# Patient Record
Sex: Male | Born: 1980
Health system: Southern US, Community
[De-identification: ages and names within clinical notes are randomized; demographics above are authoritative.]

## PROBLEM LIST (undated history)

## (undated) DIAGNOSIS — G4733 Obstructive sleep apnea (adult) (pediatric): Secondary | ICD-10-CM

## (undated) DIAGNOSIS — I10 Essential (primary) hypertension: Secondary | ICD-10-CM

## (undated) HISTORY — DX: Essential (primary) hypertension: I10

## (undated) HISTORY — PX: HIP SURGERY: SHX245

## (undated) HISTORY — DX: Obstructive sleep apnea (adult) (pediatric): G47.33

---

## 2000-11-27 ENCOUNTER — Emergency Department (HOSPITAL_COMMUNITY): Admission: EM | Admit: 2000-11-27 | Discharge: 2000-11-27 | Payer: Self-pay | Admitting: Emergency Medicine

## 2000-11-27 ENCOUNTER — Encounter: Payer: Self-pay | Admitting: Emergency Medicine

## 2004-05-18 ENCOUNTER — Emergency Department (HOSPITAL_COMMUNITY): Admission: EM | Admit: 2004-05-18 | Discharge: 2004-05-18 | Payer: Self-pay | Admitting: Emergency Medicine

## 2005-07-08 ENCOUNTER — Emergency Department (HOSPITAL_COMMUNITY): Admission: EM | Admit: 2005-07-08 | Discharge: 2005-07-08 | Payer: Self-pay | Admitting: Family Medicine

## 2006-01-09 ENCOUNTER — Ambulatory Visit: Payer: Self-pay | Admitting: Family Medicine

## 2007-11-21 ENCOUNTER — Emergency Department (HOSPITAL_COMMUNITY): Admission: EM | Admit: 2007-11-21 | Discharge: 2007-11-21 | Payer: Self-pay | Admitting: Emergency Medicine

## 2008-02-01 ENCOUNTER — Emergency Department (HOSPITAL_COMMUNITY): Admission: EM | Admit: 2008-02-01 | Discharge: 2008-02-01 | Payer: Self-pay | Admitting: Emergency Medicine

## 2008-02-03 ENCOUNTER — Emergency Department (HOSPITAL_COMMUNITY): Admission: EM | Admit: 2008-02-03 | Discharge: 2008-02-03 | Payer: Self-pay | Admitting: Family Medicine

## 2008-02-07 ENCOUNTER — Observation Stay (HOSPITAL_COMMUNITY): Admission: EM | Admit: 2008-02-07 | Discharge: 2008-02-09 | Payer: Self-pay | Admitting: Emergency Medicine

## 2008-10-26 ENCOUNTER — Emergency Department (HOSPITAL_COMMUNITY): Admission: EM | Admit: 2008-10-26 | Discharge: 2008-10-26 | Payer: Self-pay | Admitting: Emergency Medicine

## 2010-11-09 NOTE — H&P (Signed)
NAMEDEFORREST, BOGLE                 ACCOUNT NO.:  [id number]   MEDICAL RECORD NO.:  [id number]          PATIENT TYPE:  EMS   LOCATION:  ED                           FACILITY:  [facility]   PHYSICIAN:  Michelene Gardener, MD    DATE OF BIRTH:  May 19, 1981   DATE OF ADMISSION:  02/07/2008  DATE OF DISCHARGE:                              HISTORY & PHYSICAL   PRIMARY PHYSICIAN:  The patient is unassigned.   CHIEF COMPLAINT:  Increasing nausea, vomiting, sore throat and fever.   HISTORY OF PRESENT ILLNESS:  This is a 30 year old Caucasian male with  past medical history of recurrent strep pharyngitis who presented with  the above-mentioned complaint.  This patient has had recurrent strep  pharyngitis, and he gets strep almost every year.  The last time he was  diagnosed with strep was in May, and at that time he was treated with  penicillin and Phenergan, and he improved.  He started to have a sore  throat and fever and he went to urgent care on August 7.  At that time,  a strep test was done and it came to be negative.  He was given  penicillin, but he did not improve very well.  He came back on August 9,  where he had another strep test done, and that came to be negative.  Flagyl was added to his medications.  He did not tolerate Flagyl very  well, and he had nausea and vomiting with it.  For the last 2 days, he  has felt more febrile although he did not check his temperature.  He got  a lot of nausea and vomiting.  In the ER, his temperature was 100.5.  His white count was elevated at 21.3, and his potassium was low at 2.8.  A CT scan of his neck was done and it showed bilateral tonsillar  inflammation with low-density area compatible with phlegmon but no  definite abscess.  It also showed reactive adenopathy.   PAST MEDICAL HISTORY:  Significant for:  1. Recurrent pharyngitis and tonsillitis.  2. Obesity.  3. Tobacco abuse.   PAST SURGICAL HISTORY:  He had history of hip pinning at age  51.   ALLERGIES:  No known drug allergies.   CURRENT MEDICATIONS:  The patient does not take regular medications.  He  is only on Flagyl,  Phenergan and penicillin.  Those were started  recently for his sore throat.   SOCIAL HISTORY:  He smokes 1/2 pack a day and he has been smoking since  30 years old.  He denied alcohol drinking, and he denied recreational  drugs.   FAMILY HISTORY:  Noncontributory.   REVIEW OF SYSTEMS:  CONSTITUTIONAL:  Positive for fever on and off  associated with  fatigability.  EYES:  No blurred vision.  ENT:  No  tinnitus. RESPIRATORY:  Positive for occasional cough.  There are no  wheezes.  CARDIOVASCULAR:  No chest pain, no shortness of breath.  GASTROINTESTINAL:  Positive for nausea and vomiting.  There is no  abdominal pain.  There is no diarrhea.  GENITOURINARY:  No dysuria, no  hematuria.  ENDOCRINE:  No  polyuria, no nocturia.  HEMATOLOGIC:  No  bruises or bleeding.  ID:  No rash, no lesions.  NEURO:  No numbness, no  tingling.  The rest of the systems were reviewed and they were negative.   PHYSICAL EXAMINATION:  VITAL SIGNS:  Temperature is 100.5, blood  pressure 132/60, pulse 100, respiratory rate 20.  GENERAL APPEARANCE:  This is a young Caucasian male, not in acute  distress.  HEENT:  His conjunctivae are pink.  His pupils are equal and reactive to  light.  There is no ptosis.  Hearing is intact.  There is no ear  discharge or infection.  There is no nose infection or bleeding.  Oral  mucosa is dry.  There is mild pharyngeal erythema.  NECK:  Supple.  There is no JVD.  There is no carotid bruit.  There is  mild tenderness in his neck area.  CARDIOVASCULAR:  S1 and S2 are regular.  There are no murmurs.  RESPIRATORY:  He is breathing at 16 to 18.  There are no rales, no  rhonchi, and no wheezes.  ABDOMEN:  The abdomen is soft, nondistended.  No tenderness.  No  hepatosplenomegaly.  Bowel sounds are present.  EXTREMITIES:  Lower extremities  with no edema, no rash and no varicose  veins.  SKIN:  No rash and no erythema.  NEUROLOGIC:  Cranial nerves are intact from II-XII.  There are no motor  or sensory deficits.   LABORATORY RESULTS:  Sodium 135, potassium 2.8, chloride 99, bicarb 27,  glucose 140, BUN 7, creatinine 0.5, calcium 8.9, AST 23, ALT 27, WBC  21.3, hemoglobin 14.9, hematocrit 43.7, MCV 84, platelet count is 288.  A CT scan of the neck showed bilateral tonsil inflammation with small  low-density areas compatible with phlegmon, and there was no drainable  abscess.  There was bilateral reactive cervical adenopathy.   IMPRESSION:  1. Recurrent tonsillar infection with reactive adenopathy.  2. Leukocytosis secondary to number one in addition to dehydration.  3. Nausea and vomiting.  4. Hypokalemia secondary to vomiting.  5. Obesity.  6. Tobacco abuse.   PLAN:  1. Recurrent tonsillar infection.  This patient has recurrent strep      pharyngitis and tonsillitis, and he says he gets it almost once or      twice every year.  He had 3 strep tests done on May 27, August 7      and August 9 of this year, and all of them were negative.  Monospot      test was also negative.  His CT scan showed tonsillar infection      with phlegmon and no drainable abscess.  It also showed reactive      cervical adenopathy.  The patient not able to keep antibiotics down      at this time because of persistent vomiting.  He was treated      recently with penicillin and Flagyl, and I am not quite sure why he      was given Flagyl for his sore throat.  While in the hospital, I      will keep him on Unasyn IV.  I will send blood cultures x2.  He      stated that his urine color has been different, so I will get a      urinalysis and urine culture.  I will also send for H1N1 swab, and  meanwhile I will keep him in droplet isolation.  2. Leukocytosis.  This is most likely a combination of his current      infection in addition to  dehydration, and that will be treated with      both antibiotics and IV fluids, and we will repeat his white count      tomorrow.  3. Nausea and vomiting.  He is improving, so I will start him on a      liquid diet and then I will advance as needed.  I will also      continue symptomatic treatment with IV fluids and antiemetics.  4. Hypokalemia.  This is secondary to his vomiting.  I will give him      potassium and we will check magnesium and potassium level.  5. Tobacco abuse.  The patient was advised to quit.  I will put him on      a nicotine patch while in the hospital.   DISPOSITION:  This patient has recurrent tonsillitis, and he might  benefit from an ENT evaluation, and that can be done as an outpatient.   TOTAL ASSESSMENT TIME:  Fifty minutes.      Michelene Gardener, MD  Electronically Signed     NAE/MEDQ  D:  02/07/2008  T:  02/07/2008  Job:  [id number]

## 2010-11-12 NOTE — Assessment & Plan Note (Signed)
[facility]                             [facility] OFFICE NOTE   NAME:Troy Peterson, Troy Peterson                        MRN:          [id number]  DATE:01/09/2006                            DOB:          12-25-1980    A 30 year old white male new to the practice, here to establish.  Has  history of bad headaches which come on later in the day usually,  occasionally wakes up with them.  Sometimes they are in the back of the  head, sometimes in the front.  Can tell sometimes when they are coming on  but usually has no symptoms except for the headache itself.  Denies  photophobia.  Occasionally has nausea.  No odor intensity is noted.  Typically uses two Excedrin which allows them to go away.  Sometimes he goes  home and goes to bed.  He gets them about two or three times a week.   A second problem is bad indigestion all the time.  A third problem is he may  have had a tick bite on the back.  He was a patient of Dr. Charmian Muff in the  past, has not been seen in awhile.  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   PAST MEDICAL HISTORY:  Was admitted to the hospital for a week in the  seventh or eighth grade for having a hip problem with a pin placed for  frequent dislocations.  He denies rheumatic fever or scarlet fever.  No  hypertension, heart disease, asthma, pneumonia, diabetes, liver disease or  thyroid disease.  He has had strep fairly frequently.   ALLERGIES:  HE HAS NO KNOWN DRUG ALLERGIES.   Smokes a pack a day and has done so since 30 years of age.  He does not  drink or use drugs.  He is on no medicines regularly.   REVIEW OF SYSTEMS:  Significant for headaches two or three times a week,  occasional ringing of the ears.  His last abdominal exam was last month.  May have had an ulcer in the past, although Dr. Scotty Court did not think so.  He was having severe symptoms for awhile.  He has reflux disease and we  discussed prophylaxis significantly.  He has  three tattoos on his right  biceps that were done professionally.  Has rare nocturia.  Otherwise, head,  eyes, ears, nose and throat, teeth and gums, cardiorespiratory,  gastrointestinal and genitourinary systems are noncontributory.   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  Married since September 2001 with two children and one on  the way.   FAMILY HISTORY:  Father died at the age of 52 from a motor vehicle accident.  Mother is still alive at the age of 60.  One sister alive at 48.  Maternal  grandfather had prostate cancer and alcohol abuse.  There is no coronary  artery disease, high blood pressure, diabetes, cancer otherwise, depression  or stroke in the family.   His last tetanus shot is unknown.   PHYSICAL EXAMINATION:  He is a well-developed, well-nourished, 30 year old  white male in no acute distress.  Temperature of 97.9, pulse of 76, blood  pressure of 120/70, weight of 328, 74-1/2 inches, with no known drug  allergies.  Mildly obese.  HEENT:  Within normal limits.  NECK:  Without adenopathy.  THYROID:  Without nodularity.  LUNGS:  Clear to auscultation.  BACK:  Straight and nontender with no CVA tenderness.  HEART:  Regular rate and rhythm without murmur.  Pulses are 2+ throughout.  Carotids are without bruits.  CHEST:  Symmetrical with good excursion.  ABDOMEN:  Soft, nontender, normoactive bowel sounds, no masses.  Muscle strength is 5/5.  Range of motion, gait and mobility are normal.  EXTREMITIES:  Without clubbing, cyanosis or edema.  SKIN:  Significant for a papule with a foreign body in the middle of it  which I probed with a sharp instrument but could not get the foreign body  out.  I left it open to heal by secondary intention.   ASSESSMENT:  Health maintenance physical exam with obesity, tick bite at  site on the back with possible foreign body retained, headaches, possibly  stress versus migraine,  reflux disease, and nicotine abuse.   PLAN:  The bite site was probed.  I was unsuccessful in removing the foreign  body.  I put antibiotic cream and a sterile dressing over it.  Allowed him  to leave.  Told him if it swells or gets inflamed to come back.  Would like  to see him for next headache.  We discussed prophylaxis of his reflux  disease to include diet and weight loss.  Encouraged him at great length to  stop smoking.  Will give him a tetanus shot next time.  Return as needed.                                   Arta Silence, MD   RNS/MedQ  DD:  01/10/2006  DT:  01/10/2006  Job #:  [id number]

## 2011-03-23 LAB — POCT RAPID STREP A: Streptococcus, Group A Screen (Direct): NEGATIVE

## 2011-03-25 LAB — COMPREHENSIVE METABOLIC PANEL
ALT: 27
AST: 23
Albumin: 3.4 — ABNORMAL LOW
Alkaline Phosphatase: 66
BUN: 7
CO2: 27
Calcium: 8.9
Chloride: 99
Creatinine, Ser: 0.9
GFR calc Af Amer: >60
GFR calc non Af Amer: >60
Glucose, Bld: 140 — ABNORMAL HIGH
Potassium: 2.8 — ABNORMAL LOW
Sodium: 135
Total Bilirubin: 0.9
Total Protein: 7.1

## 2011-03-25 LAB — MAGNESIUM: Magnesium: 2.1

## 2011-03-25 LAB — DIFFERENTIAL
Basophils Absolute: 0.2 — ABNORMAL HIGH
Basophils Relative: 1
Eosinophils Absolute: 0.1
Eosinophils Relative: 0
Lymphocytes Relative: 14

## 2011-03-25 LAB — CBC
HCT: 43.7
Hemoglobin: 12.5 — ABNORMAL LOW
Hemoglobin: 14.9
MCHC: 34.1
MCHC: 34.1
MCV: 84
Platelets: 288
RBC: 4.36
RBC: 5.2
RDW: 13
WBC: 19.6 — ABNORMAL HIGH
WBC: 21.3 — ABNORMAL HIGH

## 2011-03-25 LAB — URINALYSIS, ROUTINE W REFLEX MICROSCOPIC
Bilirubin Urine: NEGATIVE
Glucose, UA: NEGATIVE
Hgb urine dipstick: NEGATIVE
Specific Gravity, Urine: >1.046 — ABNORMAL HIGH
pH: 6

## 2011-03-25 LAB — URINE CULTURE: Special Requests: NEGATIVE

## 2011-03-25 LAB — MONONUCLEOSIS SCREEN: Mono Screen: NEGATIVE

## 2011-03-25 LAB — CULTURE, BLOOD (ROUTINE X 2)

## 2011-03-25 LAB — BASIC METABOLIC PANEL
CO2: 31
Calcium: 8.6
Chloride: 101
GFR calc Af Amer: >60
Sodium: 137

## 2011-03-25 LAB — PHOSPHORUS: Phosphorus: 3.6

## 2011-03-25 LAB — POCT RAPID STREP A: Streptococcus, Group A Screen (Direct): NEGATIVE

## 2011-03-25 LAB — URINE MICROSCOPIC-ADD ON

## 2012-09-29 ENCOUNTER — Ambulatory Visit: Payer: Self-pay | Admitting: Physician Assistant

## 2013-01-23 ENCOUNTER — Ambulatory Visit (INDEPENDENT_AMBULATORY_CARE_PROVIDER_SITE_OTHER): Payer: 59 | Admitting: Family Medicine

## 2013-01-23 ENCOUNTER — Encounter: Payer: Self-pay | Admitting: Family Medicine

## 2013-01-23 VITALS — BP 138/80 | HR 76 | Temp 98.4°F | Ht 73.75 in | Wt 347.0 lb

## 2013-01-23 DIAGNOSIS — L739 Follicular disorder, unspecified: Secondary | ICD-10-CM

## 2013-01-23 DIAGNOSIS — L738 Other specified follicular disorders: Secondary | ICD-10-CM

## 2013-01-23 MED ORDER — DOXYCYCLINE HYCLATE 100 MG PO TABS: 100.0000 mg | ORAL_TABLET | Freq: Two times a day (BID) | ORAL | Status: DC

## 2013-01-23 NOTE — Progress Notes (Signed)
Lump on back of neck for about 2 days. Tender today if he presses on it.  No FCNAVD.  Feels well o/w.   Meds, vitals, and allergies reviewed.   ROS: See HPI.  Otherwise, noncontributory.  nad ncat Mmm rrr ctab Small area on back of neck with tenderness but not red.  Peripherally with folliculitis lesions noted.

## 2013-01-23 NOTE — Assessment & Plan Note (Signed)
With possible LA vs seb cyst vs very early abscess.  Doesn't need I&D now.  Start doxy, use warm compresses, call back if enlarging and needs reeval.  He agrees.

## 2013-01-23 NOTE — Patient Instructions (Addendum)
Start the antibiotics today and be careful about sun exposures.  Use a warm compress. If this isn't improving then let me know.  Take care.

## 2013-02-06 ENCOUNTER — Encounter: Payer: Self-pay | Admitting: Family Medicine

## 2013-02-06 ENCOUNTER — Ambulatory Visit (INDEPENDENT_AMBULATORY_CARE_PROVIDER_SITE_OTHER): Payer: 59 | Admitting: Family Medicine

## 2013-02-06 VITALS — BP 124/78 | HR 68 | Temp 98.1°F | Wt 350.5 lb

## 2013-02-06 DIAGNOSIS — Z13220 Encounter for screening for lipoid disorders: Secondary | ICD-10-CM

## 2013-02-06 DIAGNOSIS — L738 Other specified follicular disorders: Secondary | ICD-10-CM

## 2013-02-06 DIAGNOSIS — L739 Follicular disorder, unspecified: Secondary | ICD-10-CM

## 2013-02-06 DIAGNOSIS — Z131 Encounter for screening for diabetes mellitus: Secondary | ICD-10-CM

## 2013-02-06 DIAGNOSIS — Z23 Encounter for immunization: Secondary | ICD-10-CM

## 2013-02-06 NOTE — Progress Notes (Signed)
Was ~280 in HS, weight increased after stopping smoking.  Trucker.  Some meals on the road, "can go all day w/o eating."  Schedule if variable.  Disorder meal times.    Neck improved on doxy.  Area didn't drain.  Not ttp.  No fevers.    Due for tdap. Discussed.  Encouraged flu shot.   PMH and SH reviewed  ROS: See HPI, otherwise noncontributory.  Meds, vitals, and allergies reviewed.   GEN: nad, alert and oriented, overweight HEENT: mucous membranes moist NECK: supple w/o LA, normal inspection and palpation at prev infection site except for one area of nontender skin thickening that doesn't fell like a fluctuant mass CV: rrr.  PULM: ctab, no inc wob ABD: soft, +bs EXT: no edema SKIN: no acute rash

## 2013-02-06 NOTE — Patient Instructions (Addendum)
Come back for fasting labs.  I would get a flu shot each fall.   Try to get started on a regular meal plan.   Take care.  Call with questions.  Glad to see you.

## 2013-02-07 DIAGNOSIS — Z23 Encounter for immunization: Secondary | ICD-10-CM | POA: Insufficient documentation

## 2013-02-07 NOTE — Assessment & Plan Note (Signed)
D/w pt about weight loss via diet and exercise.  Needs to work on type and timing of food consumption.  Current with disorganized meal schedule.  He agrees.  Declined nutrition eval.  Call back as needed.  BP okay today.

## 2013-02-07 NOTE — Assessment & Plan Note (Signed)
Done today.

## 2013-02-07 NOTE — Assessment & Plan Note (Signed)
Resolved

## 2013-02-12 ENCOUNTER — Other Ambulatory Visit (INDEPENDENT_AMBULATORY_CARE_PROVIDER_SITE_OTHER): Payer: 59

## 2013-02-12 DIAGNOSIS — Z13220 Encounter for screening for lipoid disorders: Secondary | ICD-10-CM

## 2013-02-12 DIAGNOSIS — Z131 Encounter for screening for diabetes mellitus: Secondary | ICD-10-CM

## 2013-02-12 LAB — LIPID PANEL
Cholesterol: 152 mg/dL (ref 0–200)
VLDL: 21.8 mg/dL (ref 0.0–40.0)

## 2013-02-12 LAB — GLUCOSE, RANDOM: Glucose, Bld: 92 mg/dL (ref 70–99)

## 2013-02-13 ENCOUNTER — Ambulatory Visit: Payer: Self-pay | Admitting: Family Medicine

## 2013-02-13 ENCOUNTER — Encounter: Payer: Self-pay | Admitting: Family Medicine

## 2013-02-20 ENCOUNTER — Other Ambulatory Visit: Payer: 59

## 2014-01-22 ENCOUNTER — Ambulatory Visit: Payer: 59 | Admitting: Family Medicine

## 2014-01-22 ENCOUNTER — Ambulatory Visit (INDEPENDENT_AMBULATORY_CARE_PROVIDER_SITE_OTHER): Payer: 59 | Admitting: Family Medicine

## 2014-01-22 ENCOUNTER — Encounter: Payer: Self-pay | Admitting: Family Medicine

## 2014-01-22 VITALS — BP 158/84 | HR 76 | Temp 97.9°F | Wt 332.5 lb

## 2014-01-22 DIAGNOSIS — I10 Essential (primary) hypertension: Secondary | ICD-10-CM | POA: Insufficient documentation

## 2014-01-22 MED ORDER — LISINOPRIL 10 MG PO TABS
10.0000 mg | ORAL_TABLET | Freq: Every day | ORAL | Status: DC
Start: 2014-01-22 — End: 2015-05-25

## 2014-01-22 NOTE — Patient Instructions (Addendum)
[facility] at the [facility] diet and [email or web address].   Go to the lab on the way out.  We'll contact you with your lab report.  Start 10mg  of lisinopril a day and update me next week about your pressure.   Take care.

## 2014-01-22 NOTE — Progress Notes (Signed)
Pre visit review using our clinic review tool, if applicable. No additional management support is needed unless otherwise documented below in the visit note.  He has a new job pending.  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  He admits he was worried about this.  He tried cutting out salt recently.  He had lost some weight, some dietary changes.    Hypertension:    Using medication without problems or lightheadedness: yes Chest pain with exertion:no Edema:no Short of breath: with high exertion Average home BPs: elevated on outside checks, see above.    Meds, vitals, and allergies reviewed.   PMH and SH reviewed  ROS: See HPI.  Otherwise negative.    GEN: nad, alert and oriented HEENT: mucous membranes moist NECK: supple w/o LA CV: rrr. PULM: ctab, no inc wob EXT: no edema SKIN: no acute rash

## 2014-01-22 NOTE — Assessment & Plan Note (Signed)
He'll work on weight.  Add on lisinopril today, report back with BP in a few days.  Check BMET today.  He agrees. D/w pt about diet and exercise.

## 2014-01-23 ENCOUNTER — Telehealth: Payer: Self-pay | Admitting: Family Medicine

## 2014-01-23 LAB — BASIC METABOLIC PANEL
BUN: 20 mg/dL (ref 6–23)
CALCIUM: 9.7 mg/dL (ref 8.4–10.5)
CO2: 28 mEq/L (ref 19–32)
CREATININE: 0.9 mg/dL (ref 0.4–1.5)
Chloride: 99 mEq/L (ref 96–112)
GFR: 100.82 mL/min (ref >60.00)
Glucose, Bld: 108 mg/dL — ABNORMAL HIGH (ref 70–99)
Potassium: 4.4 mEq/L (ref 3.5–5.1)
Sodium: 134 mEq/L — ABNORMAL LOW (ref 135–145)

## 2014-01-23 NOTE — Telephone Encounter (Signed)
Left detailed message on voicemail.  

## 2014-01-23 NOTE — Telephone Encounter (Signed)
Patient says his BP is 118/64.  Patient took 1 tablet yesterday and 1 tablet this morning.  Dr. Para Marchuncan advised to cut Lisinopril in half daily and if symptoms are not improved, call for an appt.

## 2014-01-23 NOTE — Telephone Encounter (Signed)
Have him check his BP and notify us with the numbers.  Get on the schedule tomorrow if sx continue.  Thanks.

## 2014-01-23 NOTE — Telephone Encounter (Signed)
Caller: Oda/Patient; Phone: [phone]; Reason for Call: Recently started Lisinopril for HTN.  Patient reports feeling dizziness at times as well as chest pain at times.  Reports dizziness resolves itself within a few minutes.  Has also had a decreased appetite throughout the day due to nausea.  Please contact him with further instructions.

## 2014-12-16 IMAGING — CR DG ANKLE COMPLETE 3+V*L*
1 series · 5 of 5 positions shown · non-contrast
Comparison: none

REASON FOR EXAM: injury yesterday pain lt  foot and ankle
COMMENTS:

PROCEDURE:     DXR - DXR ANKLE LEFT COMPLETE  - September 29, 2012 [DATE]
RESULT:     Five views of the left ankle reveal the joint mortise to be
preserved. The talar dome is intact. There is no evidence of an acute
malleolar fracture. The overlying soft tissues are normal in appearance.

[Series 1: x ankle ap left · 0.14mm/px · 5 of 5 slices shown]
[im 1/5]
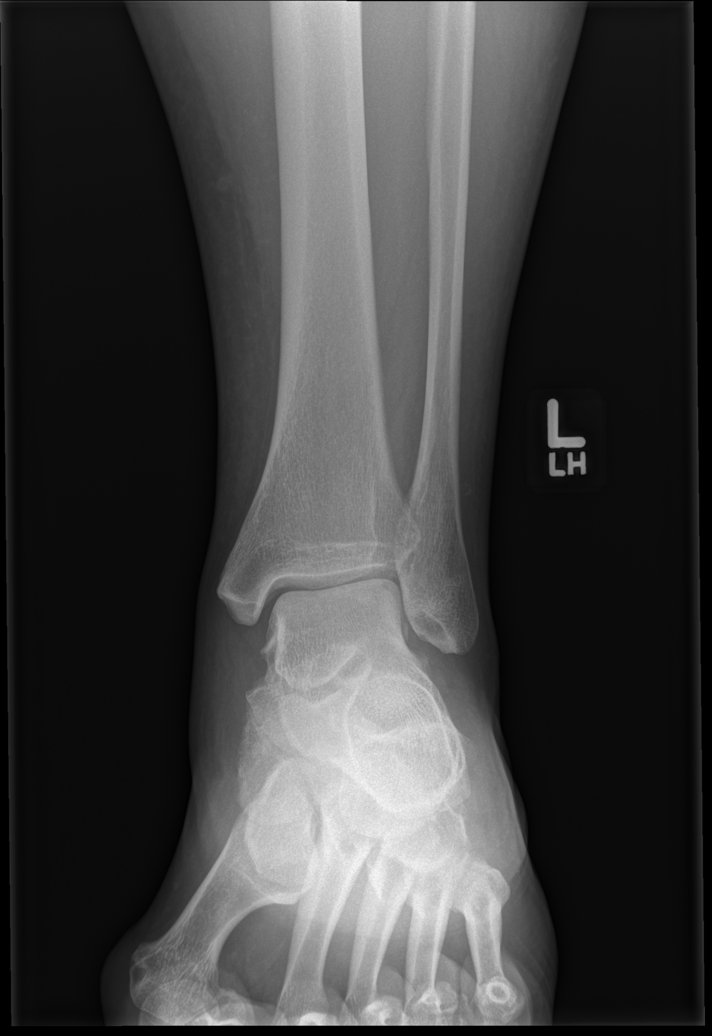
[im 2/5]
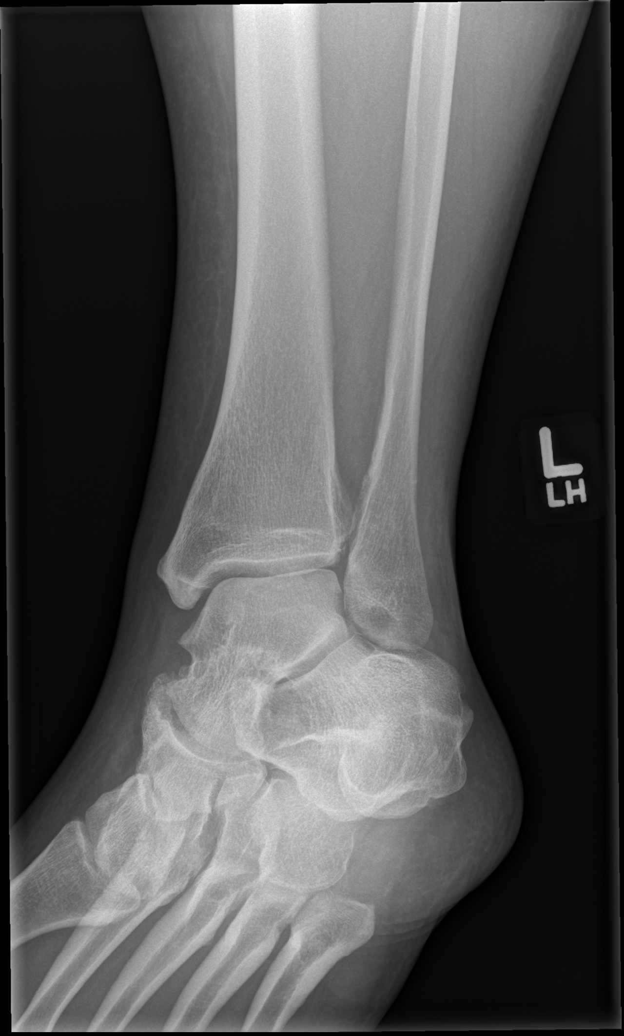
[im 3/5]
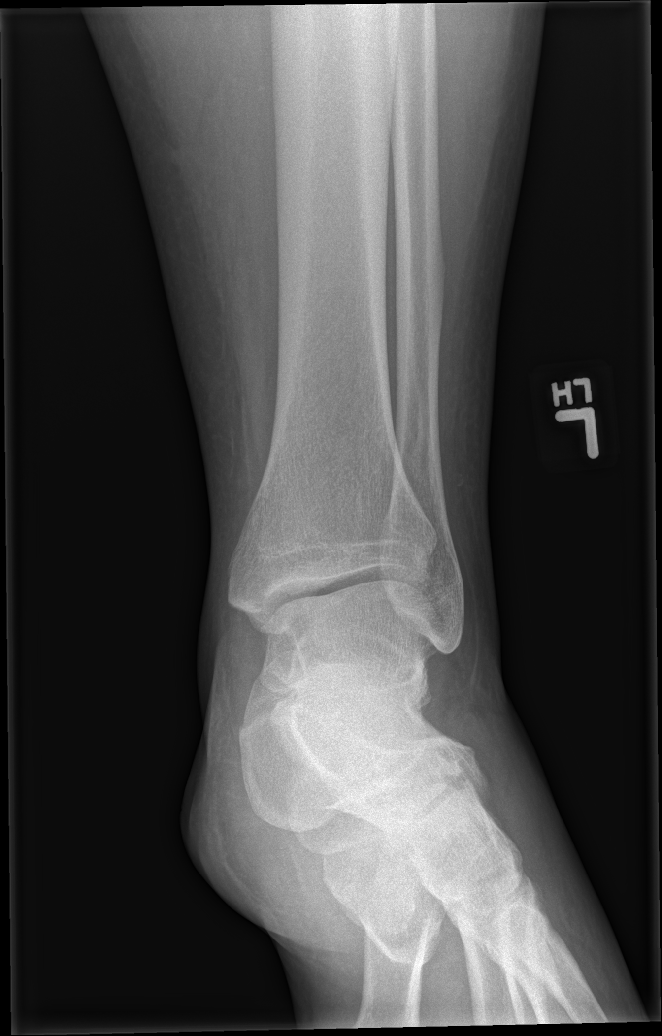
[im 4/5]
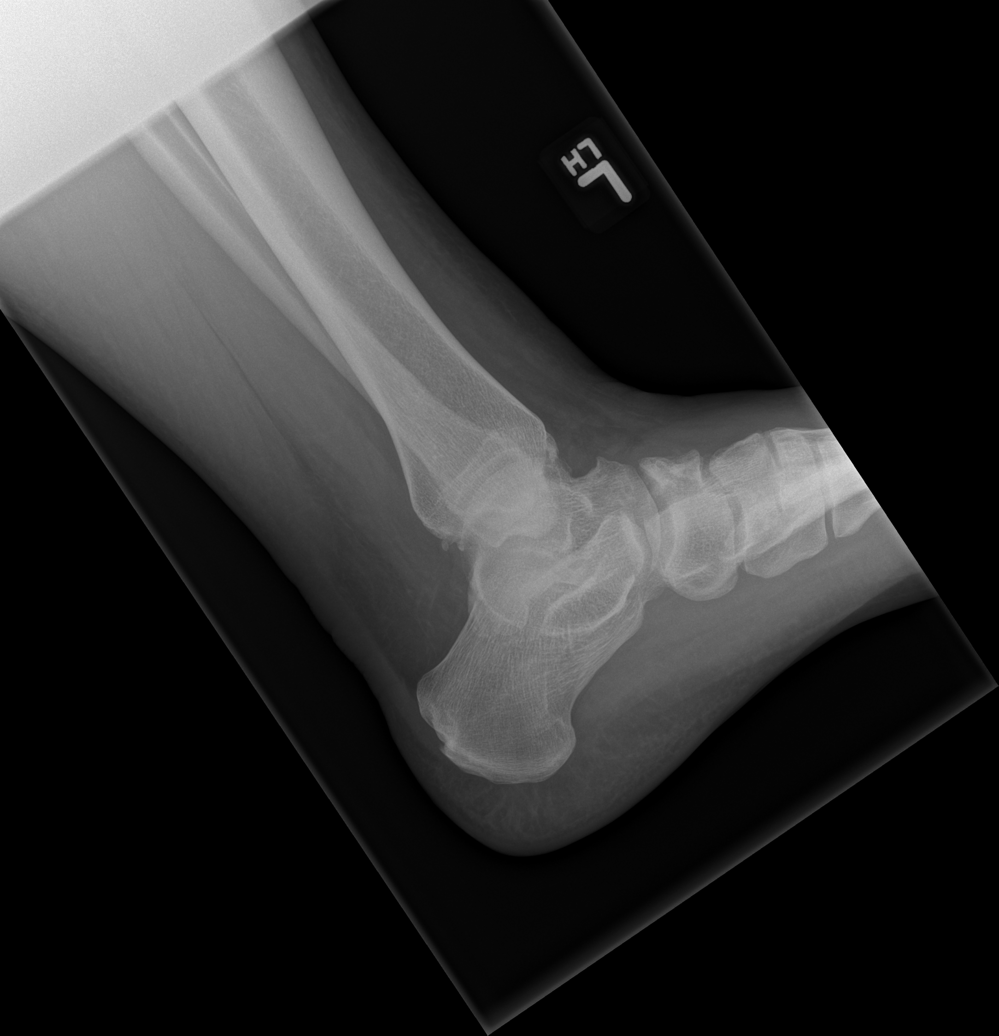
[im 5/5]
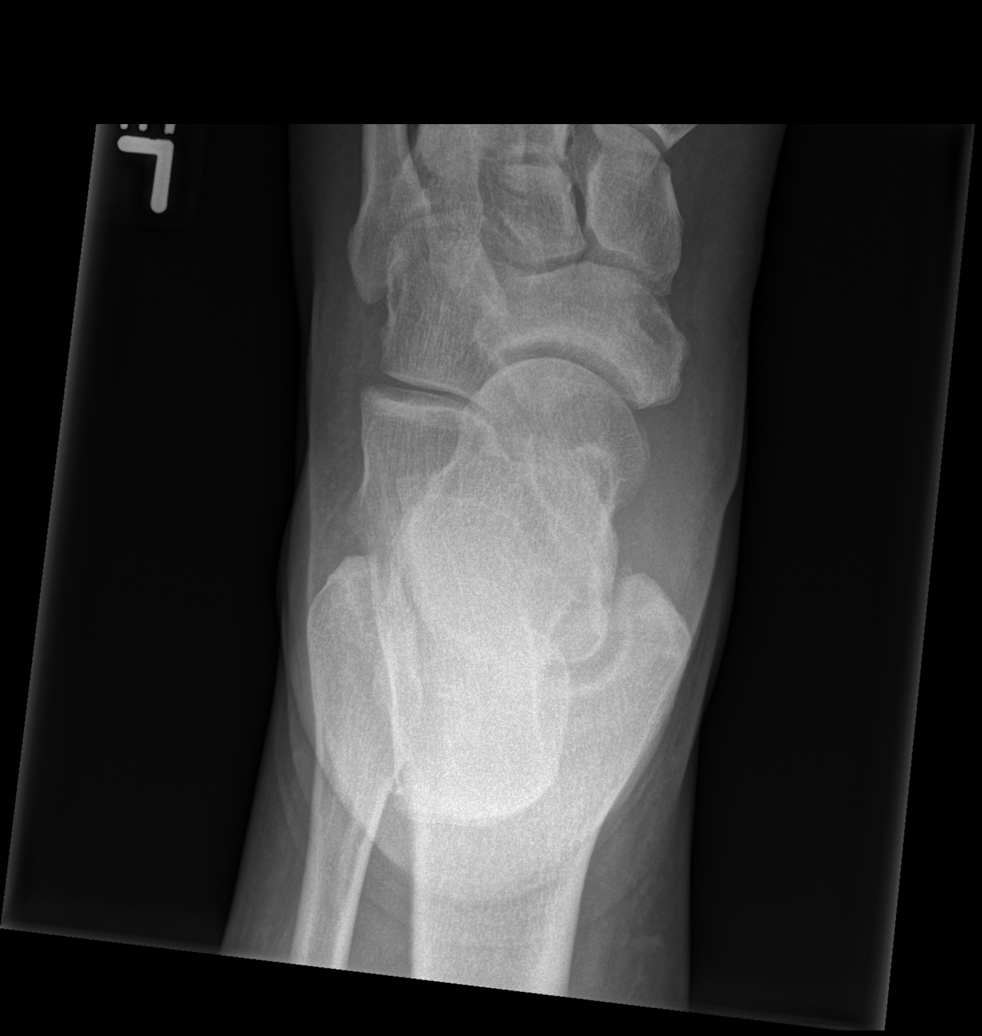

[5 of 5 positions shown; findings below may reference images not displayed]

IMPRESSION: There is no acute bony abnormality of the left ankle.

[REDACTED]

## 2015-05-25 ENCOUNTER — Other Ambulatory Visit: Payer: Self-pay

## 2015-05-25 MED ORDER — LISINOPRIL 5 MG PO TABS: 5.0000 mg | ORAL_TABLET | Freq: Every day | ORAL | Status: DC

## 2015-05-25 NOTE — Telephone Encounter (Signed)
rx sent.  Needs to schedule when possible.

## 2015-05-25 NOTE — Telephone Encounter (Signed)
Pt came to office but could not stay due to picking up child at bus stop. 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 Pt not sure when can schedule appt. Pt request lisinopril 5 mg to [facility]. Pt said he could not take the 10 mg dose and has been taking 1/2 pill. Pt request cb. No h/a, dizziness, CP or SOB.

## 2015-05-26 NOTE — Telephone Encounter (Signed)
Please schedule CPE for patient. Thank you!

## 2015-05-26 NOTE — Telephone Encounter (Signed)
Left message asking pt to call office  °

## 2015-06-02 NOTE — Telephone Encounter (Signed)
appointmetn 12/12 °

## 2015-06-08 ENCOUNTER — Ambulatory Visit (INDEPENDENT_AMBULATORY_CARE_PROVIDER_SITE_OTHER): Payer: 59 | Admitting: Family Medicine

## 2015-06-08 ENCOUNTER — Encounter: Payer: Self-pay | Admitting: Family Medicine

## 2015-06-08 VITALS — BP 130/88 | HR 81 | Temp 98.7°F | Wt 353.5 lb

## 2015-06-08 DIAGNOSIS — Z23 Encounter for immunization: Secondary | ICD-10-CM

## 2015-06-08 DIAGNOSIS — Z0000 Encounter for general adult medical examination without abnormal findings: Secondary | ICD-10-CM | POA: Diagnosis not present

## 2015-06-08 DIAGNOSIS — I10 Essential (primary) hypertension: Secondary | ICD-10-CM | POA: Diagnosis not present

## 2015-06-08 DIAGNOSIS — Z7189 Other specified counseling: Secondary | ICD-10-CM

## 2015-06-08 MED ORDER — LISINOPRIL 10 MG PO TABS: 10.0000 mg | ORAL_TABLET | Freq: Every day | ORAL | Status: DC

## 2015-06-08 NOTE — Patient Instructions (Addendum)
Total your calories for a typical day and then cut out 5%.  Ex: 2000 calories in a day- cut down to 1900.   Look at the [facility] diet (dietary approach to stopping hypertension). Go to the lab on the way out.  We'll contact you with your lab report. Take care.  Glad to see you.   I want to hear about your weight in about 1-2 months.  Please update me.

## 2015-06-08 NOTE — Progress Notes (Signed)
Pre visit review using our clinic review tool, if applicable. No additional management support is needed unless otherwise documented below in the visit note.  CPE- See plan.  Routine anticipatory guidance given to patient.  See health maintenance. Tetanus 2014 Flu today.  PNA and shingles not due Colon and prostate cancer screening not due Living will d/w pt.  Wife designated if patient were incapacitated.   Diet and exercise d/w pt.  He started back on his diet, cutting carbs and fast food.  Exercise is mainly at work.   Not snoring, no waking gasping for air.  No h/o OSA.  Not fatigued in the AM.   HIV prev neg per patient report.    Hypertension:    Using medication without problems or lightheadedness: yes, recently increased to 10mg  lisinopril per day, about 6 days ago.   Chest pain with exertion:yes Edema:yes Short of breath:yes  PMH and SH reviewed  Meds, vitals, and allergies reviewed.   ROS: See HPI.  Otherwise negative.    GEN: nad, alert and oriented HEENT: mucous membranes moist NECK: supple w/o LA CV: rrr. PULM: ctab, no inc wob ABD: soft, +bs EXT: no edema SKIN: no acute rash

## 2015-06-09 LAB — BASIC METABOLIC PANEL
BUN: 16 mg/dL (ref 6–23)
CHLORIDE: 103 meq/L (ref 96–112)
CO2: 30 meq/L (ref 19–32)
CREATININE: 0.89 mg/dL (ref 0.40–1.50)
Calcium: 9.5 mg/dL (ref 8.4–10.5)
GFR: 102.63 mL/min (ref >60.00)
Glucose, Bld: 88 mg/dL (ref 70–99)
Potassium: 4.3 mEq/L (ref 3.5–5.1)
Sodium: 139 mEq/L (ref 135–145)

## 2015-06-09 LAB — LIPID PANEL
CHOL/HDL RATIO: 4
CHOLESTEROL: 140 mg/dL (ref 0–200)
HDL: 33.8 mg/dL — ABNORMAL LOW (ref >39.00)
NONHDL: 106.58
Triglycerides: 213 mg/dL — ABNORMAL HIGH (ref 0.0–149.0)
VLDL: 42.6 mg/dL — ABNORMAL HIGH (ref 0.0–40.0)

## 2015-06-09 LAB — LDL CHOLESTEROL, DIRECT: Direct LDL: 78 mg/dL

## 2015-06-10 DIAGNOSIS — Z0000 Encounter for general adult medical examination without abnormal findings: Secondary | ICD-10-CM | POA: Insufficient documentation

## 2015-06-10 DIAGNOSIS — Z7189 Other specified counseling: Secondary | ICD-10-CM | POA: Insufficient documentation

## 2015-06-10 NOTE — Assessment & Plan Note (Signed)
Advised to total his calories for a typical day and then cut out 5%. Ex: 2000 calories in a day- cut down to 1900.  Look at the DASH diet.  I want update from patient re: his weight in about 1-2 months.

## 2015-06-10 NOTE — Assessment & Plan Note (Signed)
Tetanus 2014 Flu today.  PNA and shingles not due Colon and prostate cancer screening not due Living will d/w pt.  Wife designated if patient were incapacitated.   Diet and exercise d/w pt.  He started back on his diet, cutting carbs and fast food.  Exercise is mainly at work.   Not snoring, no waking gasping for air.  No h/o OSA.  Not fatigued in the AM.

## 2015-06-10 NOTE — Assessment & Plan Note (Signed)
Reasonable control with current dose, continue as is.  D/w pt about weight loss with diet and exercise.

## 2015-06-11 NOTE — Addendum Note (Signed)
Addended by: Annamarie MajorFUQUAY, Mitzie Marlar S on: 06/11/2015 08:17 AM   Modules accepted: Orders

## 2016-07-04 ENCOUNTER — Other Ambulatory Visit: Payer: Self-pay | Admitting: Family Medicine

## 2016-07-04 NOTE — Telephone Encounter (Signed)
Electronic refill request. Last Filled:    90 tablet 3 06/08/2015  Last office visit:   06/08/15  No visits in 2017, none scheduled upcoming.  Please advise.

## 2016-07-05 NOTE — Telephone Encounter (Signed)
Patient advised.

## 2016-07-05 NOTE — Telephone Encounter (Signed)
Due for CPE.  Please schedule.  Sent.  Thanks.

## 2016-07-25 ENCOUNTER — Other Ambulatory Visit (INDEPENDENT_AMBULATORY_CARE_PROVIDER_SITE_OTHER): Payer: 59

## 2016-07-25 ENCOUNTER — Other Ambulatory Visit: Payer: Self-pay | Admitting: Family Medicine

## 2016-07-25 DIAGNOSIS — I10 Essential (primary) hypertension: Secondary | ICD-10-CM | POA: Diagnosis not present

## 2016-07-25 LAB — COMPREHENSIVE METABOLIC PANEL
ALBUMIN: 4.2 g/dL (ref 3.5–5.2)
ALK PHOS: 70 U/L (ref 39–117)
ALT: 43 U/L (ref 0–53)
AST: 22 U/L (ref 0–37)
BILIRUBIN TOTAL: 0.5 mg/dL (ref 0.2–1.2)
BUN: 17 mg/dL (ref 6–23)
CO2: 31 mEq/L (ref 19–32)
Calcium: 9.9 mg/dL (ref 8.4–10.5)
Chloride: 102 mEq/L (ref 96–112)
Creatinine, Ser: 0.82 mg/dL (ref 0.40–1.50)
GFR: 112.1 mL/min (ref >60.00)
GLUCOSE: 91 mg/dL (ref 70–99)
POTASSIUM: 4.6 meq/L (ref 3.5–5.1)
Sodium: 137 mEq/L (ref 135–145)
TOTAL PROTEIN: 7.3 g/dL (ref 6.0–8.3)

## 2016-07-25 LAB — LIPID PANEL
CHOLESTEROL: 168 mg/dL (ref 0–200)
HDL: 35.5 mg/dL — AB (ref >39.00)
NonHDL: 132.68
Total CHOL/HDL Ratio: 5
Triglycerides: 219 mg/dL — ABNORMAL HIGH (ref 0.0–149.0)
VLDL: 43.8 mg/dL — AB (ref 0.0–40.0)

## 2016-07-25 LAB — LDL CHOLESTEROL, DIRECT: LDL DIRECT: 102 mg/dL

## 2016-07-28 ENCOUNTER — Encounter: Payer: Self-pay | Admitting: Family Medicine

## 2016-08-01 ENCOUNTER — Ambulatory Visit (INDEPENDENT_AMBULATORY_CARE_PROVIDER_SITE_OTHER): Payer: 59 | Admitting: Family Medicine

## 2016-08-01 ENCOUNTER — Encounter: Payer: Self-pay | Admitting: Family Medicine

## 2016-08-01 VITALS — BP 122/90 | HR 67 | Temp 98.4°F | Ht 73.5 in | Wt 326.0 lb

## 2016-08-01 DIAGNOSIS — Z23 Encounter for immunization: Secondary | ICD-10-CM | POA: Diagnosis not present

## 2016-08-01 DIAGNOSIS — Z0000 Encounter for general adult medical examination without abnormal findings: Secondary | ICD-10-CM | POA: Diagnosis not present

## 2016-08-01 DIAGNOSIS — I10 Essential (primary) hypertension: Secondary | ICD-10-CM

## 2016-08-01 DIAGNOSIS — R454 Irritability and anger: Secondary | ICD-10-CM

## 2016-08-01 MED ORDER — LISINOPRIL 10 MG PO TABS: 10.0000 mg | ORAL_TABLET | Freq: Every day | ORAL | 3 refills | Status: DC

## 2016-08-01 NOTE — Progress Notes (Signed)
Pre visit review using our clinic review tool, if applicable. No additional management support is needed unless otherwise documented below in the visit note. 

## 2016-08-01 NOTE — Patient Instructions (Signed)
Keep working on your weight.  Keep exercising.  Ask your wife if you have any abnormal breathing sounds at night.  If so, then we need to get you tested for sleep apnea.  We'll go from there.  Take care.  Glad to see you.

## 2016-08-01 NOTE — Progress Notes (Signed)
CPE- See plan.  Routine anticipatory guidance given to patient.  See health maintenance. Tetanus 2014 Flu today.  PNA and shingles not due Colon and prostate cancer screening not due Living will d/w pt.  Wife designated if patient were incapacitated.   Diet and exercise d/w pt.  He started back on his diet, cutting carbs and fast food.  Exercise is at the gym.  Weight is down with diet.  "I'm still batting with the water vs. soft drink thing."   HIV prev neg per patient report.    Hypertension:    Using medication without problems or lightheadedness: yes Chest pain with exertion:no Edema:no Short of breath:no  Irritable.  Easily irritated.  "I'm real moody."  Sx started to get worse "for a while."  Fatigue noted.  He feels weak in general, not focally.   No known snoring.  Not waking gasping for air.  ED noted.  No SI/HI.   His daughter had surgery today to have hardware removed from prev fixation.  I wished him and her the best.    PMH and SH reviewed  Meds, vitals, and allergies reviewed.   ROS: Per HPI.  Unless specifically indicated otherwise in HPI, the patient denies:  General: fever. Eyes: acute vision changes ENT: sore throat Cardiovascular: chest pain Respiratory: SOB GI: vomiting GU: dysuria Musculoskeletal: acute back pain Derm: acute rash Neuro: acute motor dysfunction Psych: worsening mood Endocrine: polydipsia Heme: bleeding Allergy: hayfever  GEN: nad, alert and oriented HEENT: mucous membranes moist NECK: supple w/o LA CV: rrr. PULM: ctab, no inc wob ABD: soft, +bs EXT: no edema SKIN: no acute rash

## 2016-08-02 DIAGNOSIS — R454 Irritability and anger: Secondary | ICD-10-CM | POA: Insufficient documentation

## 2016-08-02 NOTE — Assessment & Plan Note (Signed)
Needs weight loss with diet and exercise.

## 2016-08-02 NOTE — Assessment & Plan Note (Signed)
Tetanus 2014 Flu today.  PNA and shingles not due Colon and prostate cancer screening not due Living will d/w pt.  Wife designated if patient were incapacitated.   Diet and exercise d/w pt.  He started back on his diet, cutting carbs and fast food.  Exercise is at the gym.  Weight is down with diet.  "I'm still batting with the water vs. soft drink thing."   HIV prev neg per patient report.

## 2016-08-02 NOTE — Assessment & Plan Note (Signed)
No suicidal or homicidal intent. Okay for outpatient follow-up. He was asking about getting testosterone checked and possibly replaced. There was no point in checking testosterone previously, as it was not an early morning lab draw. Discussed with patient. He needs weight loss more than testosterone replacement, even if his testosterone level is low. Rationale discussed with patient. I did not check his level. I did not offer replacement. He does have some erectile dysfunction noted. Also likely exacerbated by weight. Unclear if he has sleep apnea. That could cause fatigue, exacerbating his blood pressure, lead to irritability. He will check on this and update me as needed. See after visit summary.

## 2016-08-02 NOTE — Assessment & Plan Note (Signed)
Continue work on diet and exercise. Continue ACE inhibitor for now. If lightheaded, cut dose in half and update me. He agrees. Labs discussed with patient.

## 2017-08-07 ENCOUNTER — Other Ambulatory Visit: Payer: Self-pay | Admitting: Family Medicine

## 2017-09-11 ENCOUNTER — Other Ambulatory Visit: Payer: Self-pay | Admitting: Family Medicine

## 2017-09-11 NOTE — Telephone Encounter (Signed)
Patient came by the office to check on refill for Lisinopril. Patient advised he is due for Physical and he made his appt for 09/18/17-he can only do Mondays. Please review refill. Patient has a few tablets left. thank Neta Mendsyou-Anastasiya V Hopkins, RMA CB [phone]

## 2017-09-11 NOTE — Telephone Encounter (Signed)
Refill sent.

## 2017-09-18 ENCOUNTER — Encounter: Payer: Self-pay | Admitting: Family Medicine

## 2017-09-18 ENCOUNTER — Ambulatory Visit (INDEPENDENT_AMBULATORY_CARE_PROVIDER_SITE_OTHER): Payer: 59 | Admitting: Family Medicine

## 2017-09-18 VITALS — BP 132/82 | HR 73 | Temp 98.3°F | Ht 74.0 in | Wt 334.5 lb

## 2017-09-18 DIAGNOSIS — I10 Essential (primary) hypertension: Secondary | ICD-10-CM

## 2017-09-18 DIAGNOSIS — Z7189 Other specified counseling: Secondary | ICD-10-CM

## 2017-09-18 DIAGNOSIS — Z0000 Encounter for general adult medical examination without abnormal findings: Secondary | ICD-10-CM

## 2017-09-18 DIAGNOSIS — R454 Irritability and anger: Secondary | ICD-10-CM

## 2017-09-18 DIAGNOSIS — L723 Sebaceous cyst: Secondary | ICD-10-CM

## 2017-09-18 LAB — COMPREHENSIVE METABOLIC PANEL
ALBUMIN: 4 g/dL (ref 3.5–5.2)
ALT: 24 U/L (ref 0–53)
AST: 21 U/L (ref 0–37)
Alkaline Phosphatase: 70 U/L (ref 39–117)
BILIRUBIN TOTAL: 0.7 mg/dL (ref 0.2–1.2)
BUN: 15 mg/dL (ref 6–23)
CALCIUM: 9 mg/dL (ref 8.4–10.5)
CO2: 29 mEq/L (ref 19–32)
CREATININE: 0.75 mg/dL (ref 0.40–1.50)
Chloride: 104 mEq/L (ref 96–112)
GFR: 123.5 mL/min (ref >60.00)
Glucose, Bld: 96 mg/dL (ref 70–99)
Potassium: 4 mEq/L (ref 3.5–5.1)
Sodium: 140 mEq/L (ref 135–145)
TOTAL PROTEIN: 7.6 g/dL (ref 6.0–8.3)

## 2017-09-18 LAB — LIPID PANEL
CHOL/HDL RATIO: 3
CHOLESTEROL: 160 mg/dL (ref 0–200)
HDL: 46.5 mg/dL (ref >39.00)
LDL Cholesterol: 94 mg/dL (ref 0–99)
NonHDL: 113.29
TRIGLYCERIDES: 96 mg/dL (ref 0.0–149.0)
VLDL: 19.2 mg/dL (ref 0.0–40.0)

## 2017-09-18 LAB — TSH: TSH: 1.75 u[IU]/mL (ref 0.35–4.50)

## 2017-09-18 MED ORDER — LISINOPRIL 10 MG PO TABS: 10.0000 mg | ORAL_TABLET | Freq: Every day | ORAL | 3 refills | Status: DC

## 2017-09-18 NOTE — Patient Instructions (Addendum)
Go to the lab on the way out.  We'll contact you with your lab report. If the spot gets bigger or red or painful, then ask for an appointment for me to work on it.  I can drain it.  If your labs are fine, then venlafaxine may be a good option for your mood.  Take care.  Glad to see you.

## 2017-09-18 NOTE — Progress Notes (Signed)
CPE- See plan.  Routine anticipatory guidance given to patient.  See health maintenance.  The possibility exists that previously documented standard health maintenance information may have been brought forward from a previous encounter into this note.  If needed, that same information has been updated to reflect the current situation based on today's encounter.   Tetanus 2014 Flu encouraged.   PNA and shingles not due  Colon and prostate cancer screening not due. Living will d/w pt. Wife designated if patient were incapacitated.  Diet and exercise d/w pt. both tend to go together, ie will adhere to both and then gradually get out of the habit.   D/w pt about options for diet/exercise.  He is going to try to cut back on sodas.   HIV prev neg per patient report.   Hypertension:    Using medication without problems or lightheadedness: yes Chest pain with exertion:no Edema:no Short of breath:no Labs pending.    Mood d/w pt.  "I have mood swings."  Anxious, to the point of nausea, episodically.  Can be more easy to get irritated.  Variable level of motivation.  No SI/HI.  He can get angry w/o a clear trigger "but then 20 minutes later I'm back to normal."  He had to stop watching the news, since it was irritating him.  Sx got worse in the last year or so.  He is still getting along well with his family.  He is discussed it with his wife.  Sleep d/w pt, wife hasn't apnea or snoring.  He doesn't have h/o mania.    H/o lesion on the back of the neck.  Prev resolved with doxy about 5 years ago.  Gradually bigger in the last few months.  Never drained.  Not tender.  Notes it only if he touches it.  No trauma.    PMH and SH reviewed  Meds, vitals, and allergies reviewed.   ROS: Per HPI.  Unless specifically indicated otherwise in HPI, the patient denies:  General: fever. Eyes: acute vision changes ENT: sore throat Cardiovascular: chest pain Respiratory: SOB GI: vomiting GU:  dysuria Musculoskeletal: acute back pain Derm: acute rash Neuro: acute motor dysfunction Psych: worsening mood Endocrine: polydipsia Heme: bleeding Allergy: hayfever  GEN: nad, alert and oriented HEENT: mucous membranes moist NECK: supple w/o LA, likely non-irritated nontender sebaceous cyst noted on the posterior neck. CV: rrr. PULM: ctab, no inc wob ABD: soft, +bs EXT: no edema SKIN: no acute rash

## 2017-09-19 DIAGNOSIS — L723 Sebaceous cyst: Secondary | ICD-10-CM | POA: Insufficient documentation

## 2017-09-19 MED ORDER — VENLAFAXINE HCL 37.5 MG PO TABS: ORAL_TABLET | ORAL | 3 refills | Status: DC

## 2017-09-19 NOTE — Assessment & Plan Note (Signed)
Discussed with patient about diet and exercise. Blood pressure controlled.  No change in med.  See notes on labs.

## 2017-09-19 NOTE — Assessment & Plan Note (Signed)
On the posterior neck.  Nontender.  If significantly enlarging or if painful I can drain if needed.  Discussed with patient.  He will update me as needed.

## 2017-09-19 NOTE — Assessment & Plan Note (Signed)
With anxiety but not suicidal or homicidal.  Still okay for outpatient follow-up.  We talked about trigger avoidance, he is stopped watching the news since that was aggravating him.  Still safe at home.  He does not have known sleep apnea.  He has no history of mania.  Assuming his labs are normal he would likely be reasonable to start low-dose of Effexor and then update me as needed.

## 2017-09-19 NOTE — Assessment & Plan Note (Signed)
Tetanus 2014 Flu encouraged.   PNA and shingles not due  Colon and prostate cancer screening not due. Living will d/w pt. Wife designated if patient were incapacitated.  Diet and exercise d/w pt. both tend to go together, ie will adhere to both and then gradually get out of the habit.   D/w pt about options for diet/exercise.  He is going to try to cut back on sodas.  HIV prev neg per patient report.

## 2017-09-19 NOTE — Assessment & Plan Note (Signed)
Living will d/w pt.  Wife designated if patient were incapacitated.   ?

## 2017-09-19 NOTE — Assessment & Plan Note (Signed)
Discussed with patient about diet and exercise. 

## 2017-10-20 DIAGNOSIS — L03818 Cellulitis of other sites: Secondary | ICD-10-CM | POA: Diagnosis not present

## 2018-01-08 ENCOUNTER — Ambulatory Visit: Payer: 59 | Admitting: Family Medicine

## 2018-01-08 ENCOUNTER — Encounter: Payer: Self-pay | Admitting: Family Medicine

## 2018-01-08 VITALS — BP 136/84 | HR 54 | Temp 97.7°F | Ht 74.0 in | Wt 331.8 lb

## 2018-01-08 DIAGNOSIS — R454 Irritability and anger: Secondary | ICD-10-CM

## 2018-01-08 DIAGNOSIS — Z9189 Other specified personal risk factors, not elsewhere classified: Secondary | ICD-10-CM | POA: Diagnosis not present

## 2018-01-08 DIAGNOSIS — L723 Sebaceous cyst: Secondary | ICD-10-CM

## 2018-01-08 MED ORDER — VENLAFAXINE HCL 37.5 MG PO TABS: 37.5000 mg | ORAL_TABLET | Freq: Every day | ORAL | Status: DC

## 2018-01-08 NOTE — Patient Instructions (Addendum)
Ask about a procedure visit on a Monday for me to work on the spot on your neck.   We will call about your referral.  Shirlee LimerickMarion or Alvina Chounastasiya will call you if you don't see one of them on the way out.  Take care.  Glad to see you.

## 2018-01-08 NOTE — Progress Notes (Signed)
He is taking venlafaxine at night, 37.5mg  at night, no ADE on med.  Sig help with med.  Mood better.    Lesion on the back, had been inflamed prev.  It prev got better with abx, got smaller, then has gradually gotten bigger.    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  He was told to come in re: OSA testing.  He doesn't have known apnea.  He isn't waking tired.  He is likely going to need eval to get his DOT approved.  He isn't tired in the day.  H/o HTN noted.   OSA path/phys d/w pt.   Meds, vitals, and allergies reviewed.   ROS: Per HPI unless specifically indicated in ROS section   GEN: nad, alert and oriented HEENT: mucous membranes moist NECK: supple w/o LA, likely sebaceous cyst on the back of the neck.  Not inflamed.  Not red. 17" neck CV: rrr PULM: ctab, no inc wob EXT: no edema SKIN: no acute rash

## 2018-01-09 DIAGNOSIS — Z9189 Other specified personal risk factors, not elsewhere classified: Secondary | ICD-10-CM | POA: Insufficient documentation

## 2018-01-09 DIAGNOSIS — G4733 Obstructive sleep apnea (adult) (pediatric): Secondary | ICD-10-CM

## 2018-01-09 HISTORY — DX: Obstructive sleep apnea (adult) (pediatric): G47.33

## 2018-01-09 NOTE — Assessment & Plan Note (Signed)
Refer for testing.  At risk due to obesity.  Discussed with patient about weight loss and obstructive sleep apnea pathophysiology and testing.

## 2018-01-09 NOTE — Assessment & Plan Note (Signed)
Improved on venlafaxine.  Continue as is.  No adverse effect on medication.  Mood is better.

## 2018-01-09 NOTE — Assessment & Plan Note (Signed)
Return for I&D.  He agrees.  He did not have time to do it at the office visit today.  Not emergent. D/w pt.  He agrees.

## 2018-01-18 ENCOUNTER — Encounter: Payer: Self-pay | Admitting: Internal Medicine

## 2018-01-18 ENCOUNTER — Ambulatory Visit (INDEPENDENT_AMBULATORY_CARE_PROVIDER_SITE_OTHER): Payer: 59 | Admitting: Internal Medicine

## 2018-01-18 VITALS — BP 132/86 | HR 80 | Ht 74.0 in | Wt 337.0 lb

## 2018-01-18 DIAGNOSIS — G4719 Other hypersomnia: Secondary | ICD-10-CM

## 2018-01-18 NOTE — Patient Instructions (Signed)

## 2018-01-18 NOTE — Progress Notes (Signed)
[facility] Consultation      Assessment and Plan:  Excessive Daytime sleepiness, obesity..  - We will send for sleep study.  Orders Placed This Encounter  Procedures  . Home sleep test   Return in about 2 months (around 03/21/2018).   Date: 01/18/2018  MRN# [id number] Troy Peterson Aug 01, 1980  Referring Physician: DOT doctor, Dr. Para Marchuncan.   Troy Peterson is a 37 y.o. old male seen in consultation for chief complaint of:    Chief Complaint  Patient presents with  . Consult    HPI:   He went for his DOT physical and was found to have a big neck and obesity, ESS is 5 today.  He drives a gas truck short distances.  Patient does not think that he snores at night, he feels that if he get a good night sleep he does okay during the day, however if he does not sleep adequately he can be sleepy during the day.  Denies trouble concentrating.  He tells me his wife is on CPAP, and their insurance does not cover dental device.  PMHX:   Past Medical History:  Diagnosis Date  . Hypertension    Surgical Hx:  Past Surgical History:  Procedure Laterality Date  . HIP SURGERY  6th grade   pins in right hip   Family Hx:  Family History  Problem Relation Age of Onset  . Stroke Mother   . Colon cancer Neg Hx   . Prostate cancer Neg Hx    Social Hx:   Social History   Tobacco Use  . Smoking status: Former Games developermoker  . Smokeless tobacco: Never Used  . Tobacco comment: quit 2011  Substance Use Topics  . Alcohol use: No  . Drug use: No   Medication:    Current Outpatient Medications:  .  ibuprofen (ADVIL,MOTRIN) 200 MG tablet, Take 200 mg by mouth as needed for pain., Disp: , Rfl:  .  lisinopril (PRINIVIL,ZESTRIL) 10 MG tablet, Take 1 tablet (10 mg total) by mouth daily., Disp: 90 tablet, Rfl: 3 .  venlafaxine (EFFEXOR) 37.5 MG tablet, Take 1 tablet (37.5 mg total) by mouth at bedtime., Disp: , Rfl:    Allergies:  Patient has no known allergies.  Review  of Systems: Gen:  Denies  fever, sweats, chills HEENT: Denies blurred vision, double vision. bleeds, sore throat Cvc:  No dizziness, chest pain. Resp:   Denies cough or sputum production, shortness of breath Gi: Denies swallowing difficulty, stomach pain. Gu:  Denies bladder incontinence, burning urine Ext:   No Joint pain, stiffness. Skin: No skin rash,  hives  Endoc:  No polyuria, polydipsia. Psych: No depression, insomnia. Other:  All other systems were reviewed with the patient and were negative other that what is mentioned in the HPI.   Physical Examination:   VS: BP 132/86 (BP Location: Left Arm, Cuff Size: Large)   Pulse 80   Ht 6\' 2"  (1.88 m)   Wt (!) 337 lb (152.9 kg)   SpO2 97%   BMI 43.27 kg/m   General Appearance: No distress  Neuro:without focal findings,  speech normal,  HEENT: PERRLA, EOM intact.  [place] 2. Pulmonary: normal breath sounds, No wheezing.  CardiovascularNormal S1,S2.  No m/r/g.   Abdomen: Benign, Soft, non-tender. Renal:  No costovertebral tenderness  GU:  No performed at this time. Endoc: No evident thyromegaly, no signs of acromegaly. Skin:   warm, no rashes, no ecchymosis  Extremities: normal, no cyanosis, clubbing.  Other findings:    LABORATORY PANEL:   CBC No results for input(s): WBC, HGB, HCT, PLT in the last 168 hours. ------------------------------------------------------------------------------------------------------------------  Chemistries  No results for input(s): NA, K, CL, CO2, GLUCOSE, BUN, CREATININE, CALCIUM, MG, AST, ALT, ALKPHOS, BILITOT in the last 168 hours.  Invalid input(s): GFRCGP ------------------------------------------------------------------------------------------------------------------  Cardiac Enzymes No results for input(s): TROPONINI in the last 168 hours. ------------------------------------------------------------  RADIOLOGY:  No results found.     Thank  you for the consultation and  for allowing [facility] [place] [facility], Critical Care to assist in the care of your patient. Our recommendations are noted above.  Please contact us if we can be of further service.   Wells Guiles, M.D., F.C.C.P.  Board Certified in Internal Medicine, Pulmonary Medicine, Critical Care Medicine, and Sleep Medicine.   Pulmonary and Critical Care Office Number: [phone]   01/18/2018

## 2018-01-30 ENCOUNTER — Other Ambulatory Visit: Payer: Self-pay | Admitting: Family Medicine

## 2018-02-05 ENCOUNTER — Institutional Professional Consult (permissible substitution): Payer: 59 | Admitting: Internal Medicine

## 2018-02-12 ENCOUNTER — Ambulatory Visit: Payer: 59 | Admitting: Family Medicine

## 2018-02-13 ENCOUNTER — Encounter: Payer: Self-pay | Admitting: Internal Medicine

## 2018-02-13 DIAGNOSIS — G4719 Other hypersomnia: Secondary | ICD-10-CM

## 2018-02-13 DIAGNOSIS — G4733 Obstructive sleep apnea (adult) (pediatric): Secondary | ICD-10-CM | POA: Diagnosis not present

## 2018-02-19 ENCOUNTER — Ambulatory Visit: Payer: 59 | Admitting: Family Medicine

## 2018-02-19 ENCOUNTER — Encounter: Payer: Self-pay | Admitting: Family Medicine

## 2018-02-19 ENCOUNTER — Telehealth: Payer: Self-pay | Admitting: *Deleted

## 2018-02-19 VITALS — BP 130/90 | HR 61 | Temp 98.4°F | Ht 74.0 in | Wt 339.0 lb

## 2018-02-19 DIAGNOSIS — G4733 Obstructive sleep apnea (adult) (pediatric): Secondary | ICD-10-CM | POA: Insufficient documentation

## 2018-02-19 DIAGNOSIS — L723 Sebaceous cyst: Secondary | ICD-10-CM

## 2018-02-19 MED ORDER — VENLAFAXINE HCL 37.5 MG PO TABS: 37.5000 mg | ORAL_TABLET | Freq: Every day | ORAL | Status: DC

## 2018-02-19 NOTE — Assessment & Plan Note (Signed)
I and D done.  Routine postprocedure instructions d/w pt- remove packing tomorrow night. keep area clean and bandaged, follow up if concerns/spreading erythema/pain. He agrees.   See avs.

## 2018-02-19 NOTE — Patient Instructions (Signed)
Likely a residual thick wall from prior sebaceous cyst.   Pull the packing tomorrow night.  Keep it clean and covered in the meantime and has needed.  It may continue to drain a little after the packing is out.  Wash with soapy water after removing packing.  Take care.  Glad to see you.

## 2018-02-19 NOTE — Assessment & Plan Note (Signed)
Per pulmonary.  I'll defer.  He agrees.

## 2018-02-19 NOTE — Progress Notes (Signed)
He had f/u with pulmonary and I'll defer.  He agrees.     I&D  Meds, vitals, and allergies reviewed.   Indication: sebaceous chest.    Pt complaints of: local swelling  Location: L posterior neck   Size:2cm  Informed consent obtained.  Pt aware of risks not limited to but including infection, bleeding, damage to near by organs.  Prep: etoh/betadine  Anesthesia: 1%lidocaine with epi, good effect  Incision made with #11 blade, minimal drainage.    Wound explored and loculations removed  Wound packed with iodoform gauze  Tolerated well, no complications.

## 2018-02-19 NOTE — Telephone Encounter (Signed)
Pt aware of results of hst. Orders placed Nothing further needed.

## 2018-02-20 ENCOUNTER — Other Ambulatory Visit: Payer: Self-pay | Admitting: *Deleted

## 2018-02-20 MED ORDER — VENLAFAXINE HCL 37.5 MG PO TABS: 37.5000 mg | ORAL_TABLET | Freq: Every day | ORAL | 2 refills | Status: DC

## 2018-03-05 ENCOUNTER — Telehealth: Payer: Self-pay | Admitting: Internal Medicine

## 2018-03-05 NOTE — Telephone Encounter (Signed)
Called and spoke with Carpenter at [facility]. Per Barbara Cower they have attempted to contact patient but was advised that they had the incorrect number. Letter was mailed from [facility].  Gave [facility] pt's new number and patient was advised to contact [facility] at [phone] for CPAP set up.  Pt wrote phone number down and was planning on contacting [facility]. Nothing else needed at this point. Rhonda J Cobb

## 2018-03-05 NOTE — Telephone Encounter (Signed)
Pt states he has not heard anything from [facility] regarding his sleep machine.

## 2018-03-12 DIAGNOSIS — G4733 Obstructive sleep apnea (adult) (pediatric): Secondary | ICD-10-CM | POA: Diagnosis not present

## 2018-03-13 ENCOUNTER — Other Ambulatory Visit: Payer: Self-pay | Admitting: Family Medicine

## 2018-03-14 NOTE — Telephone Encounter (Signed)
Please verify with patient about dose.  I thought he was only taking it one tab a day.  Thanks.

## 2018-03-14 NOTE — Telephone Encounter (Signed)
Electronic refill request Last office visit 02/19/18 Last refill 02/20/18 #90/2 which shows once a day The refill request shows two times a day Please confirm directions

## 2018-03-15 NOTE — Telephone Encounter (Signed)
Lm on pts vm requesting a call back to confirm dosing

## 2018-03-16 NOTE — Telephone Encounter (Signed)
Patient is taking Venlafaxine 37.5 mg once a day.  Patient does not need refill at this time.  RF denied per patient request.

## 2018-03-23 ENCOUNTER — Telehealth: Payer: Self-pay | Admitting: *Deleted

## 2018-03-23 NOTE — Telephone Encounter (Signed)
Pt is not compliant on cpap therapy. Started 02/21/18 and has only worn 11/30 days. ? 9/30 f/u appt.

## 2018-03-23 NOTE — Telephone Encounter (Signed)
Pt is returning your call

## 2018-03-23 NOTE — Telephone Encounter (Signed)
Spoke with patient to reschedule appt from 9/30. Pt says he started cpap therapy on 9/16. Called and left message for Sh[facility] Nimrod [facility]) to find out when therapy was suppose to have started. Pt rescheduled f/u 10/28.

## 2018-03-26 ENCOUNTER — Ambulatory Visit: Payer: 59 | Admitting: Internal Medicine

## 2018-04-11 DIAGNOSIS — G4733 Obstructive sleep apnea (adult) (pediatric): Secondary | ICD-10-CM | POA: Diagnosis not present

## 2018-04-12 ENCOUNTER — Encounter: Payer: Self-pay | Admitting: Internal Medicine

## 2018-04-22 NOTE — Progress Notes (Signed)
[facility] Pulmonary Medicine     Assessment and Plan:  Obstructive sleep apnea. - Appears to be doing well with CPAP, he is more awake during the day, wearing CPAP every night for about 7 hours per night. - Encouraged to continue to use CPAP every night.  Essential hypertension, depression, morbid obesity. - Sleep apnea can contribute to above issues, therefore treatment with CPAP is important part of their management.    Date: 04/22/2018  MRN# [id number] Troy Peterson 09/09/1980   Troy Peterson is a 37 y.o. old male seen in follow up for chief complaint of  Chief Complaint  Patient presents with  . Follow-up    going well  . Sleep Apnea    CPAP compliance     HPI:  The patient went for his DOT physical and was found to have a big neck. He has been doing well with his CPAP, he is wearing it every night and is feeling more awake during the day.  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   He went for his DOT physical and was found to have a big neck and obesity, ESS is 5 today.  He drives a gas truck short distances.  Patient does not think that he snores at night, he feels that if he get a good night sleep he does okay during the day, however if he does not sleep adequately he can be sleepy during the day.  Denies trouble concentrating.  He tells me his wife is on CPAP, and their insurance does not cover dental device.  **Download data 03/16/2018-04/22/2018>> usage greater than 4 hours is 30/30 days.  Average usage on days used 6 hours 52 minutes.  Pressure range 5-15.  Median pressure 6, 95th percentile pressure 10, maximum pressure 11.  Residual AHI 0.6.  Overall this shows excellent compliance with CPAP with excellent control of obstructive sleep apnea. **02/13/18 HST >> Mild OSA with AHI of 7  Medication:    Current Outpatient Medications:  .  ibuprofen (ADVIL,MOTRIN) 200 MG tablet, Take 200 mg by mouth as needed for pain., Disp: , Rfl:  .  lisinopril  (PRINIVIL,ZESTRIL) 10 MG tablet, Take 1 tablet (10 mg total) by mouth daily., Disp: 90 tablet, Rfl: 3 .  venlafaxine (EFFEXOR) 37.5 MG tablet, Take 1 tablet (37.5 mg total) by mouth at bedtime., Disp: 90 tablet, Rfl: 2   Allergies:  Patient has no known allergies.   Review of Systems:  Constitutional: Feels well. Cardiovascular: Denies chest pain, exertional chest pain.  Pulmonary: Denies hemoptysis, pleuritic chest pain.   The remainder of systems were reviewed and were found to be negative other than what is documented in the HPI.   Physical Examination:   VS: BP (!) 160/90 (BP Location: Right Arm, Cuff Size: Normal)   Pulse 62   Ht 6\' 2"  (1.88 m)   Wt (!) 332 lb (150.6 kg)   SpO2 98%   BMI 42.63 kg/m   General Appearance: No distress  Neuro:without focal findings, mental status, speech normal, alert and oriented HEENT: PERRLA, EOM intact Pulmonary: No wheezing, No rales  CardiovascularNormal S1,S2.  No m/r/g.  Abdomen: Benign, Soft, non-tender, No masses Renal:  No costovertebral tenderness  GU:  No performed at this time. Endoc: No evident thyromegaly, no signs of acromegaly or Cushing features Skin:   warm, no rashes, no ecchymosis  Extremities: normal, no cyanosis, clubbing.     LABORATORY PANEL:   CBC No results for input(s): WBC, HGB, HCT,  PLT in the last 168 hours. ------------------------------------------------------------------------------------------------------------------  Chemistries  No results for input(s): NA, K, CL, CO2, GLUCOSE, BUN, CREATININE, CALCIUM, MG, AST, ALT, ALKPHOS, BILITOT in the last 168 hours.  Invalid input(s): GFRCGP ------------------------------------------------------------------------------------------------------------------  Cardiac Enzymes No results for input(s): TROPONINI in the last 168 hours. ------------------------------------------------------------  RADIOLOGY:   No results found for this or any previous  visit. Results for orders placed in visit on 11/27/00  DG Chest 2 View   Narrative FINDINGS HISTORY:  COUGH, CHEST PAIN. TWO VIEW CHEST: THE HEART SIZE AND MEDIASTINAL CONTOURS ARE NORMAL. THE LUNGS ARE CLEAR. THE VISUALIZED SKELETON IS UNREMARKABLE. IMPRESSION NO ACTIVE DISEASE.   ------------------------------------------------------------------------------------------------------------------  Thank  you for allowing [facility], Critical Care to assist in the care of your patient. Our recommendations are noted above.  Please contact us if we can be of further service.   Wells Guiles, M.D., F.C.C.P.  Board Certified in Internal Medicine, Pulmonary Medicine, Critical Care Medicine, and Sleep Medicine.  [facility] Office Number: [phone]  04/22/2018

## 2018-04-23 ENCOUNTER — Ambulatory Visit: Payer: 59 | Admitting: Internal Medicine

## 2018-04-23 ENCOUNTER — Encounter: Payer: Self-pay | Admitting: Internal Medicine

## 2018-04-23 ENCOUNTER — Telehealth: Payer: Self-pay

## 2018-04-23 VITALS — BP 160/90 | HR 62 | Ht 74.0 in | Wt 332.0 lb

## 2018-04-23 VITALS — BP 148/92 | HR 68 | Temp 98.2°F | Wt 331.0 lb

## 2018-04-23 DIAGNOSIS — I10 Essential (primary) hypertension: Secondary | ICD-10-CM

## 2018-04-23 DIAGNOSIS — G4733 Obstructive sleep apnea (adult) (pediatric): Secondary | ICD-10-CM

## 2018-04-23 MED ORDER — LISINOPRIL 10 MG PO TABS: 20.0000 mg | ORAL_TABLET | Freq: Every day | ORAL | 3 refills | Status: DC

## 2018-04-23 NOTE — Patient Instructions (Signed)
Continue to use cpap every night.  

## 2018-04-23 NOTE — Progress Notes (Signed)
Subjective:    Patient ID: Troy Peterson, male    DOB: [birth year], 37 y.o.   MRN: [id number]  HPI  Pt presents to the clinic today with c/o elevated blood pressure. He reports he went for his DOT physical and his BP was 166/100. Recheck 154/78. His BP this morning was 160/90 at his pulmonologist office. He is taking Lisinopril as prescribed. He denies headaches, visual changes, chest pain or shortness of breath. His BP today is 148/92. There is no ECG on file.    Review of Systems  Past Medical History:  Diagnosis Date  . Hypertension   . OSA (obstructive sleep apnea) 01/09/2018    Current Outpatient Medications  Medication Sig Dispense Refill  . ibuprofen (ADVIL,MOTRIN) 200 MG tablet Take 200 mg by mouth as needed for pain.    Marland Kitchen lisinopril (PRINIVIL,ZESTRIL) 10 MG tablet Take 1 tablet (10 mg total) by mouth daily. 90 tablet 3  . venlafaxine (EFFEXOR) 37.5 MG tablet Take 1 tablet (37.5 mg total) by mouth at bedtime. 90 tablet 2   No current facility-administered medications for this visit.     No Known Allergies  Family History  Problem Relation Age of Onset  . Stroke Mother   . Colon cancer Neg Hx   . Prostate cancer Neg Hx     Social History   Socioeconomic History  . Marital status: Married    Spouse name: Not on file  . Number of children: Not on file  . Years of education: Not on file  . Highest education level: Not on file  Occupational History  . Not on file  Social Needs  . Financial resource strain: Not on file  . Food insecurity:    Worry: Not on file    Inability: Not on file  . Transportation needs:    Medical: Not on file    Non-medical: Not on file  Tobacco Use  . Smoking status: Former Research scientist (life sciences)  . Smokeless tobacco: Never Used  . Tobacco comment: quit 2011  Substance and Sexual Activity  . Alcohol use: No  . Drug use: No  . Sexual activity: Not on file  Lifestyle  . Physical activity:    Days per week: Not on file    Minutes per session:  Not on file  . Stress: Not on file  Relationships  . Social connections:    Talks on phone: Not on file    Gets together: Not on file    Attends religious service: Not on file    Active member of club or organization: Not on file    Attends meetings of clubs or organizations: Not on file    Relationship status: Not on file  . Intimate partner violence:    Fear of current or ex partner: Not on file    Emotionally abused: Not on file    Physically abused: Not on file    Forced sexual activity: Not on file  Other Topics Concern  . Not on file  Social History Narrative   Married 2001   3 kids   Enjoys time with family.     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  Respiratory: Denies difficulty breathing, shortness of breath, cough or sputum production.   Cardiovascular: Denies chest pain, chest tightness, palpitations or swelling in the hands or feet.  Neurological: Denies dizziness, difficulty with memory, difficulty with speech or  problems with balance and coordination.    No other specific complaints in a complete review of systems (except as listed in HPI above).     Objective:   Physical Exam   BP (!) 148/92   Pulse 68   Temp 98.2 F (36.8 C) (Oral)   Wt (!) 331 lb (150.1 kg)   SpO2 98%   BMI 42.50 kg/m  Wt Readings from Last 3 Encounters:  04/23/18 (!) 331 lb (150.1 kg)  04/23/18 (!) 332 lb (150.6 kg)  02/19/18 (!) 339 lb (153.8 kg)    General: Appears his stated age, obese, in NAD. Cardiovascular: Normal rate and rhythm. S1,S2 noted.  No murmur, rubs or gallops noted. No JVD or BLE edema.  Pulmonary/Chest: Normal effort and positive vesicular breath sounds. No respiratory distress. No wheezes, rales or ronchi noted.  Neurological: Alert and oriented.    BMET    Component Value Date/Time   NA 140 09/18/2017 1146   K 4.0 09/18/2017 1146   CL 104  09/18/2017 1146   CO2 29 09/18/2017 1146   GLUCOSE 96 09/18/2017 1146   BUN 15 09/18/2017 1146   CREATININE 0.75 09/18/2017 1146   CALCIUM 9.0 09/18/2017 1146   GFRNONAA >60 02/08/2008 0445   GFRAA  02/08/2008 0445    >60        The eGFR has been calculated using the MDRD equation. This calculation has not been validated in all clinical    Lipid Panel     Component Value Date/Time   CHOL 160 09/18/2017 1146   TRIG 96.0 09/18/2017 1146   HDL 46.50 09/18/2017 1146   CHOLHDL 3 09/18/2017 1146   VLDL 19.2 09/18/2017 1146   LDLCALC 94 09/18/2017 1146    CBC    Component Value Date/Time   WBC 19.6 (H) 02/08/2008 0445   RBC 4.36 02/08/2008 0445   HGB 12.5 (L) 02/08/2008 0445   HCT 36.8 (L) 02/08/2008 0445   PLT 238 02/08/2008 0445   MCV 84.4 02/08/2008 0445   MCHC 34.1 02/08/2008 0445   RDW 12.8 02/08/2008 0445   LYMPHSABS 3.0 02/07/2008 1825   MONOABS 1.8 (H) 02/07/2008 1825   EOSABS 0.1 02/07/2008 1825   BASOSABS 0.2 (H) 02/07/2008 1825    Hgb A1C No results found for: HGBA1C         Assessment & Plan:   HTN:  Increase Lisinopril to 20 mg daily- he will take 2 10 mg tabs at this time Reinforced DASH diet and exercise for weight loss  Follow up with PCP in 10-14 days follow up HTN Webb Silversmith, NP

## 2018-04-23 NOTE — Patient Instructions (Signed)

## 2018-04-23 NOTE — Telephone Encounter (Signed)
Will discuss at upcoming appt.

## 2018-04-23 NOTE — Telephone Encounter (Signed)
Destiny at front desk said pt walked in and BP today is 160/90. No other symptoms or complaints and pt was given appt 04/23/18 at 4pm with Pamala Hurry NP.

## 2018-05-07 ENCOUNTER — Encounter: Payer: Self-pay | Admitting: Family Medicine

## 2018-05-07 ENCOUNTER — Ambulatory Visit: Payer: 59 | Admitting: Family Medicine

## 2018-05-07 VITALS — BP 128/90 | HR 59 | Temp 97.7°F | Ht 74.0 in | Wt 333.8 lb

## 2018-05-07 DIAGNOSIS — I10 Essential (primary) hypertension: Secondary | ICD-10-CM

## 2018-05-07 DIAGNOSIS — Z23 Encounter for immunization: Secondary | ICD-10-CM | POA: Diagnosis not present

## 2018-05-07 LAB — BASIC METABOLIC PANEL
BUN: 15 mg/dL (ref 6–23)
CHLORIDE: 104 meq/L (ref 96–112)
CO2: 31 meq/L (ref 19–32)
Calcium: 9.1 mg/dL (ref 8.4–10.5)
Creatinine, Ser: 0.78 mg/dL (ref 0.40–1.50)
GFR: 117.65 mL/min (ref >60.00)
Glucose, Bld: 110 mg/dL — ABNORMAL HIGH (ref 70–99)
POTASSIUM: 4.5 meq/L (ref 3.5–5.1)
Sodium: 140 mEq/L (ref 135–145)

## 2018-05-07 MED ORDER — LISINOPRIL 20 MG PO TABS: 20.0000 mg | ORAL_TABLET | Freq: Every day | ORAL | 3 refills | Status: DC

## 2018-05-07 NOTE — Patient Instructions (Signed)
Go to the lab on the way out.  We'll contact you with your lab report. Don't change your meds for now.  Keep working on diet and exercise.  Try to limit salt.  Take care.  Glad to see you.

## 2018-05-07 NOTE — Progress Notes (Signed)
He saw pulmonary, on CPAP now.  He feels better using CPAP.  Sleeping better.  Fatigue is better. Compliant. He can tell a difference.    Hypertension:    Using medication without problems or lightheadedness: yes Chest pain with exertion:no Edema:no Short of breath:no He was recently increased to 20mg  lisinopril.   Meds, vitals, and allergies reviewed.   PMH and SH reviewed  ROS: Per HPI unless specifically indicated in ROS section   GEN: nad, alert and oriented HEENT: mucous membranes moist NECK: supple w/o LA CV: rrr. PULM: ctab, no inc wob ABD: soft, +bs EXT: no edema SKIN: no acute rash

## 2018-05-08 NOTE — Assessment & Plan Note (Signed)
Continue CPAP.  Continue work on diet and exercise and weight loss.  Blood pressure is improved now.  Continue as is for now.  Check bmet today given increased dose of lisinopril.  Routine cautions given.  Update me as needed.  He agreed.

## 2018-05-12 DIAGNOSIS — G4733 Obstructive sleep apnea (adult) (pediatric): Secondary | ICD-10-CM | POA: Diagnosis not present

## 2018-06-11 DIAGNOSIS — G4733 Obstructive sleep apnea (adult) (pediatric): Secondary | ICD-10-CM | POA: Diagnosis not present

## 2018-06-18 DIAGNOSIS — G4733 Obstructive sleep apnea (adult) (pediatric): Secondary | ICD-10-CM | POA: Diagnosis not present

## 2018-07-12 DIAGNOSIS — G4733 Obstructive sleep apnea (adult) (pediatric): Secondary | ICD-10-CM | POA: Diagnosis not present

## 2018-08-12 DIAGNOSIS — G4733 Obstructive sleep apnea (adult) (pediatric): Secondary | ICD-10-CM | POA: Diagnosis not present

## 2018-09-10 DIAGNOSIS — G4733 Obstructive sleep apnea (adult) (pediatric): Secondary | ICD-10-CM | POA: Diagnosis not present

## 2018-09-19 DIAGNOSIS — G4733 Obstructive sleep apnea (adult) (pediatric): Secondary | ICD-10-CM | POA: Diagnosis not present

## 2018-10-11 DIAGNOSIS — G4733 Obstructive sleep apnea (adult) (pediatric): Secondary | ICD-10-CM | POA: Diagnosis not present

## 2018-11-10 DIAGNOSIS — G4733 Obstructive sleep apnea (adult) (pediatric): Secondary | ICD-10-CM | POA: Diagnosis not present

## 2018-12-17 ENCOUNTER — Encounter: Payer: Self-pay | Admitting: Family Medicine

## 2018-12-17 ENCOUNTER — Other Ambulatory Visit: Payer: Self-pay

## 2018-12-17 ENCOUNTER — Ambulatory Visit: Payer: 59 | Admitting: Family Medicine

## 2018-12-17 VITALS — BP 158/90 | HR 77 | Temp 98.2°F | Ht 74.0 in | Wt 358.2 lb

## 2018-12-17 DIAGNOSIS — I10 Essential (primary) hypertension: Secondary | ICD-10-CM | POA: Diagnosis not present

## 2018-12-17 MED ORDER — LISINOPRIL 20 MG PO TABS: 40.0000 mg | ORAL_TABLET | Freq: Every day | ORAL | Status: DC

## 2018-12-17 NOTE — Patient Instructions (Addendum)
Increase the lisinopril to 2 tabs a day, recheck labs in about 10 days, schedule a lab visit on the way out.  Update me about your BP at that point.  If you get lightheaded, then cut back to 1.5 tabs a day.  Take care.  Glad to see you.

## 2018-12-17 NOTE — Progress Notes (Signed)
Hypertension:    Using medication without problems or lightheadedness:  yes Chest pain with exertion:no Edema: only once after prolonged driving, in the ankles.   Short of breath:no Average home BPs: BP elevated last night on home check.    Still compliant with CPAP.   FH: Mother with HTN.  Weight is up, d/w pt.  Discussed diet and exercise.  He has been trying to work on diet and exercise, but then got discouraged about gym closure with pandemic.    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    Meds, vitals, and allergies reviewed.   ROS: Per HPI unless specifically indicated in ROS section   GEN: nad, alert and oriented HEENT: ncat NECK: supple w/o LA CV: rrr. PULM: ctab, no inc wob ABD: soft, +bs EXT: no edema SKIN: no acute rash

## 2018-12-19 NOTE — Assessment & Plan Note (Signed)
Discussed options.  Discussed diet and exercise.  Advised - Increase the lisinopril to 2 tabs a day, recheck labs in about 10 days, schedule a lab visit on the way out.  Update me about your BP at that point.  If you get lightheaded, then cut back to 1.5 tabs a day.  He agrees with plan.

## 2018-12-24 ENCOUNTER — Other Ambulatory Visit (INDEPENDENT_AMBULATORY_CARE_PROVIDER_SITE_OTHER): Payer: 59

## 2018-12-24 ENCOUNTER — Other Ambulatory Visit: Payer: Self-pay

## 2018-12-24 DIAGNOSIS — I10 Essential (primary) hypertension: Secondary | ICD-10-CM | POA: Diagnosis not present

## 2018-12-24 LAB — BASIC METABOLIC PANEL
BUN: 22 mg/dL (ref 6–23)
CO2: 29 mEq/L (ref 19–32)
Calcium: 9.1 mg/dL (ref 8.4–10.5)
Chloride: 103 mEq/L (ref 96–112)
Creatinine, Ser: 0.85 mg/dL (ref 0.40–1.50)
GFR: 99.91 mL/min (ref >60.00)
Glucose, Bld: 108 mg/dL — ABNORMAL HIGH (ref 70–99)
Potassium: 4.8 mEq/L (ref 3.5–5.1)
Sodium: 138 mEq/L (ref 135–145)

## 2019-01-28 ENCOUNTER — Telehealth: Payer: Self-pay | Admitting: Family Medicine

## 2019-01-28 MED ORDER — LISINOPRIL 40 MG PO TABS: 40.0000 mg | ORAL_TABLET | Freq: Every day | ORAL | 1 refills | Status: DC

## 2019-01-28 NOTE — Telephone Encounter (Signed)
If doing well o/w, then yearly CPE in the spring of 2021.  Update [place] as needed.  Thanks.

## 2019-01-28 NOTE — Telephone Encounter (Signed)
Spoke with patient. He is requesting refill on Lisinopril 40 mg. Patient states he is doing well on this dose and his b/p readings have been around 133/80. Nothing over 140 or over 90. Refill sent in and sending to PCP as FYI. When should patient come for follow up. LOV 12/17/2018.

## 2019-01-28 NOTE — Telephone Encounter (Signed)
Pt spoke with [place]

## 2019-01-29 NOTE — Telephone Encounter (Signed)
LM for call back

## 2019-01-30 MED ORDER — LISINOPRIL 40 MG PO TABS: 40.0000 mg | ORAL_TABLET | Freq: Every day | ORAL | 1 refills | Status: DC

## 2019-01-30 NOTE — Addendum Note (Signed)
Addended by: Virl Cagey on: 01/30/2019 04:38 PM   Modules accepted: Orders

## 2019-01-30 NOTE — Telephone Encounter (Signed)
Pt aware that Rx sent to pharmacy - [facility] [place] Pt will call if any issues getting Rx   Pt aware of scheduling next appt 2021  Nothing further needed.

## 2019-03-01 ENCOUNTER — Other Ambulatory Visit: Payer: Self-pay | Admitting: Family Medicine

## 2019-07-29 ENCOUNTER — Other Ambulatory Visit: Payer: Self-pay | Admitting: Family Medicine

## 2019-09-09 ENCOUNTER — Encounter: Payer: Self-pay | Admitting: Family Medicine

## 2019-09-09 ENCOUNTER — Other Ambulatory Visit: Payer: Self-pay | Admitting: Family Medicine

## 2020-01-16 ENCOUNTER — Other Ambulatory Visit: Payer: Self-pay | Admitting: Family Medicine

## 2020-01-31 ENCOUNTER — Telehealth: Payer: Self-pay | Admitting: Family Medicine

## 2020-01-31 NOTE — Telephone Encounter (Signed)
Refill sent. Please call patient and schedule annual physical.

## 2020-02-06 NOTE — Telephone Encounter (Signed)
Called patient and left voicemail for patient to call office and get set up for cpe and labs.

## 2020-02-24 ENCOUNTER — Other Ambulatory Visit: Payer: Self-pay | Admitting: Family Medicine

## 2020-02-27 NOTE — Telephone Encounter (Signed)
Called patient and left voicemail. Patient needs to be scheduled for cpe and labs.

## 2020-03-03 ENCOUNTER — Encounter: Payer: Self-pay | Admitting: Family Medicine

## 2020-03-03 NOTE — Telephone Encounter (Signed)
Mailed letter °

## 2020-03-19 ENCOUNTER — Other Ambulatory Visit: Payer: Self-pay | Admitting: Family Medicine

## 2020-03-19 NOTE — Telephone Encounter (Signed)
Electronic refill request. Lisinopril Last office visit:   12/17/2018   Looks like last OV for HM was with [facility] Bruns, [facility] Last Filled:    30 tablet 0 02/24/2020  With notation to make appt for CPE for further refills.

## 2020-03-20 NOTE — Telephone Encounter (Signed)
Sent with notation to schedule follow-up. Thanks.

## 2020-05-27 ENCOUNTER — Telehealth: Payer: Self-pay

## 2020-05-27 NOTE — Telephone Encounter (Signed)
  LAST APPOINTMENT DATE: 12/17/2018   NEXT APPOINTMENT DATE:@12 /30/2021 -PHYSICAL   MEDICATION: lisinopril (ZESTRIL) 40 MG tablet  PHARMACY: [id number] - [place], [place] - [address]  COMMENTS: Patient has about 15 pills left

## 2020-05-28 ENCOUNTER — Other Ambulatory Visit: Payer: Self-pay

## 2020-05-28 MED ORDER — LISINOPRIL 40 MG PO TABS: ORAL_TABLET | ORAL | 0 refills | Status: DC

## 2020-06-17 ENCOUNTER — Other Ambulatory Visit: Payer: Self-pay | Admitting: Family Medicine

## 2020-06-24 ENCOUNTER — Telehealth: Payer: Self-pay

## 2020-06-24 MED ORDER — LISINOPRIL 40 MG PO TABS: 40.0000 mg | ORAL_TABLET | Freq: Every day | ORAL | 0 refills | Status: DC

## 2020-06-24 NOTE — Telephone Encounter (Signed)
If he doesn't have a fever, has a neg covid test, and the cough has been going on for months, then I would keep the appointment in person.  Thanks.

## 2020-06-24 NOTE — Addendum Note (Signed)
Addended by: Trudie Reed A on: 06/24/2020 10:37 AM   Modules accepted: Orders

## 2020-06-24 NOTE — Telephone Encounter (Signed)
I spoke pt; pt got a covid test today due to coughing for 3 months; pt has prod cough with white phlegm, lower back pain started today,no urinary symptoms and no known injury. Pt said he is in his car going home but feels like he is hot so may have slight fever; Pt does have H/A after coughing episode. Pt vomits after coughing episode; pt last coughing episode was 20 mins ago. No SOB,no diarrhea and no loss of taste or smell and no runny nose. pt has drainage at back of throat that causes tickle in throat; no other symptoms. UC and Ed precautions given and pt voiced understanding. Pt request cb in morning if should keep appt tomorrow afternoon or what to do.

## 2020-06-24 NOTE — Telephone Encounter (Signed)
Agreed, please see what details you can get and let me know.  Thanks.

## 2020-06-24 NOTE — Telephone Encounter (Signed)
Pt left v/m that he had a neg covid test today and pt is scheduled 06/25/20 at 3:30 for CPX and pt has slight cough that pt thinks is related to BP med (pt is on lisinopril 40 mg) pt request cb to let him know if he can come for CPX tomorrow. I was unable to reach pt by phone and left v/m for pt to call; I spoke with pt's wife to see why he had covid test; pts wife said she has no idea why he would have covid test and I will wait for cb from pt. Pt s wife said if she does not talk with pt today before 5 pm she will have pt call [facility] on 06/25/20 at 8 AM. FYI to Dr Para March

## 2020-06-25 ENCOUNTER — Encounter: Payer: 59 | Admitting: Family Medicine

## 2020-06-25 NOTE — Telephone Encounter (Signed)
Patient is aware 

## 2020-07-06 ENCOUNTER — Encounter: Payer: 59 | Admitting: Family Medicine

## 2020-07-17 ENCOUNTER — Encounter: Payer: 59 | Admitting: Family Medicine

## 2020-07-29 ENCOUNTER — Other Ambulatory Visit: Payer: Self-pay

## 2020-07-30 ENCOUNTER — Encounter: Payer: Self-pay | Admitting: Family Medicine

## 2020-07-30 ENCOUNTER — Ambulatory Visit (INDEPENDENT_AMBULATORY_CARE_PROVIDER_SITE_OTHER): Payer: 59 | Admitting: Family Medicine

## 2020-07-30 VITALS — BP 140/98 | HR 76 | Temp 97.6°F | Ht 74.0 in | Wt 378.0 lb

## 2020-07-30 DIAGNOSIS — Z6841 Body Mass Index (BMI) 40.0 and over, adult: Secondary | ICD-10-CM

## 2020-07-30 DIAGNOSIS — Z0000 Encounter for general adult medical examination without abnormal findings: Secondary | ICD-10-CM | POA: Diagnosis not present

## 2020-07-30 DIAGNOSIS — Z23 Encounter for immunization: Secondary | ICD-10-CM | POA: Diagnosis not present

## 2020-07-30 DIAGNOSIS — I10 Essential (primary) hypertension: Secondary | ICD-10-CM | POA: Diagnosis not present

## 2020-07-30 DIAGNOSIS — Z7189 Other specified counseling: Secondary | ICD-10-CM

## 2020-07-30 DIAGNOSIS — G4733 Obstructive sleep apnea (adult) (pediatric): Secondary | ICD-10-CM

## 2020-07-30 MED ORDER — LOSARTAN POTASSIUM 100 MG PO TABS: 100.0000 mg | ORAL_TABLET | Freq: Every day | ORAL | 3 refills | Status: DC

## 2020-07-30 NOTE — Patient Instructions (Addendum)
Go to the lab on the way out.   If you have mychart we'll likely use that to update you.    We'll call about the referral.   Change the lisinopril.  Update me if not tolerated or if the cough continues.  Goal BP <140/<90.  Take care.  Glad to see you.

## 2020-07-30 NOTE — Progress Notes (Signed)
This visit occurred during the SARS-CoV-2 public health emergency.  Safety protocols were in place, including screening questions prior to the visit, additional usage of staff PPE, and extensive cleaning of exam room while observing appropriate contact time as indicated for disinfecting solutions.  CPE- See plan.  Routine anticipatory guidance given to patient.  See health maintenance.  The possibility exists that previously documented standard health maintenance information may have been brought forward from a previous encounter into this note.  If needed, that same information has been updated to reflect the current situation based on today's encounter.    Tetanus 2014 Flu 2022 PNA and shingles not due  He had covid vaccine.   Colon and prostate cancer screening not due. Living will d/w pt. Wife designated if patient were incapacitated.  Diet and exercise d/w pt. both tend to go together, ie will adhere to both and then gradually get out of the habit.   D/w pt about options for diet/exercise.  he has tried DASH and low carb diets.   HIV prev neg per patient report.  Referral placed for weight mgmt clinic.  Discussed.  OSA on cpap.  Nasal mask.  Compliant.  He isn't snoring.  He still doesn't feel fully rested in spite of use, with f/u labs pending.   Hypertension:    Using medication without problems or lightheadedness:  Yes except for cough that could be from ACE Chest pain with exertion: no Edema:no Short of breath:no  PMH and SH reviewed  Meds, vitals, and allergies reviewed.   ROS: Per HPI.  Unless specifically indicated otherwise in HPI, the patient denies:  General: fever. Eyes: acute vision changes ENT: sore throat Cardiovascular: chest pain Respiratory: SOB GI: vomiting GU: dysuria Musculoskeletal: acute back pain Derm: acute rash Neuro: acute motor dysfunction Psych: worsening mood Endocrine: polydipsia Heme: bleeding Allergy: hayfever  GEN: nad, alert and  oriented HEENT: NCAT NECK: supple w/o LA CV: rrr. PULM: ctab, no inc wob ABD: soft, +bs EXT: no edema SKIN: no acute rash

## 2020-07-31 LAB — CBC WITH DIFFERENTIAL/PLATELET
Basophils Absolute: 0.1 10*3/uL (ref 0.0–0.1)
Basophils Relative: 1.1 % (ref 0.0–3.0)
Eosinophils Absolute: 0.2 10*3/uL (ref 0.0–0.7)
Eosinophils Relative: 1.4 % (ref 0.0–5.0)
HCT: 41.4 % (ref 39.0–52.0)
Hemoglobin: 13.7 g/dL (ref 13.0–17.0)
Lymphocytes Relative: 28.2 % (ref 12.0–46.0)
Lymphs Abs: 3.1 10*3/uL (ref 0.7–4.0)
MCHC: 33.1 g/dL (ref 30.0–36.0)
MCV: 83.3 fl (ref 78.0–100.0)
Monocytes Absolute: 0.8 10*3/uL (ref 0.1–1.0)
Monocytes Relative: 7.5 % (ref 3.0–12.0)
Neutro Abs: 6.8 10*3/uL (ref 1.4–7.7)
Neutrophils Relative %: 61.8 % (ref 43.0–77.0)
Platelets: 256 10*3/uL (ref 150.0–400.0)
RBC: 4.97 Mil/uL (ref 4.22–5.81)
RDW: 13.9 % (ref 11.5–15.5)
WBC: 11 10*3/uL — ABNORMAL HIGH (ref 4.0–10.5)

## 2020-07-31 LAB — COMPREHENSIVE METABOLIC PANEL
ALT: 22 U/L (ref 0–53)
AST: 21 U/L (ref 0–37)
Albumin: 4.2 g/dL (ref 3.5–5.2)
Alkaline Phosphatase: 72 U/L (ref 39–117)
BUN: 13 mg/dL (ref 6–23)
CO2: 30 mEq/L (ref 19–32)
Calcium: 9.2 mg/dL (ref 8.4–10.5)
Chloride: 103 mEq/L (ref 96–112)
Creatinine, Ser: 0.77 mg/dL (ref 0.40–1.50)
GFR: 111.26 mL/min (ref >60.00)
Glucose, Bld: 107 mg/dL — ABNORMAL HIGH (ref 70–99)
Potassium: 3.9 mEq/L (ref 3.5–5.1)
Sodium: 138 mEq/L (ref 135–145)
Total Bilirubin: 0.6 mg/dL (ref 0.2–1.2)
Total Protein: 7.1 g/dL (ref 6.0–8.3)

## 2020-07-31 LAB — LIPID PANEL
Cholesterol: 148 mg/dL (ref 0–200)
HDL: 38.1 mg/dL — ABNORMAL LOW (ref >39.00)
LDL Cholesterol: 93 mg/dL (ref 0–99)
NonHDL: 109.52
Total CHOL/HDL Ratio: 4
Triglycerides: 83 mg/dL (ref 0.0–149.0)
VLDL: 16.6 mg/dL (ref 0.0–40.0)

## 2020-07-31 LAB — TSH: TSH: 1.15 u[IU]/mL (ref 0.35–4.50)

## 2020-08-02 NOTE — Assessment & Plan Note (Signed)
Tetanus 2014 Flu 2022 PNA and shingles not due  He had covid vaccine.   Colon and prostate cancer screening not due. Living will d/w pt. Wife designated if patient were incapacitated.  Diet and exercise d/w pt. both tend to go together, ie will adhere to both and then gradually get out of the habit.   D/w pt about options for diet/exercise.  he has tried DASH and low carb diets.   HIV prev neg per patient report.  Referral placed for weight mgmt clinic.  Discussed.

## 2020-08-02 NOTE — Assessment & Plan Note (Signed)
Change lisinopril to losartan.  Rationale discussed with patient.  He will let me know if the cough does not improve.  Okay for outpatient follow-up.  I asked him to update me if his blood pressure not controlled.  See after visit summary.

## 2020-08-02 NOTE — Assessment & Plan Note (Signed)
Continue with CPAP use.  See notes on labs.

## 2020-08-02 NOTE — Assessment & Plan Note (Signed)
Living will d/w pt.  Wife designated if patient were incapacitated.   ?

## 2020-10-28 ENCOUNTER — Ambulatory Visit (INDEPENDENT_AMBULATORY_CARE_PROVIDER_SITE_OTHER): Payer: 59 | Admitting: Bariatrics

## 2020-10-28 ENCOUNTER — Encounter (INDEPENDENT_AMBULATORY_CARE_PROVIDER_SITE_OTHER): Payer: Self-pay | Admitting: Bariatrics

## 2020-10-28 ENCOUNTER — Other Ambulatory Visit: Payer: Self-pay

## 2020-10-28 VITALS — BP 137/86 | HR 62 | Temp 97.7°F | Ht 74.0 in | Wt 368.0 lb

## 2020-10-28 DIAGNOSIS — R5383 Other fatigue: Secondary | ICD-10-CM | POA: Diagnosis not present

## 2020-10-28 DIAGNOSIS — G4733 Obstructive sleep apnea (adult) (pediatric): Secondary | ICD-10-CM | POA: Diagnosis not present

## 2020-10-28 DIAGNOSIS — R0602 Shortness of breath: Secondary | ICD-10-CM | POA: Diagnosis not present

## 2020-10-28 DIAGNOSIS — Z9189 Other specified personal risk factors, not elsewhere classified: Secondary | ICD-10-CM | POA: Diagnosis not present

## 2020-10-28 DIAGNOSIS — Z1331 Encounter for screening for depression: Secondary | ICD-10-CM

## 2020-10-28 DIAGNOSIS — E559 Vitamin D deficiency, unspecified: Secondary | ICD-10-CM | POA: Diagnosis not present

## 2020-10-28 DIAGNOSIS — Z6841 Body Mass Index (BMI) 40.0 and over, adult: Secondary | ICD-10-CM

## 2020-10-28 DIAGNOSIS — I10 Essential (primary) hypertension: Secondary | ICD-10-CM | POA: Diagnosis not present

## 2020-10-28 DIAGNOSIS — R7303 Prediabetes: Secondary | ICD-10-CM

## 2020-10-28 DIAGNOSIS — Z0289 Encounter for other administrative examinations: Secondary | ICD-10-CM

## 2020-10-29 LAB — VITAMIN D 25 HYDROXY (VIT D DEFICIENCY, FRACTURES): Vit D, 25-Hydroxy: 16 ng/mL — ABNORMAL LOW (ref 30.0–100.0)

## 2020-10-29 LAB — INSULIN, RANDOM: INSULIN: 17.7 u[IU]/mL (ref 2.6–24.9)

## 2020-10-29 LAB — HEMOGLOBIN A1C
Est. average glucose Bld gHb Est-mCnc: 126 mg/dL
Hgb A1c MFr Bld: 6 % — ABNORMAL HIGH (ref 4.8–5.6)

## 2020-10-29 NOTE — Progress Notes (Signed)
Chief Complaint:   OBESITY Troy Peterson (MR# [id number]) is a 40 y.o. male who presents for evaluation and treatment of obesity and related comorbidities. Current BMI is Body mass index is 47.25 kg/m. Troy Peterson has been struggling with his weight for many years and has been unsuccessful in either losing weight, maintaining weight loss, or reaching his healthy weight goal.  Troy Peterson does not like to cook, and he craves biscuits. He skips breakfast, and he states that he "never feels hungry".  Troy Peterson is currently in the action stage of change and ready to dedicate time achieving and maintaining a healthier weight. Troy Peterson is interested in becoming our patient and working on intensive lifestyle modifications including (but not limited to) diet and exercise for weight loss.  [name]'s habits were reviewed today and are as follows: His family eats meals together, he thinks his family will eat healthier with him, his desired weight loss is 93 lbs, he has been heavy most of his life, he started gaining weight as a pre-teen, his heaviest weight ever was 381 pounds, he has significant food cravings issues, he snacks frequently in the evenings, he skips meals frequently, he is frequently drinking liquids with calories, he frequently makes poor food choices and he frequently eats larger portions than normal.  Depression Screen [name] Food and Mood (modified PHQ-9) Peterson was 3.  Depression screen [facility] 2/9 10/28/2020  Decreased Interest 0  Down, Depressed, Hopeless 1  PHQ - 2 Peterson 1  Altered sleeping 1  Tired, decreased energy 1  Change in appetite 0  Feeling bad or failure about yourself  0  Trouble concentrating 0  Moving slowly or fidgety/restless 0  Suicidal thoughts 0  PHQ-9 Peterson 3  Difficult doing work/chores Not difficult at all   Subjective:   1. Other fatigue Troy Peterson admits to daytime somnolence and admits to waking up still tired. Patent has a history of symptoms of daytime fatigue.  Troy Peterson generally gets 4 or 6 hours of sleep per night, and states that he has generally restful sleep. Troy Peterson is present. Apneic episodes are present. Troy Peterson is 11.  2. SOB (shortness of breath) on exertion Troy Peterson notes increasing shortness of breath with exercising and seems to be worsening over time with weight gain. He notes getting out of breath sooner with activity than he used to. This has not gotten worse recently. [name] denies shortness of breath at rest or orthopnea.  3. Essential hypertension Troy Peterson is taking losartan, and his blood pressure is controlled.  4. OSA (obstructive sleep apnea) Mikolaj wears his CPAP, and he feels rested.  5. Pre-diabetes Troy Peterson's last glucose was 107.   6. Vitamin D deficiency Troy Peterson is not currently on Vit D supplement.  7. At risk for activity intolerance Braylin is at risk for exercise intolerance obesity, fatigue, and shortness of breath.  Assessment/Plan:   1. Other fatigue Troy Peterson does feel that his weight is causing his energy to be lower than it should be. Fatigue may be related to obesity, depression or many other causes. Labs will be ordered, and in the meanwhile, Troy Peterson will focus on self care including making healthy food choices, increasing physical activity and focusing on stress reduction.  - EKG 12-Lead - Hemoglobin A1c - Insulin, random - VITAMIN D 25 Hydroxy (Vit-D Deficiency, Fractures)  2. SOB (shortness of breath) on exertion Troy Peterson does feel that he gets out of breath more easily that he used to when he exercises. Troy Peterson's shortness of breath  appears to be obesity related and exercise induced. He has agreed to work on weight loss and gradually increase exercise to treat his exercise induced shortness of breath. Will continue to monitor closely.  3. Essential hypertension Troy Peterson will continue losartan, and will continue working on healthy weight loss and exercise to improve blood pressure control. We will  watch for signs of hypotension as he continues his lifestyle modifications.  4. OSA (obstructive sleep apnea) Intensive lifestyle modifications are the first line treatment for this issue. We discussed several lifestyle modifications today. Will continue his CPAP nightly, and will continue to work on diet, exercise and weight loss efforts. We will continue to monitor. Orders and follow up as documented in patient record.   5. Pre-diabetes Troy Peterson will continue to work on weight loss, exercise, and decreasing simple carbohydrates to help decrease the risk of diabetes. We will check labs today.  - Hemoglobin A1c - Insulin, random  6. Vitamin D deficiency Low Vitamin D level contributes to fatigue and are associated with obesity, breast, and colon cancer. We will check labs today. Sun will follow-up for routine testing of Vitamin D, at least 2-3 times per year to avoid over-replacement.  - VITAMIN D 25 Hydroxy (Vit-D Deficiency, Fractures)  7. Depression screen Troy Peterson had a negative depression screening. Depression is commonly associated with obesity and often results in emotional eating behaviors. We will monitor this closely and work on CBT to help improve the non-hunger eating patterns. Referral to Psychology may be required if no improvement is seen as he continues in our clinic.  8. At risk for activity intolerance Troy Peterson was given approximately 15 minutes of exercise intolerance counseling today. He is 40 y.o. male and has risk factors exercise intolerance including obesity. We discussed intensive lifestyle modifications today with an emphasis on specific weight loss instructions and strategies. Troy Peterson will slowly increase activity as tolerated.  Repetitive spaced learning was employed today to elicit superior memory formation and behavioral change.  9. Obesity, current BMI 47 Troy Peterson is currently in the action stage of change and his goal is to continue with weight loss efforts. I  recommend Troy Peterson begin the structured treatment plan as follows:  He has agreed to the Category 3 Plan.  I reviewed labs from 07/30/2020 with the patient today. [name] will stop sugary drinks.  Exercise goals: No exercise has been prescribed at this time.   Behavioral modification strategies: increasing lean protein intake, decreasing simple carbohydrates, increasing vegetables, increasing water intake, decreasing eating out, no skipping meals, meal planning and cooking strategies, keeping healthy foods in the home and planning for success.  He was informed of the importance of frequent follow-up visits to maximize his success with intensive lifestyle modifications for his multiple health conditions. He was informed we would discuss his lab results at his next visit unless there is a critical issue that needs to be addressed sooner. Tarris agreed to keep his next visit at the agreed upon time to discuss these results.  Objective:   Blood pressure 137/86, pulse 62, temperature 97.7 F (36.5 C), height 6\' 2"  (1.88 m), weight (!) 368 lb (166.9 kg), SpO2 94 %. Body mass index is 47.25 kg/m.  EKG: Normal sinus rhythm, rate 62 BPM.  Indirect Calorimeter completed today shows a VO2 of 321 and a REE of 2235.  His calculated basal metabolic rate is thus his basal metabolic rate is worse than expected.  General: Cooperative, alert, well developed, in no acute distress. HEENT: Conjunctivae and lids  unremarkable. Cardiovascular: Regular rhythm.  Lungs: Normal work of breathing. Neurologic: No focal deficits.   Lab Results  Component Value Date   CREATININE 0.77 07/30/2020   BUN 13 07/30/2020   NA 138 07/30/2020   K 3.9 07/30/2020   CL 103 07/30/2020   CO2 30 07/30/2020   Lab Results  Component Value Date   ALT 22 07/30/2020   AST 21 07/30/2020   ALKPHOS 72 07/30/2020   BILITOT 0.6 07/30/2020   Lab Results  Component Value Date   HGBA1C 6.0 (H) 10/28/2020   Lab Results   Component Value Date   INSULIN 17.7 10/28/2020   Lab Results  Component Value Date   TSH 1.15 07/30/2020   Lab Results  Component Value Date   CHOL 148 07/30/2020   HDL 38.10 (L) 07/30/2020   LDLCALC 93 07/30/2020   LDLDIRECT 102.0 07/25/2016   TRIG 83.0 07/30/2020   CHOLHDL 4 07/30/2020   Lab Results  Component Value Date   WBC 11.0 (H) 07/30/2020   HGB 13.7 07/30/2020   HCT 41.4 07/30/2020   MCV 83.3 07/30/2020   PLT 256.0 07/30/2020   No results found for: IRON, TIBC, FERRITIN  Attestation Statements:   Reviewed by clinician on day of visit: allergies, medications, problem list, medical history, surgical history, family history, social history, and previous encounter notes.   Trude Mcburney, am acting as Energy manager for [employer], DO.  I have reviewed the above documentation for accuracy and completeness, and I agree with the above. Corinna Capra, DO

## 2020-11-02 ENCOUNTER — Encounter (INDEPENDENT_AMBULATORY_CARE_PROVIDER_SITE_OTHER): Payer: Self-pay | Admitting: Bariatrics

## 2020-11-02 DIAGNOSIS — E559 Vitamin D deficiency, unspecified: Secondary | ICD-10-CM | POA: Insufficient documentation

## 2020-11-02 DIAGNOSIS — R7303 Prediabetes: Secondary | ICD-10-CM | POA: Insufficient documentation

## 2020-11-03 ENCOUNTER — Encounter (INDEPENDENT_AMBULATORY_CARE_PROVIDER_SITE_OTHER): Payer: Self-pay | Admitting: Bariatrics

## 2020-11-11 ENCOUNTER — Other Ambulatory Visit: Payer: Self-pay

## 2020-11-11 ENCOUNTER — Ambulatory Visit (INDEPENDENT_AMBULATORY_CARE_PROVIDER_SITE_OTHER): Payer: 59 | Admitting: Bariatrics

## 2020-11-11 ENCOUNTER — Encounter (INDEPENDENT_AMBULATORY_CARE_PROVIDER_SITE_OTHER): Payer: Self-pay | Admitting: Bariatrics

## 2020-11-11 VITALS — BP 127/84 | HR 74 | Temp 98.0°F | Ht 74.0 in | Wt 358.0 lb

## 2020-11-11 DIAGNOSIS — I10 Essential (primary) hypertension: Secondary | ICD-10-CM | POA: Diagnosis not present

## 2020-11-11 DIAGNOSIS — R7303 Prediabetes: Secondary | ICD-10-CM | POA: Diagnosis not present

## 2020-11-11 DIAGNOSIS — K5909 Other constipation: Secondary | ICD-10-CM | POA: Diagnosis not present

## 2020-11-11 DIAGNOSIS — Z6841 Body Mass Index (BMI) 40.0 and over, adult: Secondary | ICD-10-CM

## 2020-11-11 DIAGNOSIS — Z9189 Other specified personal risk factors, not elsewhere classified: Secondary | ICD-10-CM | POA: Diagnosis not present

## 2020-11-11 DIAGNOSIS — E559 Vitamin D deficiency, unspecified: Secondary | ICD-10-CM | POA: Diagnosis not present

## 2020-11-11 MED ORDER — METFORMIN HCL 500 MG PO TABS: 500.0000 mg | ORAL_TABLET | Freq: Every day | ORAL | 0 refills | Status: DC

## 2020-11-11 MED ORDER — VITAMIN D (ERGOCALCIFEROL) 1.25 MG (50000 UNIT) PO CAPS
50000.0000 [IU] | ORAL_CAPSULE | ORAL | 0 refills | Status: DC
Start: 2020-11-11 — End: 2021-01-07

## 2020-11-16 NOTE — Progress Notes (Signed)
Chief Complaint:   OBESITY Troy Peterson is here to discuss his progress with his obesity treatment plan along with follow-up of his obesity related diagnoses. Troy Peterson is on the Category 3 Plan and states he is following his eating plan approximately 100% of the time. Ziv states he is not exercising regularly.  Today's visit was #: 2 Starting weight: 368 lbs Starting date: 10/28/2020 Today's weight: 358 lbs Today's date: 11/11/2020 Total lbs lost to date: 10 lbs Total lbs lost since last in-office visit: 10 lbs  Interim History: Troy Peterson is down 10 pounds from his last visit.  The plan was not that hard on most days, he states.  Subjective:   1. Vitamin D deficiency Troy Peterson's Vitamin D level was 16.0 on 10/28/2020. He is currently taking no vitamin D supplement. He denies nausea, vomiting or muscle weakness.  2. Pre-diabetes Troy Peterson has a diagnosis of prediabetes based on his elevated HgA1c and was informed this puts him at greater risk of developing diabetes. He continues to work on diet and exercise to decrease his risk of diabetes. He denies nausea or hypoglycemia.  No medications.  A1c 6.0, insulin 17.7.  Lab Results  Component Value Date   HGBA1C 6.0 (H) 10/28/2020   Lab Results  Component Value Date   INSULIN 17.7 10/28/2020   3. Essential hypertension Review: taking medications as instructed, no medication side effects noted, no chest pain on exertion, no dyspnea on exertion, no swelling of ankles.  Blood pressure 127/84.  Taking Cozaar.  BP Readings from Last 3 Encounters:  11/11/20 127/84  10/28/20 137/86  07/30/20 (!) 140/98   4. Other constipation With increased protein.  5. At risk of diabetes mellitus Dorsel is at higher than average risk for developing diabetes due to obesity and prediabetes.   Assessment/Plan:   1. Vitamin D deficiency Low Vitamin D level contributes to fatigue and are associated with obesity, breast, and colon cancer. He agrees to start to  take prescription Vitamin D @50 ,000 IU every week and will follow-up for routine testing of Vitamin D, at least 2-3 times per year to avoid over-replacement.  - Start Vitamin D, Ergocalciferol, (DRISDOL) 1.25 MG (50000 UNIT) CAPS capsule; Take 1 capsule (50,000 Units total) by mouth every 7 (seven) days.  Dispense: 12 capsule; Refill: 0  2. Pre-diabetes Troy Peterson will continue to work on weight loss, exercise, and decreasing simple carbohydrates to help decrease the risk of diabetes.  Will continue to eat on plan.  Decrease carbohydrates and increase healthy fats and protein.  Start metformin 500 mg daily.  - Start metFORMIN (GLUCOPHAGE) 500 MG tablet; Take 1 tablet (500 mg total) by mouth daily with breakfast.  Dispense: 39 tablet; Refill: 0  3. Essential hypertension Troy Peterson is working on healthy weight loss and exercise to improve blood pressure control. We will watch for signs of hypotension as he continues his lifestyle modifications.  Continue Cozaar.  4. Other constipation Troy Peterson was informed that a decrease in bowel movement frequency is normal while losing weight, but stools should not be hard or painful. Orders and follow up as documented in patient record.  Start MiraLAX or Citrucel with a glass of water.  Start OTC stool softener.   Counseling Getting to Good Bowel Health: Your goal is to have one soft bowel movement each day. Drink at least 8 glasses of water each day. Eat plenty of fiber (goal is over 25 grams each day). It is best to get most of your fiber from dietary  sources which includes leafy green vegetables, fresh fruit, and whole grains. You may need to add fiber with the help of OTC fiber supplements. These include Metamucil, Citrucel, and Flaxseed. If you are still having trouble, try adding Miralax or Magnesium Citrate. If all of these changes do not work, Dietitian.  5. At risk of diabetes mellitus Troy Peterson was given approximately 15 minutes of diabetes education  and counseling today. We discussed intensive lifestyle modifications today with an emphasis on weight loss as well as increasing exercise and decreasing simple carbohydrates in his diet. We also reviewed medication options with an emphasis on risk versus benefit of those discussed.   Repetitive spaced learning was employed today to elicit superior memory formation and behavioral change.  6. Obesity, current BMI 46  Troy Peterson is currently in the action stage of change. As such, his goal is to continue with weight loss efforts. He has agreed to the Category 3 Plan and keeping a food journal and adhering to recommended goals of 1500 calories and 90 grams of protein.   He will work on meal planning and will be mindful of calories.  Labs from 10/28/2020, were reviewed including A1c, glucose, and insulin, were reviewed today.  Exercise goals: Will start to walk, play tennis.  Behavioral modification strategies: increasing lean protein intake, decreasing simple carbohydrates, increasing vegetables, increasing water intake, decreasing eating out, no skipping meals, meal planning and cooking strategies, keeping healthy foods in the home and planning for success.  Dontell has agreed to follow-up with our clinic in 2 weeks. He was informed of the importance of frequent follow-up visits to maximize his success with intensive lifestyle modifications for his multiple health conditions.   Objective:   Blood pressure 127/84, pulse 74, temperature 98 F (36.7 C), height 6\' 2"  (1.88 m), weight (!) 358 lb (162.4 kg), SpO2 96 %. Body mass index is 45.96 kg/m.  General: Cooperative, alert, well developed, in no acute distress. HEENT: Conjunctivae and lids unremarkable. Cardiovascular: Regular rhythm.  Lungs: Normal work of breathing. Neurologic: No focal deficits.   Lab Results  Component Value Date   CREATININE 0.77 07/30/2020   BUN 13 07/30/2020   NA 138 07/30/2020   K 3.9 07/30/2020   CL 103 07/30/2020    CO2 30 07/30/2020   Lab Results  Component Value Date   ALT 22 07/30/2020   AST 21 07/30/2020   ALKPHOS 72 07/30/2020   BILITOT 0.6 07/30/2020   Lab Results  Component Value Date   HGBA1C 6.0 (H) 10/28/2020   Lab Results  Component Value Date   INSULIN 17.7 10/28/2020   Lab Results  Component Value Date   TSH 1.15 07/30/2020   Lab Results  Component Value Date   CHOL 148 07/30/2020   HDL 38.10 (L) 07/30/2020   LDLCALC 93 07/30/2020   LDLDIRECT 102.0 07/25/2016   TRIG 83.0 07/30/2020   CHOLHDL 4 07/30/2020   Lab Results  Component Value Date   WBC 11.0 (H) 07/30/2020   HGB 13.7 07/30/2020   HCT 41.4 07/30/2020   MCV 83.3 07/30/2020   PLT 256.0 07/30/2020   Attestation Statements:   Reviewed by clinician on day of visit: allergies, medications, problem list, medical history, surgical history, family history, social history, and previous encounter notes.  I, 09/27/2020, CMA, am acting as Insurance claims handler for Energy manager, DO  I have reviewed the above documentation for accuracy and completeness, and I agree with the above. [employer], DO

## 2020-11-17 ENCOUNTER — Encounter (INDEPENDENT_AMBULATORY_CARE_PROVIDER_SITE_OTHER): Payer: Self-pay | Admitting: Bariatrics

## 2020-12-02 ENCOUNTER — Encounter (INDEPENDENT_AMBULATORY_CARE_PROVIDER_SITE_OTHER): Payer: Self-pay | Admitting: Bariatrics

## 2020-12-02 ENCOUNTER — Ambulatory Visit (INDEPENDENT_AMBULATORY_CARE_PROVIDER_SITE_OTHER): Payer: 59 | Admitting: Bariatrics

## 2020-12-02 ENCOUNTER — Other Ambulatory Visit: Payer: Self-pay

## 2020-12-02 VITALS — BP 142/81 | HR 78 | Temp 97.8°F | Ht 74.0 in | Wt 353.0 lb

## 2020-12-02 DIAGNOSIS — E559 Vitamin D deficiency, unspecified: Secondary | ICD-10-CM | POA: Diagnosis not present

## 2020-12-02 DIAGNOSIS — R7303 Prediabetes: Secondary | ICD-10-CM

## 2020-12-02 DIAGNOSIS — Z9189 Other specified personal risk factors, not elsewhere classified: Secondary | ICD-10-CM | POA: Diagnosis not present

## 2020-12-02 DIAGNOSIS — Z6841 Body Mass Index (BMI) 40.0 and over, adult: Secondary | ICD-10-CM

## 2020-12-02 MED ORDER — METFORMIN HCL 500 MG PO TABS: 500.0000 mg | ORAL_TABLET | Freq: Every day | ORAL | 0 refills | Status: DC

## 2020-12-08 ENCOUNTER — Encounter (INDEPENDENT_AMBULATORY_CARE_PROVIDER_SITE_OTHER): Payer: Self-pay | Admitting: Bariatrics

## 2020-12-08 NOTE — Progress Notes (Signed)
Chief Complaint:   OBESITY Troy Peterson is here to discuss his progress with his obesity treatment plan along with follow-up of his obesity related diagnoses. Troy Peterson is on the Category 3 Plan and states he is following his eating plan approximately 98% of the time. Troy Peterson states he is not currently exercising.  Today's visit was #: 3 Starting weight: 368 lbs Starting date: 10/28/2020 Today's weight: 353 lbs Today's date: 12/02/2020 Total lbs lost to date: 15 Total lbs lost since last in-office visit: 5  Interim History: Troy Peterson is down 5 lbs since his last visit and doing well overall.  Subjective:   1. Pre-diabetes Troy Peterson has a diagnosis of prediabetes based on his elevated HgA1c and was informed this puts him at greater risk of developing diabetes. He continues to work on diet and exercise to decrease his risk of diabetes. He denies nausea or hypoglycemia.  Lab Results  Component Value Date   HGBA1C 6.0 (H) 10/28/2020   Lab Results  Component Value Date   INSULIN 17.7 10/28/2020    2. Vitamin D deficiency Troy Peterson is taking prescription Vit D weekly. He reports minimal sun exposure.  3. At risk for osteoporosis Troy Peterson is at higher risk of osteopenia and osteoporosis due to Vitamin D deficiency.   Assessment/Plan:   1. Pre-diabetes Troy Peterson will continue to work on weight loss, exercise, and decreasing simple carbohydrates to help decrease the risk of diabetes.   - metFORMIN (GLUCOPHAGE) 500 MG tablet; Take 1 tablet (500 mg total) by mouth daily with breakfast.  Dispense: 39 tablet; Refill: 0  2. Vitamin D deficiency Low Vitamin D level contributes to fatigue and are associated with obesity, breast, and colon cancer. He agrees to continue to take prescription Vitamin D @50 ,000 IU every week and will follow-up for routine testing of Vitamin D, at least 2-3 times per year to avoid over-replacement.  3. At risk for osteoporosis Troy Peterson was given approximately 15 minutes of  osteoporosis prevention counseling today. Troy Peterson is at risk for osteopenia and osteoporosis due to his Vitamin D deficiency. He was encouraged to take his Vitamin D and follow his higher calcium diet and increase strengthening exercise to help strengthen his bones and decrease his risk of osteopenia and osteoporosis.  Repetitive spaced learning was employed today to elicit superior memory formation and behavioral change.  4. Obesity with current BMI of 45.4  Troy Peterson is currently in the action stage of change. As such, his goal is to continue with weight loss efforts. He has agreed to the Category 3 Plan.   Meal plan Mindful eating Increase water intake.  Exercise goals:  Continue to walk  Behavioral modification strategies: increasing lean protein intake, decreasing simple carbohydrates, increasing vegetables, increasing water intake, decreasing eating out, no skipping meals, meal planning and cooking strategies, keeping healthy foods in the home, and planning for success.  Troy Peterson has agreed to follow-up with our clinic in 2 weeks. He was informed of the importance of frequent follow-up visits to maximize his success with intensive lifestyle modifications for his multiple health conditions.   Objective:   Blood pressure (!) 142/81, pulse 78, temperature 97.8 F (36.6 C), height 6\' 2"  (1.88 m), weight (!) 353 lb (160.1 kg), SpO2 96 %. Body mass index is 45.32 kg/m.  General: Cooperative, alert, well developed, in no acute distress. HEENT: Conjunctivae and lids unremarkable. Cardiovascular: Regular rhythm.  Lungs: Normal work of breathing. Neurologic: No focal deficits.   Lab Results  Component Value Date   CREATININE  0.77 07/30/2020   BUN 13 07/30/2020   NA 138 07/30/2020   K 3.9 07/30/2020   CL 103 07/30/2020   CO2 30 07/30/2020   Lab Results  Component Value Date   ALT 22 07/30/2020   AST 21 07/30/2020   ALKPHOS 72 07/30/2020   BILITOT 0.6 07/30/2020   Lab Results   Component Value Date   HGBA1C 6.0 (H) 10/28/2020   Lab Results  Component Value Date   INSULIN 17.7 10/28/2020   Lab Results  Component Value Date   TSH 1.15 07/30/2020   Lab Results  Component Value Date   CHOL 148 07/30/2020   HDL 38.10 (L) 07/30/2020   LDLCALC 93 07/30/2020   LDLDIRECT 102.0 07/25/2016   TRIG 83.0 07/30/2020   CHOLHDL 4 07/30/2020   Lab Results  Component Value Date   WBC 11.0 (H) 07/30/2020   HGB 13.7 07/30/2020   HCT 41.4 07/30/2020   MCV 83.3 07/30/2020   PLT 256.0 07/30/2020   No results found for: IRON, TIBC, FERRITIN   Attestation Statements:   Reviewed by clinician on day of visit: allergies, medications, problem list, medical history, surgical history, family history, social history, and previous encounter notes.  Edmund Hilda, CMA, am acting as Energy manager for [employer], DO.  I have reviewed the above documentation for accuracy and completeness, and I agree with the above. Corinna Capra, DO

## 2020-12-17 ENCOUNTER — Encounter (INDEPENDENT_AMBULATORY_CARE_PROVIDER_SITE_OTHER): Payer: Self-pay | Admitting: Bariatrics

## 2020-12-17 ENCOUNTER — Other Ambulatory Visit: Payer: Self-pay

## 2020-12-17 ENCOUNTER — Ambulatory Visit (INDEPENDENT_AMBULATORY_CARE_PROVIDER_SITE_OTHER): Payer: 59 | Admitting: Bariatrics

## 2020-12-17 VITALS — BP 126/85 | HR 64 | Temp 98.2°F | Ht 74.0 in | Wt 349.0 lb

## 2020-12-17 DIAGNOSIS — Z6841 Body Mass Index (BMI) 40.0 and over, adult: Secondary | ICD-10-CM

## 2020-12-17 DIAGNOSIS — I10 Essential (primary) hypertension: Secondary | ICD-10-CM

## 2020-12-17 DIAGNOSIS — R7303 Prediabetes: Secondary | ICD-10-CM | POA: Diagnosis not present

## 2020-12-23 NOTE — Progress Notes (Signed)
Chief Complaint:   OBESITY Troy Peterson is here to discuss his progress with his obesity treatment plan along with follow-up of his obesity related diagnoses. Troy Peterson is on the Category 3 Plan and states he is following his eating plan approximately 95-99% of the time. Troy Peterson states he is walking 5,000 steps 5 times per week.   Today's visit was #: 4 Starting weight: 353 lbs Starting date: 10/28/2020 Today's weight: 349 lbs Today's date: 12/17/2020 Total lbs lost to date: 4 Total lbs lost since last in-office visit: 4  Interim History: Troy Peterson is down an additional 4 lbs and doing well overall. He states that he is in a good routine. He is drinking more water.  Subjective:   1. Pre-diabetes Troy Peterson is taking metformin.  2. Essential hypertension Troy Peterson is taking Cozaar. His blood pressure is reasonably well controlled.  Assessment/Plan:   1. Pre-diabetes Troy Peterson will continue metformin, and will continue to work on weight loss, exercise, and decreasing simple carbohydrates to help decrease the risk of diabetes.   2. Essential hypertension Troy Peterson will continue his medications, and will continue working on healthy weight loss and exercise to improve blood pressure control. We will watch for signs of hypotension as he continues his lifestyle modifications.  3. Obesity with current BMI of 44 Troy Peterson is currently in the action stage of change. As such, his goal is to continue with weight loss efforts. He has agreed to the Category 3 Plan.   Intentional eating was discussed. Recipes II handout was given today.  Exercise goals: As is, increase steps.  Behavioral modification strategies: increasing lean protein intake, decreasing simple carbohydrates, increasing vegetables, increasing water intake, decreasing eating out, no skipping meals, meal planning and cooking strategies, keeping healthy foods in the home, and planning for success.  Troy Peterson has agreed to follow-up with our clinic in 2  weeks. He was informed of the importance of frequent follow-up visits to maximize his success with intensive lifestyle modifications for his multiple health conditions.   Objective:   Blood pressure 126/85, pulse 64, temperature 98.2 F (36.8 C), height 6\' 2"  (1.88 m), weight (!) 349 lb (158.3 kg), SpO2 97 %. Body mass index is 44.81 kg/m.  General: Cooperative, alert, well developed, in no acute distress. HEENT: Conjunctivae and lids unremarkable. Cardiovascular: Regular rhythm.  Lungs: Normal work of breathing. Neurologic: No focal deficits.   Lab Results  Component Value Date   CREATININE 0.77 07/30/2020   BUN 13 07/30/2020   NA 138 07/30/2020   K 3.9 07/30/2020   CL 103 07/30/2020   CO2 30 07/30/2020   Lab Results  Component Value Date   ALT 22 07/30/2020   AST 21 07/30/2020   ALKPHOS 72 07/30/2020   BILITOT 0.6 07/30/2020   Lab Results  Component Value Date   HGBA1C 6.0 (H) 10/28/2020   Lab Results  Component Value Date   INSULIN 17.7 10/28/2020   Lab Results  Component Value Date   TSH 1.15 07/30/2020   Lab Results  Component Value Date   CHOL 148 07/30/2020   HDL 38.10 (L) 07/30/2020   LDLCALC 93 07/30/2020   LDLDIRECT 102.0 07/25/2016   TRIG 83.0 07/30/2020   CHOLHDL 4 07/30/2020   Lab Results  Component Value Date   VD25OH 16.0 (L) 10/28/2020   Lab Results  Component Value Date   WBC 11.0 (H) 07/30/2020   HGB 13.7 07/30/2020   HCT 41.4 07/30/2020   MCV 83.3 07/30/2020   PLT 256.0 07/30/2020  No results found for: IRON, TIBC, FERRITIN  Attestation Statements:   Reviewed by clinician on day of visit: allergies, medications, problem list, medical history, surgical history, family history, social history, and previous encounter notes.  Time spent on visit including pre-visit chart review and post-visit care and charting was 20 minutes.    Trude Mcburney, am acting as Energy manager for [employer], DO.  I have reviewed the above  documentation for accuracy and completeness, and I agree with the above. Corinna Capra, DO

## 2020-12-24 ENCOUNTER — Encounter (INDEPENDENT_AMBULATORY_CARE_PROVIDER_SITE_OTHER): Payer: Self-pay | Admitting: Bariatrics

## 2021-01-07 ENCOUNTER — Ambulatory Visit (INDEPENDENT_AMBULATORY_CARE_PROVIDER_SITE_OTHER): Payer: 59 | Admitting: Bariatrics

## 2021-01-07 ENCOUNTER — Other Ambulatory Visit: Payer: Self-pay

## 2021-01-07 ENCOUNTER — Encounter (INDEPENDENT_AMBULATORY_CARE_PROVIDER_SITE_OTHER): Payer: Self-pay | Admitting: Bariatrics

## 2021-01-07 VITALS — BP 143/84 | HR 56 | Temp 97.9°F | Ht 74.0 in | Wt 343.0 lb

## 2021-01-07 DIAGNOSIS — E559 Vitamin D deficiency, unspecified: Secondary | ICD-10-CM

## 2021-01-07 DIAGNOSIS — R7303 Prediabetes: Secondary | ICD-10-CM | POA: Diagnosis not present

## 2021-01-07 DIAGNOSIS — Z9189 Other specified personal risk factors, not elsewhere classified: Secondary | ICD-10-CM

## 2021-01-07 DIAGNOSIS — Z6841 Body Mass Index (BMI) 40.0 and over, adult: Secondary | ICD-10-CM | POA: Diagnosis not present

## 2021-01-07 MED ORDER — VITAMIN D (ERGOCALCIFEROL) 1.25 MG (50000 UNIT) PO CAPS: 50000.0000 [IU] | ORAL_CAPSULE | ORAL | 0 refills | Status: DC

## 2021-01-14 NOTE — Progress Notes (Signed)
Chief Complaint:   OBESITY Troy Peterson is here to discuss his progress with his obesity treatment plan along with follow-up of his obesity related diagnoses. Troy Peterson is on the Category 3 Plan and states he is following his eating plan approximately 98% of the time. Troy Peterson states he is doing 0 for 0 minutes 0 times per week.  Today's visit was #: 5 Starting weight: 353 lbs Starting date: 10/28/2020 Today's weight: 343 lbs Today's date:01/07/2021 Total lbs lost to date: 10 lbs Total lbs lost since last in-office visit: 6 lbs  Interim History: Min is down an additional 6 lbs. He is drinking more water. He is in a routine.  Subjective:   1. Vitamin D deficiency Troy Peterson is taking Vitamin D. His last Vitamin D level was 16.0.  2. Pre-diabetes Troy Peterson is taking Metformin.  3. At risk for osteoporosis Troy Peterson is at higher risk of osteopenia and osteoporosis due to Vitamin D deficiency.   Assessment/Plan:   1. Vitamin D deficiency Low Vitamin D level contributes to fatigue and are associated with obesity, breast, and colon cancer. We will refill his Vitamin D today for 1 month with no refills and he agrees to continue to take prescription Vitamin D 50,000 IU every week and will follow-up for routine testing of Vitamin D, at least 2-3 times per year to avoid over-replacement.  - Vitamin D, Ergocalciferol, (DRISDOL) 1.25 MG (50000 UNIT) CAPS capsule; Take 1 capsule (50,000 Units total) by mouth every 7 (seven) days.  Dispense: 12 capsule; Refill: 0  2. Pre-diabetes Troy Peterson will continue taking his medication and he will continue to work on weight loss, exercise, and decreasing simple carbohydrates to help decrease the risk of diabetes.    3. At risk for osteoporosis    4. Obesity with current BMI of 44 Troy Peterson is currently in the action stage of change. As such, his goal is to continue with weight loss efforts. He has agreed to the Category 3 Plan.   Troy Peterson will continue to meal plan. He  will continue intentional eating and will increase H2O.  Exercise goals:  Increase exercise and play tennis.  Behavioral modification strategies: increasing lean protein intake, decreasing simple carbohydrates, increasing vegetables, increasing water intake, decreasing eating out, no skipping meals, meal planning and cooking strategies, keeping healthy foods in the home, and planning for success.  Troy Peterson has agreed to follow-up with our clinic in 3 weeks. He was informed of the importance of frequent follow-up visits to maximize his success with intensive lifestyle modifications for his multiple health conditions.   Objective:   Blood pressure (!) 143/84, pulse (!) 56, temperature 97.9 F (36.6 C), height 6\' 2"  (1.88 m), weight (!) 343 lb (155.6 kg), SpO2 96 %. Body mass index is 44.04 kg/m.  General: Cooperative, alert, well developed, in no acute distress. HEENT: Conjunctivae and lids unremarkable. Cardiovascular: Regular rhythm.  Lungs: Normal work of breathing. Neurologic: No focal deficits.   Lab Results  Component Value Date   CREATININE 0.77 07/30/2020   BUN 13 07/30/2020   NA 138 07/30/2020   K 3.9 07/30/2020   CL 103 07/30/2020   CO2 30 07/30/2020   Lab Results  Component Value Date   ALT 22 07/30/2020   AST 21 07/30/2020   ALKPHOS 72 07/30/2020   BILITOT 0.6 07/30/2020   Lab Results  Component Value Date   HGBA1C 6.0 (H) 10/28/2020   Lab Results  Component Value Date   INSULIN 17.7 10/28/2020   Lab Results  Component Value Date   TSH 1.15 07/30/2020   Lab Results  Component Value Date   CHOL 148 07/30/2020   HDL 38.10 (L) 07/30/2020   LDLCALC 93 07/30/2020   LDLDIRECT 102.0 07/25/2016   TRIG 83.0 07/30/2020   CHOLHDL 4 07/30/2020   Lab Results  Component Value Date   VD25OH 16.0 (L) 10/28/2020   Lab Results  Component Value Date   WBC 11.0 (H) 07/30/2020   HGB 13.7 07/30/2020   HCT 41.4 07/30/2020   MCV 83.3 07/30/2020   PLT 256.0  07/30/2020   No results found for: IRON, TIBC, FERRITIN  Attestation Statements:   Reviewed by clinician on day of visit: allergies, medications, problem list, medical history, surgical history, family history, social history, and previous encounter notes.  I, Jackson Latino, RMA, am acting as Energy manager for [employer], DO.   I have reviewed the above documentation for accuracy and completeness, and I agree with the above. Corinna Capra, DO

## 2021-01-18 ENCOUNTER — Encounter (INDEPENDENT_AMBULATORY_CARE_PROVIDER_SITE_OTHER): Payer: Self-pay | Admitting: Bariatrics

## 2021-02-01 ENCOUNTER — Other Ambulatory Visit: Payer: Self-pay

## 2021-02-01 ENCOUNTER — Other Ambulatory Visit (INDEPENDENT_AMBULATORY_CARE_PROVIDER_SITE_OTHER): Payer: Self-pay | Admitting: Bariatrics

## 2021-02-01 ENCOUNTER — Ambulatory Visit (INDEPENDENT_AMBULATORY_CARE_PROVIDER_SITE_OTHER): Payer: 59 | Admitting: Bariatrics

## 2021-02-01 ENCOUNTER — Encounter (INDEPENDENT_AMBULATORY_CARE_PROVIDER_SITE_OTHER): Payer: Self-pay | Admitting: Bariatrics

## 2021-02-01 VITALS — BP 134/86 | HR 60 | Temp 98.2°F | Ht 74.0 in | Wt 338.0 lb

## 2021-02-01 DIAGNOSIS — I10 Essential (primary) hypertension: Secondary | ICD-10-CM

## 2021-02-01 DIAGNOSIS — R7303 Prediabetes: Secondary | ICD-10-CM | POA: Diagnosis not present

## 2021-02-01 DIAGNOSIS — Z6841 Body Mass Index (BMI) 40.0 and over, adult: Secondary | ICD-10-CM | POA: Diagnosis not present

## 2021-02-02 ENCOUNTER — Encounter (INDEPENDENT_AMBULATORY_CARE_PROVIDER_SITE_OTHER): Payer: Self-pay | Admitting: Bariatrics

## 2021-02-02 NOTE — Progress Notes (Signed)
Chief Complaint:   OBESITY Troy Peterson is here to discuss his progress with his obesity treatment plan along with follow-up of his obesity related diagnoses. Troy Peterson is on the Category 3 Plan and states he is following his eating plan approximately 97% of the time. Troy Peterson states he is doing 0 minutes 0 times per week.  Today's visit was #: 6 Starting weight: 353 lbs Starting date: 11/07/2020 Today's weight: 338 lbs Today's date:02/01/2021 Total lbs lost to date: 15 lbs Total lbs lost since last in-office visit: 5 lbs  Interim History: Troy Peterson is down an additional 5 lbs. He did not do well or Saturday. He is doing well with water and protein.  Subjective:   1. Pre-diabetes Troy Peterson taking Metformin. He has a normal appetite.  2. Essential hypertension Troy Peterson's hypertension is reasonably well controlled.  Assessment/Plan:   1. Pre-diabetes Troy Peterson will continue his medications. He will continue to work on weight loss, exercise, and decreasing simple carbohydrates to help decrease the risk of diabetes.    2. Essential hypertension Troy Peterson is working on healthy weight loss and exercise to improve blood pressure control. He will continue his medications. We will watch for signs of hypotension as he continues his lifestyle modifications.   3. Obesity with current BMI of 43.4 Troy Peterson is currently in the action stage of change. As such, his goal is to continue with weight loss efforts. He has agreed to the Category 3 Plan.   Troy Peterson will continue meal planning. He will continue intentional eating. He will pack lunch and will continue.  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  Behavioral modification strategies: increasing lean protein intake, decreasing simple carbohydrates, increasing vegetables, increasing water intake, decreasing eating out, no skipping meals, meal planning and cooking strategies, keeping healthy foods in the home, and planning for  success.  Troy Peterson has agreed to follow-up with our clinic in 2 weeks (fasting). He was informed of the importance of frequent follow-up visits to maximize his success with intensive lifestyle modifications for his multiple health conditions.   Objective:   Blood pressure 134/86, pulse 60, temperature 98.2 F (36.8 C), height 6\' 2"  (1.88 m), weight (!) 338 lb (153.3 kg), SpO2 98 %. Body mass index is 43.4 kg/m.  General: Cooperative, alert, well developed, in no acute distress. HEENT: Conjunctivae and lids unremarkable. Cardiovascular: Regular rhythm.  Lungs: Normal work of breathing. Neurologic: No focal deficits.   Lab Results  Component Value Date   CREATININE 0.77 07/30/2020   BUN 13 07/30/2020   NA 138 07/30/2020   K 3.9 07/30/2020   CL 103 07/30/2020   CO2 30 07/30/2020   Lab Results  Component Value Date   ALT 22 07/30/2020   AST 21 07/30/2020   ALKPHOS 72 07/30/2020   BILITOT 0.6 07/30/2020   Lab Results  Component Value Date   HGBA1C 6.0 (H) 10/28/2020   Lab Results  Component Value Date   INSULIN 17.7 10/28/2020   Lab Results  Component Value Date   TSH 1.15 07/30/2020   Lab Results  Component Value Date   CHOL 148 07/30/2020   HDL 38.10 (L) 07/30/2020   LDLCALC 93 07/30/2020   LDLDIRECT 102.0 07/25/2016   TRIG 83.0 07/30/2020   CHOLHDL 4 07/30/2020   Lab Results  Component Value Date   VD25OH 16.0 (L) 10/28/2020   Lab Results  Component Value Date   WBC 11.0 (H) 07/30/2020   HGB 13.7 07/30/2020   HCT  41.4 07/30/2020   MCV 83.3 07/30/2020   PLT 256.0 07/30/2020   No results found for: IRON, TIBC, FERRITIN  Attestation Statements:   Reviewed by clinician on day of visit: allergies, medications, problem list, medical history, surgical history, family history, social history, and previous encounter notes.  Time spent on visit including pre-visit chart review and post-visit care and charting was 20 minutes.   I, Jackson Latino, RMA, am acting  as Energy manager for [employer], DO.   I have reviewed the above documentation for accuracy and completeness, and I agree with the above. Corinna Capra, DO

## 2021-02-18 ENCOUNTER — Ambulatory Visit (INDEPENDENT_AMBULATORY_CARE_PROVIDER_SITE_OTHER): Payer: 59 | Admitting: Bariatrics

## 2021-02-18 ENCOUNTER — Other Ambulatory Visit: Payer: Self-pay

## 2021-02-18 ENCOUNTER — Encounter (INDEPENDENT_AMBULATORY_CARE_PROVIDER_SITE_OTHER): Payer: Self-pay | Admitting: Bariatrics

## 2021-02-18 VITALS — BP 133/88 | HR 74 | Temp 97.5°F | Ht 74.0 in | Wt 335.0 lb

## 2021-02-18 DIAGNOSIS — E559 Vitamin D deficiency, unspecified: Secondary | ICD-10-CM

## 2021-02-18 DIAGNOSIS — Z6841 Body Mass Index (BMI) 40.0 and over, adult: Secondary | ICD-10-CM

## 2021-02-18 DIAGNOSIS — I10 Essential (primary) hypertension: Secondary | ICD-10-CM | POA: Diagnosis not present

## 2021-02-18 DIAGNOSIS — E7800 Pure hypercholesterolemia, unspecified: Secondary | ICD-10-CM | POA: Diagnosis not present

## 2021-02-18 DIAGNOSIS — R7303 Prediabetes: Secondary | ICD-10-CM

## 2021-02-18 MED ORDER — LOSARTAN POTASSIUM-HCTZ 100-12.5 MG PO TABS: 1.0000 | ORAL_TABLET | Freq: Every day | ORAL | 0 refills | Status: DC

## 2021-02-18 MED ORDER — METFORMIN HCL 500 MG PO TABS: 500.0000 mg | ORAL_TABLET | Freq: Every day | ORAL | 0 refills | Status: DC

## 2021-02-19 LAB — COMPREHENSIVE METABOLIC PANEL
ALT: 17 IU/L (ref 0–44)
AST: 20 IU/L (ref 0–40)
Albumin/Globulin Ratio: 1.7 (ref 1.2–2.2)
Albumin: 4.8 g/dL (ref 4.0–5.0)
Alkaline Phosphatase: 108 IU/L (ref 44–121)
BUN/Creatinine Ratio: 16 (ref 9–20)
BUN: 14 mg/dL (ref 6–24)
Bilirubin Total: 0.9 mg/dL (ref 0.0–1.2)
CO2: 24 mmol/L (ref 20–29)
Calcium: 9.6 mg/dL (ref 8.7–10.2)
Chloride: 100 mmol/L (ref 96–106)
Creatinine, Ser: 0.88 mg/dL (ref 0.76–1.27)
Globulin, Total: 2.9 g/dL (ref 1.5–4.5)
Glucose: 82 mg/dL (ref 65–99)
Potassium: 5.1 mmol/L (ref 3.5–5.2)
Sodium: 140 mmol/L (ref 134–144)
Total Protein: 7.7 g/dL (ref 6.0–8.5)
eGFR: 111 mL/min/{1.73_m2} (ref >59)

## 2021-02-19 LAB — LIPID PANEL WITH LDL/HDL RATIO
Cholesterol, Total: 169 mg/dL (ref 100–199)
HDL: 39 mg/dL — ABNORMAL LOW (ref >39)
LDL Chol Calc (NIH): 114 mg/dL — ABNORMAL HIGH (ref 0–99)
LDL/HDL Ratio: 2.9 ratio (ref 0.0–3.6)
Triglycerides: 86 mg/dL (ref 0–149)
VLDL Cholesterol Cal: 16 mg/dL (ref 5–40)

## 2021-02-19 LAB — INSULIN, RANDOM: INSULIN: 8.2 u[IU]/mL (ref 2.6–24.9)

## 2021-02-19 LAB — HEMOGLOBIN A1C
Est. average glucose Bld gHb Est-mCnc: 120 mg/dL
Hgb A1c MFr Bld: 5.8 % — ABNORMAL HIGH (ref 4.8–5.6)

## 2021-02-19 LAB — VITAMIN D 25 HYDROXY (VIT D DEFICIENCY, FRACTURES): Vit D, 25-Hydroxy: 34.4 ng/mL (ref 30.0–100.0)

## 2021-02-22 ENCOUNTER — Encounter (INDEPENDENT_AMBULATORY_CARE_PROVIDER_SITE_OTHER): Payer: Self-pay | Admitting: Bariatrics

## 2021-02-22 DIAGNOSIS — E786 Lipoprotein deficiency: Secondary | ICD-10-CM | POA: Insufficient documentation

## 2021-02-22 NOTE — Progress Notes (Signed)
Chief Complaint:   OBESITY Troy Peterson is here to discuss his progress with his obesity treatment plan along with follow-up of his obesity related diagnoses. Troy Peterson is on the Category 3 Plan and states he is following his eating plan approximately 98% of the time. Troy Peterson states he walked a lot on vacation 0 minutes 0 times per week.  Today's visit was #: 7 Starting weight: 353 lbs Starting date: 11/07/2020 Today's weight: 335 lbs Today's date: 02/18/2021 Total lbs lost to date: 18 lbs Total lbs lost since last in-office visit: 3 lbs  Interim History: Troy Peterson is down 3 lbs and has been steadily losing weight. He drinks more water.  Subjective:   1. Essential hypertension Ladon's hypertension is reasonably well controlled.  2. Pre-diabetes Troy Peterson is currently taking Metformin.  3. Vitamin D deficiency Troy Peterson is currently taking Vitamin D.  4. Elevated cholesterol Medication(s) reviewed. Patient denies myalgias.   Assessment/Plan:   1. Essential hypertension Troy Peterson is working on healthy weight loss and exercise to improve blood pressure control. Troy Peterson checked his blood pressure at home. Troy Peterson will change medication to Losartan/HCTZ 100 - 12.5 mg for 1 month with no refills.We will watch for signs of hypotension as he continues his lifestyle modifications.  - losartan-hydrochlorothiazide (HYZAAR) 100-12.5 MG tablet; Take 1 tablet by mouth daily.  Dispense: 30 tablet; Refill: 0 - Comprehensive metabolic panel  2. Pre-diabetes We will refill Metformin 500 mg for 1 month with no refills. Troy Peterson will continue to work on weight loss, exercise, and decreasing simple carbohydrates to help decrease the risk of diabetes.   - metFORMIN (GLUCOPHAGE) 500 MG tablet; Take 1 tablet (500 mg total) by mouth daily with breakfast.  Dispense: 30 tablet; Refill: 0 - Hemoglobin A1c - Insulin, random  3. Vitamin D deficiency Low Vitamin D level contributes to fatigue and are associated with obesity,  breast, and colon cancer. Troy Peterson agrees to continue to take prescription Vitamin D 50,000 IU every week and he will follow-up for routine testing of Vitamin D, at least 2-3 times per year to avoid over-replacement. We will check Vitamin D today.  - VITAMIN D 25 Hydroxy (Vit-D Deficiency, Fractures)  4. Elevated cholesterol Cardiovascular risk and specific lipid/LDL goals reviewed. We discussed several lifestyle modifications today and Troy Peterson will continue to work on diet, exercise and weight loss efforts. We will check labs today.Orders and follow up as documented in patient record.   Counseling Intensive lifestyle modifications are the first line treatment for this issue. Dietary changes: Increase soluble fiber. Decrease simple carbohydrates. Exercise changes: Moderate to vigorous-intensity aerobic activity 150 minutes per week if tolerated. Lipid-lowering medications: see documented in medical record.  - Comprehensive metabolic panel - Lipid Panel With LDL/HDL Ratio  5. Obesity, current BMI of 43.4 Troy Peterson is currently in the action stage of change. As such, his goal is to continue with weight loss efforts. He has agreed to the Category 3 Plan.   Troy Peterson will stay adherent to the plan. He will keep protein and water intake high.  Exercise goals:  Troy Peterson will continue to walk.  Behavioral modification strategies: increasing lean protein intake, decreasing simple carbohydrates, increasing vegetables, increasing water intake, decreasing eating out, no skipping meals, meal planning and cooking strategies, keeping healthy foods in the home, and planning for success.  Troy Peterson has agreed to follow-up with our clinic in 3 weeks. He was informed of the importance of frequent follow-up visits to maximize his success with intensive lifestyle modifications for his multiple health  conditions.   Troy Peterson was informed we would discuss his lab results at his next visit unless there is a critical issue that  needs to be addressed sooner. Troy Peterson agreed to keep his next visit at the agreed upon time to discuss these results.  Objective:   Blood pressure 133/88, pulse 74, temperature (!) 97.5 F (36.4 C), height 6\' 2"  (1.88 m), weight (!) 335 lb (152 kg), SpO2 98 %. Body mass index is 43.01 kg/m.  General: Cooperative, alert, well developed, in no acute distress. HEENT: Conjunctivae and lids unremarkable. Cardiovascular: Regular rhythm.  Lungs: Normal work of breathing. Neurologic: No focal deficits.   Lab Results  Component Value Date   CREATININE 0.88 02/18/2021   BUN 14 02/18/2021   NA 140 02/18/2021   K 5.1 02/18/2021   CL 100 02/18/2021   CO2 24 02/18/2021   Lab Results  Component Value Date   ALT 17 02/18/2021   AST 20 02/18/2021   ALKPHOS 108 02/18/2021   BILITOT 0.9 02/18/2021   Lab Results  Component Value Date   HGBA1C 5.8 (Troy Peterson) 02/18/2021   HGBA1C 6.0 (Troy Peterson) 10/28/2020   Lab Results  Component Value Date   INSULIN 8.2 02/18/2021   INSULIN 17.7 10/28/2020   Lab Results  Component Value Date   TSH 1.15 07/30/2020   Lab Results  Component Value Date   CHOL 169 02/18/2021   HDL 39 (L) 02/18/2021   LDLCALC 114 (Troy Peterson) 02/18/2021   LDLDIRECT 102.0 07/25/2016   TRIG 86 02/18/2021   CHOLHDL 4 07/30/2020   Lab Results  Component Value Date   VD25OH 34.4 02/18/2021   VD25OH 16.0 (L) 10/28/2020   Lab Results  Component Value Date   WBC 11.0 (Troy Peterson) 07/30/2020   HGB 13.7 07/30/2020   HCT 41.4 07/30/2020   MCV 83.3 07/30/2020   PLT 256.0 07/30/2020   No results found for: IRON, TIBC, FERRITIN  Attestation Statements:   Reviewed by clinician on day of visit: allergies, medications, problem list, medical history, surgical history, family history, social history, and previous encounter notes.  I, 09/27/2020, RMA, am acting as Jackson Latino for Energy manager, DO.   I have reviewed the above documentation for accuracy and completeness, and I agree with the above. [employer], DO

## 2021-03-18 ENCOUNTER — Encounter (INDEPENDENT_AMBULATORY_CARE_PROVIDER_SITE_OTHER): Payer: Self-pay | Admitting: Bariatrics

## 2021-03-18 ENCOUNTER — Other Ambulatory Visit: Payer: Self-pay

## 2021-03-18 ENCOUNTER — Ambulatory Visit (INDEPENDENT_AMBULATORY_CARE_PROVIDER_SITE_OTHER): Payer: 59 | Admitting: Bariatrics

## 2021-03-18 VITALS — BP 123/82 | HR 63 | Temp 97.9°F | Ht 74.0 in | Wt 328.0 lb

## 2021-03-18 DIAGNOSIS — R7303 Prediabetes: Secondary | ICD-10-CM | POA: Diagnosis not present

## 2021-03-18 DIAGNOSIS — I10 Essential (primary) hypertension: Secondary | ICD-10-CM | POA: Diagnosis not present

## 2021-03-18 DIAGNOSIS — Z9189 Other specified personal risk factors, not elsewhere classified: Secondary | ICD-10-CM | POA: Diagnosis not present

## 2021-03-18 DIAGNOSIS — Z6841 Body Mass Index (BMI) 40.0 and over, adult: Secondary | ICD-10-CM

## 2021-03-18 MED ORDER — LOSARTAN POTASSIUM-HCTZ 100-12.5 MG PO TABS: 1.0000 | ORAL_TABLET | Freq: Every day | ORAL | 0 refills | Status: DC

## 2021-03-18 MED ORDER — METFORMIN HCL 500 MG PO TABS: 500.0000 mg | ORAL_TABLET | Freq: Every day | ORAL | 0 refills | Status: DC

## 2021-03-22 ENCOUNTER — Encounter (INDEPENDENT_AMBULATORY_CARE_PROVIDER_SITE_OTHER): Payer: Self-pay | Admitting: Bariatrics

## 2021-03-22 NOTE — Progress Notes (Signed)
Chief Complaint:   OBESITY Troy Peterson is here to discuss his progress with his obesity treatment plan along with follow-up of his obesity related diagnoses. Troy Peterson is on the Category 3 Plan and states he is following his eating plan approximately 97-98% of the time. Troy Peterson states he is is walking for 20 minutes 5 times per week.  Today's visit was #: 8 Starting weight: 353 lbs Starting date: 11/07/2020 Today's weight: 328 lbs Today's date: 03/18/2021 Total lbs lost to date: 25 lbs Total lbs lost since last in-office visit: 7 lbs  Interim History: Troy Peterson is down 7 lbs and doing well overall. He is following the plan. He is doing well with water intake.  Subjective:   1. Essential hypertension Troy Peterson's hypertension is controlled on Hyzaar.   2. Pre-diabetes Troy Peterson is taking Metformin currently. His last A1C level was 5.8. His insulin resistance level was 8.2.   3. At risk of diabetes mellitus Troy Peterson is at risk of diabetes mellitus due to Pre-diabetes.   Assessment/Plan:   1. Essential hypertension Troy Peterson will continue medications. He is working on healthy weight loss and exercise to improve blood pressure control. We will watch for signs of hypotension as he continues his lifestyle modifications.  - losartan-hydrochlorothiazide (HYZAAR) 100-12.5 MG tablet; Take 1 tablet by mouth daily.  Dispense: 30 tablet; Refill: 0  2. Pre-diabetes Troy Peterson will continue Metformin. We will refill Metformin 500 mg for 1 month with no refills. He will continue to work on weight loss, exercise, and decreasing simple carbohydrates to help decrease the risk of diabetes.   - metFORMIN (GLUCOPHAGE) 500 MG tablet; Take 1 tablet (500 mg total) by mouth daily with breakfast.  Dispense: 30 tablet; Refill: 0  3. At risk of diabetes mellitus Troy Peterson was given approximately 15 minutes of diabetes education and counseling today. We discussed intensive lifestyle modifications today with an emphasis on weight  loss as well as increasing exercise and decreasing simple carbohydrates in his diet. We also reviewed medication options with an emphasis on risk versus benefit of those discussed.   Repetitive spaced learning was employed today to elicit superior memory formation and behavioral change.   4. Obesity, current BMI of 42.4 Troy Peterson is currently in the action stage of change. As such, his goal is to continue with weight loss efforts. He has agreed to the Category 3 Plan.   Troy Peterson will continue meal planning and intentional eating. We will discuss labs from 02/18/2021.  Exercise goals:  Random will walk a lunch 20-30 minutes.  Behavioral modification strategies: increasing lean protein intake, decreasing simple carbohydrates, increasing vegetables, increasing water intake, decreasing eating out, no skipping meals, meal planning and cooking strategies, keeping healthy foods in the home, and planning for success.  Troy Peterson has agreed to follow-up with our clinic in 3 weeks. He was informed of the importance of frequent follow-up visits to maximize his success with intensive lifestyle modifications for his multiple health conditions.   Objective:   Blood pressure 123/82, pulse 63, temperature 97.9 F (36.6 C), height 6\' 2"  (1.88 m), weight (!) 328 lb (148.8 kg), SpO2 98 %. Body mass index is 42.11 kg/m.  General: Cooperative, alert, well developed, in no acute distress. HEENT: Conjunctivae and lids unremarkable. Cardiovascular: Regular rhythm.  Lungs: Normal work of breathing. Neurologic: No focal deficits.   Lab Results  Component Value Date   CREATININE 0.88 02/18/2021   BUN 14 02/18/2021   NA 140 02/18/2021   K 5.1 02/18/2021   CL 100  02/18/2021   CO2 24 02/18/2021   Lab Results  Component Value Date   ALT 17 02/18/2021   AST 20 02/18/2021   ALKPHOS 108 02/18/2021   BILITOT 0.9 02/18/2021   Lab Results  Component Value Date   HGBA1C 5.8 (H) 02/18/2021   HGBA1C 6.0 (H) 10/28/2020    Lab Results  Component Value Date   INSULIN 8.2 02/18/2021   INSULIN 17.7 10/28/2020   Lab Results  Component Value Date   TSH 1.15 07/30/2020   Lab Results  Component Value Date   CHOL 169 02/18/2021   HDL 39 (L) 02/18/2021   LDLCALC 114 (H) 02/18/2021   LDLDIRECT 102.0 07/25/2016   TRIG 86 02/18/2021   CHOLHDL 4 07/30/2020   Lab Results  Component Value Date   VD25OH 34.4 02/18/2021   VD25OH 16.0 (L) 10/28/2020   Lab Results  Component Value Date   WBC 11.0 (H) 07/30/2020   HGB 13.7 07/30/2020   HCT 41.4 07/30/2020   MCV 83.3 07/30/2020   PLT 256.0 07/30/2020   No results found for: IRON, TIBC, FERRITIN  Attestation Statements:   Reviewed by clinician on day of visit: allergies, medications, problem list, medical history, surgical history, family history, social history, and previous encounter notes.  I, Jackson Latino, RMA, am acting as Energy manager for [employer], DO.   I have reviewed the above documentation for accuracy and completeness, and I agree with the above. Corinna Capra, DO

## 2021-04-14 ENCOUNTER — Ambulatory Visit (INDEPENDENT_AMBULATORY_CARE_PROVIDER_SITE_OTHER): Payer: 59 | Admitting: Bariatrics

## 2021-04-14 ENCOUNTER — Encounter (INDEPENDENT_AMBULATORY_CARE_PROVIDER_SITE_OTHER): Payer: Self-pay | Admitting: Bariatrics

## 2021-04-14 ENCOUNTER — Other Ambulatory Visit: Payer: Self-pay

## 2021-04-14 VITALS — BP 128/81 | HR 59 | Temp 97.9°F | Ht 74.0 in | Wt 320.0 lb

## 2021-04-14 DIAGNOSIS — E559 Vitamin D deficiency, unspecified: Secondary | ICD-10-CM

## 2021-04-14 DIAGNOSIS — Z6841 Body Mass Index (BMI) 40.0 and over, adult: Secondary | ICD-10-CM

## 2021-04-14 DIAGNOSIS — I10 Essential (primary) hypertension: Secondary | ICD-10-CM

## 2021-04-14 DIAGNOSIS — R7303 Prediabetes: Secondary | ICD-10-CM | POA: Diagnosis not present

## 2021-04-14 MED ORDER — METFORMIN HCL 500 MG PO TABS: 500.0000 mg | ORAL_TABLET | Freq: Every day | ORAL | 0 refills | Status: DC

## 2021-04-14 MED ORDER — VITAMIN D (ERGOCALCIFEROL) 1.25 MG (50000 UNIT) PO CAPS: 50000.0000 [IU] | ORAL_CAPSULE | ORAL | 0 refills | Status: DC

## 2021-04-14 MED ORDER — LOSARTAN POTASSIUM-HCTZ 100-12.5 MG PO TABS: 1.0000 | ORAL_TABLET | Freq: Every day | ORAL | 0 refills | Status: DC

## 2021-04-14 NOTE — Progress Notes (Signed)
Chief Complaint:   OBESITY Troy Peterson is here to discuss his progress with his obesity treatment plan along with follow-up of his obesity related diagnoses. Troy Peterson is on the Category 3 Plan and states he is following his eating plan approximately 99% of the time. Troy Peterson states he is walking for 20-30 minutes 4-5 times per week.  Today's visit was #: 9 Starting weight: 353 lbs Starting date: 11/07/2020 Today's weight: 320 lbs Today's date: 04/14/2021 Total lbs lost to date: 33 lbs Total lbs lost since last in-office visit: 8 lbs  Interim History: Troy Peterson is down another 8 lbs and is doing well overall.  Subjective:   1. Essential hypertension Troy Peterson is taking Hyzaar currently.  2. Pre-diabetes Troy Peterson is currently taking Metformin.   3. Vitamin D deficiency Troy Peterson is taking her medications as directed.   Assessment/Plan:   1. Essential hypertension Troy Peterson will continue with medications. She is working on healthy weight loss and exercise to improve blood pressure control. We will watch for signs of hypotension as he continues his lifestyle modifications.  - losartan-hydrochlorothiazide (HYZAAR) 100-12.5 MG tablet; Take 1 tablet by mouth daily.  Dispense: 30 tablet; Refill: 0  2. Pre-diabetes Troy Peterson will continue to work on weight loss, exercise, and decreasing simple carbohydrates to help decrease the risk of diabetes. We will refill Metformin 500 mg for 1 month with no refills.   - metFORMIN (GLUCOPHAGE) 500 MG tablet; Take 1 tablet (500 mg total) by mouth daily with breakfast.  Dispense: 30 tablet; Refill: 0  3. Vitamin D deficiency Low Vitamin D level contributes to fatigue and are associated with obesity, breast, and colon cancer. We will refill prescription Vitamin D 50,000 IU every week for 1 month with no refills and Troy Peterson will follow-up for routine testing of Vitamin D, at least 2-3 times per year to avoid over-replacement.  - Vitamin D, Ergocalciferol, (DRISDOL) 1.25  MG (50000 UNIT) CAPS capsule; Take 1 capsule (50,000 Units total) by mouth every 7 (seven) days.  Dispense: 12 capsule; Refill: 0  4. Obesity, current BMI of 41.2 Troy Peterson is currently in the action stage of change. As such, his goal is to continue with weight loss efforts. He has agreed to the Category 3 Plan.   Troy Peterson will continue meal planning and intentional eating.  Exercise goals:  As is. Troy Peterson will continue to walk on lunch.  Behavioral modification strategies: increasing lean protein intake, decreasing simple carbohydrates, increasing vegetables, increasing water intake, decreasing eating out, no skipping meals, meal planning and cooking strategies, keeping healthy foods in the home, and planning for success.  Troy Peterson has agreed to follow-up with our clinic in 3 weeks. He was informed of the importance of frequent follow-up visits to maximize his success with intensive lifestyle modifications for his multiple health conditions.   Objective:   Blood pressure 128/81, pulse (!) 59, temperature 97.9 F (36.6 C), height 6\' 2"  (1.88 m), weight (!) 320 lb (145.2 kg), SpO2 98 %. Body mass index is 41.09 kg/m.  General: Cooperative, alert, well developed, in no acute distress. HEENT: Conjunctivae and lids unremarkable. Cardiovascular: Regular rhythm.  Lungs: Normal work of breathing. Neurologic: No focal deficits.   Lab Results  Component Value Date   CREATININE 0.88 02/18/2021   BUN 14 02/18/2021   NA 140 02/18/2021   K 5.1 02/18/2021   CL 100 02/18/2021   CO2 24 02/18/2021   Lab Results  Component Value Date   ALT 17 02/18/2021   AST 20 02/18/2021  ALKPHOS 108 02/18/2021   BILITOT 0.9 02/18/2021   Lab Results  Component Value Date   HGBA1C 5.8 (H) 02/18/2021   HGBA1C 6.0 (H) 10/28/2020   Lab Results  Component Value Date   INSULIN 8.2 02/18/2021   INSULIN 17.7 10/28/2020   Lab Results  Component Value Date   TSH 1.15 07/30/2020   Lab Results  Component Value  Date   CHOL 169 02/18/2021   HDL 39 (L) 02/18/2021   LDLCALC 114 (H) 02/18/2021   LDLDIRECT 102.0 07/25/2016   TRIG 86 02/18/2021   CHOLHDL 4 07/30/2020   Lab Results  Component Value Date   VD25OH 34.4 02/18/2021   VD25OH 16.0 (L) 10/28/2020   Lab Results  Component Value Date   WBC 11.0 (H) 07/30/2020   HGB 13.7 07/30/2020   HCT 41.4 07/30/2020   MCV 83.3 07/30/2020   PLT 256.0 07/30/2020   No results found for: IRON, TIBC, FERRITIN  Attestation Statements:   Reviewed by clinician on day of visit: allergies, medications, problem list, medical history, surgical history, family history, social history, and previous encounter notes.  I, Jackson Latino, RMA, am acting as Energy manager for [employer], DO.   I have reviewed the above documentation for accuracy and completeness, and I agree with the above. Corinna Capra, DO

## 2021-04-18 ENCOUNTER — Encounter (INDEPENDENT_AMBULATORY_CARE_PROVIDER_SITE_OTHER): Payer: Self-pay | Admitting: Bariatrics

## 2021-04-21 ENCOUNTER — Encounter (INDEPENDENT_AMBULATORY_CARE_PROVIDER_SITE_OTHER): Payer: Self-pay

## 2021-05-05 ENCOUNTER — Encounter (INDEPENDENT_AMBULATORY_CARE_PROVIDER_SITE_OTHER): Payer: Self-pay | Admitting: Bariatrics

## 2021-05-05 ENCOUNTER — Other Ambulatory Visit: Payer: Self-pay

## 2021-05-05 ENCOUNTER — Ambulatory Visit (INDEPENDENT_AMBULATORY_CARE_PROVIDER_SITE_OTHER): Payer: 59 | Admitting: Bariatrics

## 2021-05-05 VITALS — BP 111/72 | HR 75 | Temp 97.7°F | Ht 74.0 in | Wt 312.0 lb

## 2021-05-05 DIAGNOSIS — E559 Vitamin D deficiency, unspecified: Secondary | ICD-10-CM | POA: Diagnosis not present

## 2021-05-05 DIAGNOSIS — I10 Essential (primary) hypertension: Secondary | ICD-10-CM

## 2021-05-05 DIAGNOSIS — R7303 Prediabetes: Secondary | ICD-10-CM | POA: Diagnosis not present

## 2021-05-05 DIAGNOSIS — Z6841 Body Mass Index (BMI) 40.0 and over, adult: Secondary | ICD-10-CM

## 2021-05-05 MED ORDER — LOSARTAN POTASSIUM-HCTZ 100-12.5 MG PO TABS: 1.0000 | ORAL_TABLET | Freq: Every day | ORAL | 0 refills | Status: DC

## 2021-05-05 MED ORDER — VITAMIN D (ERGOCALCIFEROL) 1.25 MG (50000 UNIT) PO CAPS: 50000.0000 [IU] | ORAL_CAPSULE | ORAL | 0 refills | Status: DC

## 2021-05-05 MED ORDER — METFORMIN HCL 500 MG PO TABS
500.0000 mg | ORAL_TABLET | Freq: Every day | ORAL | 0 refills | Status: DC
Start: 2021-05-05 — End: 2021-08-02

## 2021-05-06 NOTE — Progress Notes (Signed)
Chief Complaint:   OBESITY Troy Peterson is here to discuss his progress with his obesity treatment plan along with follow-up of his obesity related diagnoses. Troy Peterson is on the Category 3 Plan and states he is following his eating plan approximately 98% of the time. Troy Peterson states he is walking for 20-30 minutes 5 times per week.  Today's visit was #: 10 Starting weight: 353 lbs Starting date: 11/07/2020 Today's weight: 312 lbs Today's date: 05/05/2021 Total lbs lost to date: 41 lbs Total lbs lost since last in-office visit: 8 lbs  Interim History: Troy Peterson is down an additional 8 lbs and doing well overall. He followed the plan. He is getting adequate water.  Subjective:   1. Pre-diabetes Troy Peterson is taking his medications as directed.   2. Vitamin D deficiency Troy Peterson is taking high dose Vitamin D currently.   3. Essential hypertension Troy Peterson's blood pressure is controlled. His last blood pressure was 111/72.   Assessment/Plan:   1. Pre-diabetes Troy Peterson will continue to work on weight loss, exercise, and decreasing simple carbohydrates to help decrease the risk of diabetes. We will refill Metformin 500 with breakfast for 1 month with no refills.   - metFORMIN (GLUCOPHAGE) 500 MG tablet; Take 1 tablet (500 mg total) by mouth daily with breakfast.  Dispense: 30 tablet; Refill: 0  2. Vitamin D deficiency Low Vitamin D level contributes to fatigue and are associated with obesity, breast, and colon cancer. We will refill prescription Vitamin D 50,000 IU every week for 3 months with no refills and Troy Peterson will follow-up for routine testing of Vitamin D, at least 2-3 times per year to avoid over-replacement.  - Vitamin D, Ergocalciferol, (DRISDOL) 1.25 MG (50000 UNIT) CAPS capsule; Take 1 capsule (50,000 Units total) by mouth every 7 (seven) days.  Dispense: 12 capsule; Refill: 0  3. Essential hypertension Troy Peterson is working on healthy weight loss and exercise to improve blood pressure  control.  We will refill Losartan/HTCZ daily for 1 month with no refills. We will watch for signs of hypotension as he continues his lifestyle modifications.  - losartan-hydrochlorothiazide (HYZAAR) 100-12.5 MG tablet; Take 1 tablet by mouth daily.  Dispense: 30 tablet; Refill: 0  4. Obesity, current BMI of 40.1 Troy Peterson is currently in the action stage of change. As such, his goal is to continue with weight loss efforts. He has agreed to the Category 3 Plan.   Troy Peterson will continue meal planning and intentional eating. Strategies for the holidays were provided today.  Exercise goals:  Troy Peterson will continue walking. He will start weight training.  Behavioral modification strategies: increasing lean protein intake, decreasing simple carbohydrates, increasing vegetables, increasing water intake, decreasing eating out, no skipping meals, meal planning and cooking strategies, keeping healthy foods in the home, and planning for success.  Troy Peterson has agreed to follow-up with our clinic in 3 weeks. He was informed of the importance of frequent follow-up visits to maximize his success with intensive lifestyle modifications for his multiple health conditions.   Objective:   Blood pressure 111/72, pulse 75, temperature 97.7 F (36.5 C), height 6\' 2"  (1.88 m), weight (!) 312 lb (141.5 kg), SpO2 96 %. Body mass index is 40.06 kg/m.  General: Cooperative, alert, well developed, in no acute distress. HEENT: Conjunctivae and lids unremarkable. Cardiovascular: Regular rhythm.  Lungs: Normal work of breathing. Neurologic: No focal deficits.   Lab Results  Component Value Date   CREATININE 0.88 02/18/2021   BUN 14 02/18/2021   NA 140 02/18/2021  K 5.1 02/18/2021   CL 100 02/18/2021   CO2 24 02/18/2021   Lab Results  Component Value Date   ALT 17 02/18/2021   AST 20 02/18/2021   ALKPHOS 108 02/18/2021   BILITOT 0.9 02/18/2021   Lab Results  Component Value Date   HGBA1C 5.8 (H) 02/18/2021    HGBA1C 6.0 (H) 10/28/2020   Lab Results  Component Value Date   INSULIN 8.2 02/18/2021   INSULIN 17.7 10/28/2020   Lab Results  Component Value Date   TSH 1.15 07/30/2020   Lab Results  Component Value Date   CHOL 169 02/18/2021   HDL 39 (L) 02/18/2021   LDLCALC 114 (H) 02/18/2021   LDLDIRECT 102.0 07/25/2016   TRIG 86 02/18/2021   CHOLHDL 4 07/30/2020   Lab Results  Component Value Date   VD25OH 34.4 02/18/2021   VD25OH 16.0 (L) 10/28/2020   Lab Results  Component Value Date   WBC 11.0 (H) 07/30/2020   HGB 13.7 07/30/2020   HCT 41.4 07/30/2020   MCV 83.3 07/30/2020   PLT 256.0 07/30/2020   No results found for: IRON, TIBC, FERRITIN  Attestation Statements:   Reviewed by clinician on day of visit: allergies, medications, problem list, medical history, surgical history, family history, social history, and previous encounter notes.  I, Jackson Latino, RMA, am acting as Energy manager for [employer], DO.   I have reviewed the above documentation for accuracy and completeness, and I agree with the above. Corinna Capra, DO

## 2021-05-07 ENCOUNTER — Encounter (INDEPENDENT_AMBULATORY_CARE_PROVIDER_SITE_OTHER): Payer: Self-pay | Admitting: Bariatrics

## 2021-05-17 ENCOUNTER — Other Ambulatory Visit (INDEPENDENT_AMBULATORY_CARE_PROVIDER_SITE_OTHER): Payer: Self-pay | Admitting: Bariatrics

## 2021-05-17 DIAGNOSIS — R7303 Prediabetes: Secondary | ICD-10-CM

## 2021-05-17 DIAGNOSIS — I10 Essential (primary) hypertension: Secondary | ICD-10-CM

## 2021-05-25 ENCOUNTER — Encounter (INDEPENDENT_AMBULATORY_CARE_PROVIDER_SITE_OTHER): Payer: Self-pay

## 2021-05-26 ENCOUNTER — Ambulatory Visit (INDEPENDENT_AMBULATORY_CARE_PROVIDER_SITE_OTHER): Payer: 59 | Admitting: Bariatrics

## 2021-06-29 ENCOUNTER — Other Ambulatory Visit (INDEPENDENT_AMBULATORY_CARE_PROVIDER_SITE_OTHER): Payer: Self-pay | Admitting: Bariatrics

## 2021-06-29 DIAGNOSIS — I10 Essential (primary) hypertension: Secondary | ICD-10-CM

## 2021-06-29 MED ORDER — LOSARTAN POTASSIUM-HCTZ 100-12.5 MG PO TABS: 1.0000 | ORAL_TABLET | Freq: Every day | ORAL | 0 refills | Status: DC

## 2021-06-29 NOTE — Telephone Encounter (Signed)
LAST APPOINTMENT DATE: 05/05/22 NEXT APPOINTMENT DATE: 07/06/21   [id number] - [place], [place] - [address] [place]  [address] Phone: [phone] Fax: [phone]  Patient is requesting a refill of the following medications: Pending Prescriptions:                       Disp   Refills   losartan-hydrochlorothiazide (HYZAAR) 100-*30 tab*0       Sig: Take 1 tablet by mouth daily.   Date last filled: 05/05/22 Previously prescribed by Dr. Owens Shark  Lab Results      Component                Value               Date                      HGBA1C                   5.8 (H)             02/18/2021                HGBA1C                   6.0 (H)             10/28/2020           Lab Results      Component                Value               Date                      LDLCALC                  114 (H)             02/18/2021                CREATININE               0.88                02/18/2021           Lab Results      Component                Value               Date                      VD25OH                   34.4                02/18/2021                VD25OH                   16.0 (L)            10/28/2020            BP Readings from Last 3 Encounters: 05/05/21 : 111/72 04/14/21 : 128/81 03/18/21 : 123/82

## 2021-07-06 ENCOUNTER — Ambulatory Visit (INDEPENDENT_AMBULATORY_CARE_PROVIDER_SITE_OTHER): Payer: 59 | Admitting: Family Medicine

## 2021-07-07 ENCOUNTER — Encounter (INDEPENDENT_AMBULATORY_CARE_PROVIDER_SITE_OTHER): Payer: Self-pay | Admitting: Bariatrics

## 2021-07-07 ENCOUNTER — Ambulatory Visit (INDEPENDENT_AMBULATORY_CARE_PROVIDER_SITE_OTHER): Payer: 59 | Admitting: Bariatrics

## 2021-07-07 ENCOUNTER — Other Ambulatory Visit: Payer: Self-pay

## 2021-07-07 VITALS — BP 114/75 | HR 66 | Temp 97.8°F | Ht 74.0 in | Wt 298.0 lb

## 2021-07-07 DIAGNOSIS — Z6841 Body Mass Index (BMI) 40.0 and over, adult: Secondary | ICD-10-CM

## 2021-07-07 DIAGNOSIS — I10 Essential (primary) hypertension: Secondary | ICD-10-CM

## 2021-07-07 DIAGNOSIS — R7303 Prediabetes: Secondary | ICD-10-CM

## 2021-07-07 DIAGNOSIS — Z6838 Body mass index (BMI) 38.0-38.9, adult: Secondary | ICD-10-CM | POA: Diagnosis not present

## 2021-07-07 MED ORDER — LOSARTAN POTASSIUM-HCTZ 100-12.5 MG PO TABS: 1.0000 | ORAL_TABLET | Freq: Every day | ORAL | 0 refills | Status: DC

## 2021-07-08 NOTE — Progress Notes (Signed)
Chief Complaint:   OBESITY Troy Peterson is here to discuss his progress with his obesity treatment plan along with follow-up of his obesity related diagnoses. Troy Peterson is on the Category 3 Plan and states he is following his eating plan approximately 99% of the time. Troy Peterson states he is walking and running for 30 minutes 5 times per week.  Today's visit was #: 11 Starting weight: 353 lbs Starting date: 11/07/2020 Today's weight: 298 lbs Today's date: 07/07/2021 Total lbs lost to date: 55 lbs Total lbs lost since last in-office visit: 14 lbs  Interim History: Troy Peterson is down another 14 lbs and is doing well overall.  Subjective:   1. Essential hypertension Troy Peterson is taking his medications as directed.  2. Pre-diabetes Troy Peterson is taking Metformin currently.  Assessment/Plan:   1. Essential hypertension We will refill Hyzaar 100-12.5 mg for 1 month with no refills. Jeanette is working on healthy weight loss and exercise to improve blood pressure control. We will watch for signs of hypotension as he continues his lifestyle modifications.  - losartan-hydrochlorothiazide (HYZAAR) 100-12.5 MG tablet; Take 1 tablet by mouth daily.  Dispense: 30 tablet; Refill: 0  2. Pre-diabetes Troy Peterson will continue his medications. He will continue to work on weight loss, exercise, and decreasing simple carbohydrates to help decrease the risk of diabetes.   3. Obesity, current BMI of 38.4 Troy Peterson is currently in the action stage of change. As such, his goal is to continue with weight loss efforts. He has agreed to the Category 3 Plan and keeping a food journal and adhering to recommended goals of 1500 calories and 90-100 grams of protein.   Troy Peterson will continue meal planning and he will continue intentional eating. He will continue to adhere closely to the plan.  Exercise goals:  As is.  Behavioral modification strategies: increasing lean protein intake, decreasing simple carbohydrates, increasing  vegetables, increasing water intake, decreasing eating out, no skipping meals, meal planning and cooking strategies, keeping healthy foods in the home, and planning for success.  Troy Peterson has agreed to follow-up with our clinic in 3-4 weeks (fasting). He was informed of the importance of frequent follow-up visits to maximize his success with intensive lifestyle modifications for his multiple health conditions.   Objective:   Blood pressure 114/75, pulse 66, temperature 97.8 F (36.6 C), height 6\' 2"  (1.88 m), weight 298 lb (135.2 kg), SpO2 98 %. Body mass index is 38.26 kg/m.  General: Cooperative, alert, well developed, in no acute distress. HEENT: Conjunctivae and lids unremarkable. Cardiovascular: Regular rhythm.  Lungs: Normal work of breathing. Neurologic: No focal deficits.   Lab Results  Component Value Date   CREATININE 0.88 02/18/2021   BUN 14 02/18/2021   NA 140 02/18/2021   K 5.1 02/18/2021   CL 100 02/18/2021   CO2 24 02/18/2021   Lab Results  Component Value Date   ALT 17 02/18/2021   AST 20 02/18/2021   ALKPHOS 108 02/18/2021   BILITOT 0.9 02/18/2021   Lab Results  Component Value Date   HGBA1C 5.8 (H) 02/18/2021   HGBA1C 6.0 (H) 10/28/2020   Lab Results  Component Value Date   INSULIN 8.2 02/18/2021   INSULIN 17.7 10/28/2020   Lab Results  Component Value Date   TSH 1.15 07/30/2020   Lab Results  Component Value Date   CHOL 169 02/18/2021   HDL 39 (L) 02/18/2021   LDLCALC 114 (H) 02/18/2021   LDLDIRECT 102.0 07/25/2016   TRIG 86 02/18/2021   CHOLHDL  4 07/30/2020   Lab Results  Component Value Date   VD25OH 34.4 02/18/2021   VD25OH 16.0 (L) 10/28/2020   Lab Results  Component Value Date   WBC 11.0 (H) 07/30/2020   HGB 13.7 07/30/2020   HCT 41.4 07/30/2020   MCV 83.3 07/30/2020   PLT 256.0 07/30/2020   No results found for: IRON, TIBC, FERRITIN  Attestation Statements:   Reviewed by clinician on day of visit: allergies, medications,  problem list, medical history, surgical history, family history, social history, and previous encounter notes.  I, Jackson Latino, RMA, am acting as Energy manager for [employer], DO.  I have reviewed the above documentation for accuracy and completeness, and I agree with the above. Corinna Capra, DO

## 2021-07-11 ENCOUNTER — Encounter (INDEPENDENT_AMBULATORY_CARE_PROVIDER_SITE_OTHER): Payer: Self-pay | Admitting: Bariatrics

## 2021-07-26 ENCOUNTER — Other Ambulatory Visit (INDEPENDENT_AMBULATORY_CARE_PROVIDER_SITE_OTHER): Payer: Self-pay | Admitting: Bariatrics

## 2021-07-26 DIAGNOSIS — I10 Essential (primary) hypertension: Secondary | ICD-10-CM

## 2021-08-02 ENCOUNTER — Encounter (INDEPENDENT_AMBULATORY_CARE_PROVIDER_SITE_OTHER): Payer: Self-pay | Admitting: Bariatrics

## 2021-08-02 ENCOUNTER — Other Ambulatory Visit: Payer: Self-pay

## 2021-08-02 ENCOUNTER — Ambulatory Visit (INDEPENDENT_AMBULATORY_CARE_PROVIDER_SITE_OTHER): Payer: 59 | Admitting: Bariatrics

## 2021-08-02 VITALS — BP 109/75 | HR 69 | Temp 98.1°F | Ht 74.0 in | Wt 295.0 lb

## 2021-08-02 DIAGNOSIS — E559 Vitamin D deficiency, unspecified: Secondary | ICD-10-CM | POA: Diagnosis not present

## 2021-08-02 DIAGNOSIS — R7303 Prediabetes: Secondary | ICD-10-CM | POA: Diagnosis not present

## 2021-08-02 DIAGNOSIS — E7800 Pure hypercholesterolemia, unspecified: Secondary | ICD-10-CM | POA: Diagnosis not present

## 2021-08-02 DIAGNOSIS — E669 Obesity, unspecified: Secondary | ICD-10-CM

## 2021-08-02 DIAGNOSIS — Z6841 Body Mass Index (BMI) 40.0 and over, adult: Secondary | ICD-10-CM

## 2021-08-02 DIAGNOSIS — Z6837 Body mass index (BMI) 37.0-37.9, adult: Secondary | ICD-10-CM

## 2021-08-02 DIAGNOSIS — I10 Essential (primary) hypertension: Secondary | ICD-10-CM | POA: Diagnosis not present

## 2021-08-02 MED ORDER — METFORMIN HCL 500 MG PO TABS: 500.0000 mg | ORAL_TABLET | Freq: Every day | ORAL | 0 refills | Status: DC

## 2021-08-02 MED ORDER — LOSARTAN POTASSIUM-HCTZ 100-12.5 MG PO TABS: 1.0000 | ORAL_TABLET | Freq: Every day | ORAL | 0 refills | Status: DC

## 2021-08-02 MED ORDER — VITAMIN D (ERGOCALCIFEROL) 1.25 MG (50000 UNIT) PO CAPS: 50000.0000 [IU] | ORAL_CAPSULE | ORAL | 0 refills | Status: DC

## 2021-08-03 LAB — HEMOGLOBIN A1C
Est. average glucose Bld gHb Est-mCnc: 120 mg/dL
Hgb A1c MFr Bld: 5.8 % — ABNORMAL HIGH (ref 4.8–5.6)

## 2021-08-03 LAB — COMPREHENSIVE METABOLIC PANEL
ALT: 18 IU/L (ref 0–44)
AST: 22 IU/L (ref 0–40)
Albumin/Globulin Ratio: 1.6 (ref 1.2–2.2)
Albumin: 4.4 g/dL (ref 4.0–5.0)
Alkaline Phosphatase: 84 IU/L (ref 44–121)
BUN/Creatinine Ratio: 18 (ref 9–20)
BUN: 15 mg/dL (ref 6–24)
Bilirubin Total: 0.5 mg/dL (ref 0.0–1.2)
CO2: 27 mmol/L (ref 20–29)
Calcium: 9.3 mg/dL (ref 8.7–10.2)
Chloride: 99 mmol/L (ref 96–106)
Creatinine, Ser: 0.85 mg/dL (ref 0.76–1.27)
Globulin, Total: 2.8 g/dL (ref 1.5–4.5)
Glucose: 79 mg/dL (ref 70–99)
Potassium: 3.9 mmol/L (ref 3.5–5.2)
Sodium: 138 mmol/L (ref 134–144)
Total Protein: 7.2 g/dL (ref 6.0–8.5)
eGFR: 111 mL/min/{1.73_m2} (ref >59)

## 2021-08-03 LAB — LIPID PANEL WITH LDL/HDL RATIO
Cholesterol, Total: 128 mg/dL (ref 100–199)
HDL: 45 mg/dL (ref >39)
LDL Chol Calc (NIH): 71 mg/dL (ref 0–99)
LDL/HDL Ratio: 1.6 ratio (ref 0.0–3.6)
Triglycerides: 52 mg/dL (ref 0–149)
VLDL Cholesterol Cal: 12 mg/dL (ref 5–40)

## 2021-08-03 LAB — VITAMIN D 25 HYDROXY (VIT D DEFICIENCY, FRACTURES): Vit D, 25-Hydroxy: 37.6 ng/mL (ref 30.0–100.0)

## 2021-08-03 LAB — INSULIN, RANDOM: INSULIN: 7 u[IU]/mL (ref 2.6–24.9)

## 2021-08-03 NOTE — Progress Notes (Signed)
Chief Complaint:   OBESITY Troy Peterson is here to discuss his progress with his obesity treatment plan along with follow-up of his obesity related diagnoses. Troy Peterson is on the Category 3 Plan and states he is following his eating plan approximately 97-98% of the time. Troy Peterson states he is walking for 30 minutes 5 times per week.  Today's visit was #: 12 Starting weight: 353 lbs Starting date: 11/07/2020 Today's weight: 294 lbs Today's date: 08/02/2021 Total lbs lost to date: 59 lbs Total lbs lost since last in-office visit: 4 lbs  Interim History: Troy Peterson is down an additional 4 lbs and doing really well overall.   Subjective:   1. Essential hypertension Troy Peterson notes occasionally lightheadedness. His blood pressure is controlled. His last blood pressure was 114/75.  2. Pre-diabetes Troy Peterson's pre-diabetes is stable.   3. Vitamin D deficiency Troy Peterson is taking his medications as directed.   4. Elevated cholesterol Troy Peterson is currently not on medications.  Assessment/Plan:   1. Essential hypertension We will refill Hyzaar 100-12.5 mg for 1 month with no refills. He will take 1/2 tablets daily. We will check CMP today. Troy Peterson is working on healthy weight loss and exercise to improve blood pressure control. We will watch for signs of hypotension as he continues his lifestyle modifications.  - losartan-hydrochlorothiazide (HYZAAR) 100-12.5 MG tablet; Take 1 tablet by mouth daily. (Patient taking differently: Take 0.5 tablets by mouth daily. 1/2 tab)  Dispense: 30 tablet; Refill: 0 - Comprehensive metabolic panel  2. Pre-diabetes We will refill Metformin 500 mg for 1 month with no refills. Heberto will continue to work on weight loss, exercise, and decreasing simple carbohydrates to help decrease the risk of diabetes. We will check Insulin and A1C today.   - metFORMIN (GLUCOPHAGE) 500 MG tablet; Take 1 tablet (500 mg total) by mouth daily with breakfast.  Dispense: 30 tablet; Refill: 0 -  Insulin, random - Hemoglobin A1c  3. Vitamin D deficiency Low Vitamin D level contributes to fatigue and are associated with obesity, breast, and colon cancer. We will refill prescription Vitamin D 50,000 IU every week for 1 month with no refills and Deloss will follow-up for routine testing of Vitamin D, at least 2-3 times per year to avoid over-replacement. We will check Vitamin D today.   - Vitamin D, Ergocalciferol, (DRISDOL) 1.25 MG (50000 UNIT) CAPS capsule; Take 1 capsule (50,000 Units total) by mouth every 7 (seven) days.  Dispense: 12 capsule; Refill: 0 - VITAMIN D 25 Hydroxy (Vit-D Deficiency, Fractures)  4. Elevated cholesterol Cardiovascular risk and specific lipid/LDL goals reviewed.  We will check Lipids today. We discussed several lifestyle modifications today and Troy Peterson will continue to work on diet, exercise and weight loss efforts. Orders and follow up as documented in patient record.   Counseling Intensive lifestyle modifications are the first line treatment for this issue. Dietary changes: Increase soluble fiber. Decrease simple carbohydrates. Exercise changes: Moderate to vigorous-intensity aerobic activity 150 minutes per week if tolerated. Lipid-lowering medications: see documented in medical record.  - Lipid Panel With LDL/HDL Ratio - Comprehensive metabolic panel  5. Obesity, current BMI of 37.8 Troy Peterson is currently in the action stage of change. As such, his goal is to continue with weight loss efforts. He has agreed to the Category 3 Plan and keeping a food journal and adhering to recommended goals of 1500 calories and 90 grams of protein.   Troy Peterson will continue meal planning. He will continue to adhere closely to the plan. He will  start more variety. He has increased his water.   Exercise goals:  As is.  Behavioral modification strategies: increasing lean protein intake, decreasing simple carbohydrates, increasing vegetables, increasing water intake, decreasing  eating out, no skipping meals, meal planning and cooking strategies, keeping healthy foods in the home, and planning for success.  Troy Peterson has agreed to follow-up with our clinic in 3-4 weeks (fasting). He was informed of the importance of frequent follow-up visits to maximize his success with intensive lifestyle modifications for his multiple health conditions.   Objective:   Blood pressure 109/75, pulse 69, temperature 98.1 F (36.7 C), height 6\' 2"  (1.88 m), weight 295 lb (133.8 kg), SpO2 97 %. Body mass index is 37.88 kg/m.  General: Cooperative, alert, well developed, in no acute distress. HEENT: Conjunctivae and lids unremarkable. Cardiovascular: Regular rhythm.  Lungs: Normal work of breathing. Neurologic: No focal deficits.   Lab Results  Component Value Date   CREATININE 0.85 08/02/2021   BUN 15 08/02/2021   NA 138 08/02/2021   K 3.9 08/02/2021   CL 99 08/02/2021   CO2 27 08/02/2021   Lab Results  Component Value Date   ALT 18 08/02/2021   AST 22 08/02/2021   ALKPHOS 84 08/02/2021   BILITOT 0.5 08/02/2021   Lab Results  Component Value Date   HGBA1C 5.8 (H) 08/02/2021   HGBA1C 5.8 (H) 02/18/2021   HGBA1C 6.0 (H) 10/28/2020   Lab Results  Component Value Date   INSULIN 7.0 08/02/2021   INSULIN 8.2 02/18/2021   INSULIN 17.7 10/28/2020   Lab Results  Component Value Date   TSH 1.15 07/30/2020   Lab Results  Component Value Date   CHOL 128 08/02/2021   HDL 45 08/02/2021   LDLCALC 71 08/02/2021   LDLDIRECT 102.0 07/25/2016   TRIG 52 08/02/2021   CHOLHDL 4 07/30/2020   Lab Results  Component Value Date   VD25OH 37.6 08/02/2021   VD25OH 34.4 02/18/2021   VD25OH 16.0 (L) 10/28/2020   Lab Results  Component Value Date   WBC 11.0 (H) 07/30/2020   HGB 13.7 07/30/2020   HCT 41.4 07/30/2020   MCV 83.3 07/30/2020   PLT 256.0 07/30/2020   No results found for: IRON, TIBC, FERRITIN  Attestation Statements:   Reviewed by clinician on day of visit:  allergies, medications, problem list, medical history, surgical history, family history, social history, and previous encounter notes.  I, 09/27/2020, RMA, am acting as Jackson Latino for Energy manager, DO.  I have reviewed the above documentation for accuracy and completeness, and I agree with the above. [employer], DO

## 2021-08-04 ENCOUNTER — Encounter (INDEPENDENT_AMBULATORY_CARE_PROVIDER_SITE_OTHER): Payer: Self-pay | Admitting: Bariatrics

## 2021-08-30 ENCOUNTER — Ambulatory Visit (INDEPENDENT_AMBULATORY_CARE_PROVIDER_SITE_OTHER): Payer: 59 | Admitting: Bariatrics

## 2021-08-30 ENCOUNTER — Other Ambulatory Visit: Payer: Self-pay

## 2021-08-30 ENCOUNTER — Encounter (INDEPENDENT_AMBULATORY_CARE_PROVIDER_SITE_OTHER): Payer: Self-pay | Admitting: Bariatrics

## 2021-08-30 VITALS — BP 125/81 | HR 62 | Temp 98.0°F | Ht 74.0 in | Wt 292.0 lb

## 2021-08-30 DIAGNOSIS — E559 Vitamin D deficiency, unspecified: Secondary | ICD-10-CM

## 2021-08-30 DIAGNOSIS — E669 Obesity, unspecified: Secondary | ICD-10-CM | POA: Diagnosis not present

## 2021-08-30 DIAGNOSIS — R7303 Prediabetes: Secondary | ICD-10-CM

## 2021-08-30 DIAGNOSIS — I10 Essential (primary) hypertension: Secondary | ICD-10-CM

## 2021-08-30 DIAGNOSIS — Z6837 Body mass index (BMI) 37.0-37.9, adult: Secondary | ICD-10-CM

## 2021-08-30 MED ORDER — VITAMIN D (ERGOCALCIFEROL) 1.25 MG (50000 UNIT) PO CAPS: 50000.0000 [IU] | ORAL_CAPSULE | ORAL | 0 refills | Status: DC

## 2021-08-30 MED ORDER — LOSARTAN POTASSIUM-HCTZ 100-12.5 MG PO TABS: 1.0000 | ORAL_TABLET | Freq: Every day | ORAL | 0 refills | Status: DC

## 2021-08-30 MED ORDER — METFORMIN HCL 500 MG PO TABS: 500.0000 mg | ORAL_TABLET | Freq: Every day | ORAL | 0 refills | Status: DC

## 2021-08-31 NOTE — Progress Notes (Signed)
? ? ? ?Chief Complaint:  ? ?OBESITY ?Troy Peterson is here to discuss his progress with his obesity treatment plan along with follow-up of his obesity related diagnoses. Troy Peterson is on the Category 3 Plan and states he is following his eating plan approximately 98% of the time. Troy Peterson states he is walking and running for 30 minutes 5-6 times per week. ? ?Today's visit was #: 13 ?Starting weight: 353 lbs ?Starting date: 11/07/2020 ?Today's weight: 292 lbs ?Today's date: 08/30/2021 ?Total lbs lost to date: 61 lbs ?Total lbs lost since last in-office visit: 3 lbs ? ?Interim History: Troy Peterson is down another 3 lbs and had done well overall.  ? ?Subjective:  ? ?1. Essential hypertension ?Troy Peterson is taking Hyzaar currently.  ? ?2. Pre-diabetes ?Troy Peterson is currently taking Metformin 500 mg.  ? ?3. Vitamin D deficiency ?Troy Peterson is taking Vitamin D currently.  ? ?Assessment/Plan:  ? ?1. Essential hypertension ?Troy Peterson will continue taking Hyzaar. We will refill Hyzaar 100-12.5 mg for 1 month with no refills. Troy Peterson is working on healthy weight loss and exercise to improve blood pressure control. We will watch for signs of hypotension as he continues his lifestyle modifications. ? ?- losartan-hydrochlorothiazide (HYZAAR) 100-12.5 MG tablet; Take 1 tablet by mouth daily.  Dispense: 30 tablet; Refill: 0 ? ?2. Pre-diabetes ?Troy Peterson will continue taking Metformin. He will continue to work on weight loss, exercise, and decreasing simple carbohydrates to help decrease the risk of diabetes.  ? ?- metFORMIN (GLUCOPHAGE) 500 MG tablet; Take 1 tablet (500 mg total) by mouth daily with breakfast.  Dispense: 30 tablet; Refill: 0 ? ?3. Vitamin D deficiency ?Low Vitamin D level contributes to fatigue and are associated with obesity, breast, and colon cancer. We will refill prescription Vitamin D 50,000 IU every week for 1 month with no refills and Troy Peterson will follow-up for routine testing of Vitamin D, at least 2-3 times per year to avoid  over-replacement. ? ?- Vitamin D, Ergocalciferol, (DRISDOL) 1.25 MG (50000 UNIT) CAPS capsule; Take 1 capsule (50,000 Units total) by mouth every 7 (seven) days.  Dispense: 12 capsule; Refill: 0 ? ?4. Obesity, current BMI of 37.5 ?Troy Peterson is currently in the action stage of change. As such, his goal is to continue with weight loss efforts. He has agreed to the Category 3 Plan.  ? ?Troy Peterson will continue meal planning and he will continue intentional eating. He will keep his water intake high.  ? ?Exercise goals:  As is. ? ?Behavioral modification strategies: increasing lean protein intake, decreasing simple carbohydrates, increasing vegetables, increasing water intake, decreasing eating out, no skipping meals, meal planning and cooking strategies, keeping healthy foods in the home, and planning for success. ? ?Troy Peterson has agreed to follow-up with our clinic in 4 weeks (fasting). He was informed of the importance of frequent follow-up visits to maximize his success with intensive lifestyle modifications for his multiple health conditions.  ? ?Objective:  ? ?Blood pressure 125/81, pulse 62, temperature 98 ?F (36.7 ?C), height 6\' 2"  (1.88 m), weight 292 lb (132.5 kg), SpO2 98 %. ?Body mass index is 37.49 kg/m?. ? ?General: Cooperative, alert, well developed, in no acute distress. ?HEENT: Conjunctivae and lids unremarkable. ?Cardiovascular: Regular rhythm.  ?Lungs: Normal work of breathing. ?Neurologic: No focal deficits.  ? ?Lab Results  ?Component Value Date  ? CREATININE 0.85 08/02/2021  ? BUN 15 08/02/2021  ? NA 138 08/02/2021  ? K 3.9 08/02/2021  ? CL 99 08/02/2021  ? CO2 27 08/02/2021  ? ?Lab Results  ?Component  Value Date  ? ALT 18 08/02/2021  ? AST 22 08/02/2021  ? ALKPHOS 84 08/02/2021  ? BILITOT 0.5 08/02/2021  ? ?Lab Results  ?Component Value Date  ? HGBA1C 5.8 (H) 08/02/2021  ? HGBA1C 5.8 (H) 02/18/2021  ? HGBA1C 6.0 (H) 10/28/2020  ? ?Lab Results  ?Component Value Date  ? INSULIN 7.0 08/02/2021  ? INSULIN 8.2  02/18/2021  ? INSULIN 17.7 10/28/2020  ? ?Lab Results  ?Component Value Date  ? TSH 1.15 07/30/2020  ? ?Lab Results  ?Component Value Date  ? CHOL 128 08/02/2021  ? HDL 45 08/02/2021  ? LDLCALC 71 08/02/2021  ? LDLDIRECT 102.0 07/25/2016  ? TRIG 52 08/02/2021  ? CHOLHDL 4 07/30/2020  ? ?Lab Results  ?Component Value Date  ? VD25OH 37.6 08/02/2021  ? VD25OH 34.4 02/18/2021  ? VD25OH 16.0 (L) 10/28/2020  ? ?Lab Results  ?Component Value Date  ? WBC 11.0 (H) 07/30/2020  ? HGB 13.7 07/30/2020  ? HCT 41.4 07/30/2020  ? MCV 83.3 07/30/2020  ? PLT 256.0 07/30/2020  ? ?No results found for: IRON, TIBC, FERRITIN ? ?Attestation Statements:  ? ?Reviewed by clinician on day of visit: allergies, medications, problem list, medical history, surgical history, family history, social history, and previous encounter notes. ? ?I, Jackson Latino, RMA, am acting as transcriptionist for [employer], DO. ? ?I have reviewed the above documentation for accuracy and completeness, and I agree with the above. Corinna Capra, DO ? ?

## 2021-09-01 ENCOUNTER — Encounter (INDEPENDENT_AMBULATORY_CARE_PROVIDER_SITE_OTHER): Payer: Self-pay | Admitting: Bariatrics

## 2021-10-04 ENCOUNTER — Ambulatory Visit (INDEPENDENT_AMBULATORY_CARE_PROVIDER_SITE_OTHER): Payer: 59 | Admitting: Bariatrics

## 2021-10-04 ENCOUNTER — Encounter (INDEPENDENT_AMBULATORY_CARE_PROVIDER_SITE_OTHER): Payer: Self-pay | Admitting: Bariatrics

## 2021-10-04 VITALS — BP 124/74 | HR 61 | Temp 97.3°F | Ht 74.0 in | Wt 287.0 lb

## 2021-10-04 DIAGNOSIS — E559 Vitamin D deficiency, unspecified: Secondary | ICD-10-CM | POA: Diagnosis not present

## 2021-10-04 DIAGNOSIS — R0602 Shortness of breath: Secondary | ICD-10-CM

## 2021-10-04 DIAGNOSIS — R5383 Other fatigue: Secondary | ICD-10-CM

## 2021-10-04 DIAGNOSIS — Z6837 Body mass index (BMI) 37.0-37.9, adult: Secondary | ICD-10-CM

## 2021-10-04 DIAGNOSIS — E669 Obesity, unspecified: Secondary | ICD-10-CM

## 2021-10-04 DIAGNOSIS — R7303 Prediabetes: Secondary | ICD-10-CM | POA: Diagnosis not present

## 2021-10-04 DIAGNOSIS — I10 Essential (primary) hypertension: Secondary | ICD-10-CM | POA: Diagnosis not present

## 2021-10-04 MED ORDER — METFORMIN HCL 500 MG PO TABS: 500.0000 mg | ORAL_TABLET | Freq: Every day | ORAL | 0 refills | Status: DC

## 2021-10-04 MED ORDER — LOSARTAN POTASSIUM-HCTZ 100-12.5 MG PO TABS: 1.0000 | ORAL_TABLET | Freq: Every day | ORAL | 0 refills | Status: DC

## 2021-10-04 MED ORDER — VITAMIN D (ERGOCALCIFEROL) 1.25 MG (50000 UNIT) PO CAPS: 50000.0000 [IU] | ORAL_CAPSULE | ORAL | 0 refills | Status: DC

## 2021-10-06 ENCOUNTER — Encounter (INDEPENDENT_AMBULATORY_CARE_PROVIDER_SITE_OTHER): Payer: Self-pay | Admitting: Bariatrics

## 2021-10-06 NOTE — Progress Notes (Addendum)
? ? ? ?Chief Complaint:  ? ?OBESITY ?Troy Peterson is here to discuss his progress with his obesity treatment plan along with follow-up of his obesity related diagnoses. Troy Peterson is on the Category 3 Plan and states he is following his eating plan approximately 98% of the time. Troy Peterson states he is walking for 30-45 minutes 5 times per week. ? ?Today's visit was #: 14 ?Starting weight: 353 lbs ?Starting date: 11/07/2020 ?Today's weight: 287 lbs ?Today's date: 10/04/2021 ?Total lbs lost to date: 66 lbs ?Total lbs lost since last in-office visit: 5 lbs ? ?Interim History: Troy Peterson is down another 5 lbs since his last visit. He is more active in the afternoon.  ? ?Subjective:  ? ?1. Other fatigue ?Troy Peterson's RMR 5/22 was 2235. His RMR was 3024 today. ? ?2. SOB (shortness of breath) on exertion ?Troy Peterson's RMR 5/22 was 2235. His RMR was 3024 today.  ? ?3. Essential hypertension ?Troy Peterson is taking her medications as directed.  ? ?4. Pre-diabetes ?Troy Peterson agrees to continue Metformin  ? ?5. Vitamin D deficiency ?Troy Peterson is taking Vitamin D currently.  ? ?Assessment/Plan:  ? ?1. Other fatigue ?Troy Peterson does feel that his weight is causing his energy to be lower than it should be. Fatigue may be related to obesity, depression or many other causes. Labs will be ordered, and in the meanwhile, Troy Peterson will focus on self care including making healthy food choices, increasing physical activity and focusing on stress reduction. We discussed IC test today.  ? ?2. SOB (shortness of breath) on exertion ?Troy Peterson does feel that he gets out of breath more easily that he used to when he exercises. Troy Peterson's shortness of breath appears to be obesity related and exercise induced. He has agreed to work on weight loss and gradually increase exercise to treat his exercise induced shortness of breath. Will continue to monitor closely. We discussed IC test today.  ? ?3. Essential hypertension ?We will refill losartan/HCTZ 100-12.5 for 1 month with no refills. He is  working on healthy weight loss and exercise to improve blood pressure control. We will watch for signs of hypotension as he continues his lifestyle modifications. ? ?- losartan-hydrochlorothiazide (HYZAAR) 100-12.5 MG tablet; Take 1 tablet by mouth daily.  Dispense: 30 tablet; Refill: 0 ? ?4. Pre-diabetes ?We will refill Metformin 500 mg for 1 month with no refills. Troy Peterson will continue to work on weight loss, exercise, and decreasing simple carbohydrates to help decrease the risk of diabetes.  ? ?- metFORMIN (GLUCOPHAGE) 500 MG tablet; Take 1 tablet (500 mg total) by mouth daily with breakfast.  Dispense: 30 tablet; Refill: 0 ? ?5. Vitamin D deficiency ?Low Vitamin D level contributes to fatigue and are associated with obesity, breast, and colon cancer. We will refill prescription Vitamin D 50,000 IU every week for 1 month with no refills and he will follow-up for routine testing of Vitamin D, at least 2-3 times per year to avoid over-replacement. ? ?- Vitamin D, Ergocalciferol, (DRISDOL) 1.25 MG (50000 UNIT) CAPS capsule; Take 1 capsule (50,000 Units total) by mouth every 7 (seven) days.  Dispense: 12 capsule; Refill: 0 ? ?6. Obesity, current BMI of 37.0 ?Troy Peterson is currently in the action stage of change. As such, his goal is to continue with weight loss efforts. He has agreed to the Category 3 Plan.  ? ?Troy Peterson will continue meal planning and he will continue intentional eating. He will increase raw vegetables and he will add high protein shakes. ? ?Exercise goals:  Troy Peterson will increase walking.  ? ?  Behavioral modification strategies: increasing lean protein intake, decreasing simple carbohydrates, increasing vegetables, increasing water intake, decreasing eating out, no skipping meals, meal planning and cooking strategies, keeping healthy foods in the home, and planning for success. ? ?Troy Peterson has agreed to follow-up with our clinic in 4-6 weeks. He was informed of the importance of frequent follow-up visits to  maximize his success with intensive lifestyle modifications for his multiple health conditions.  ? ?Objective:  ? ?Blood pressure 124/74, pulse 61, temperature (!) 97.3 ?F (36.3 ?C), height 6\' 2"  (1.88 m), weight 287 lb (130.2 kg), SpO2 98 %. ?Body mass index is 36.85 kg/m?. ? ?General: Cooperative, alert, well developed, in no acute distress. ?HEENT: Conjunctivae and lids unremarkable. ?Cardiovascular: Regular rhythm.  ?Lungs: Normal work of breathing. ?Neurologic: No focal deficits.  ? ?Lab Results  ?Component Value Date  ? CREATININE 0.85 08/02/2021  ? BUN 15 08/02/2021  ? NA 138 08/02/2021  ? K 3.9 08/02/2021  ? CL 99 08/02/2021  ? CO2 27 08/02/2021  ? ?Lab Results  ?Component Value Date  ? ALT 18 08/02/2021  ? AST 22 08/02/2021  ? ALKPHOS 84 08/02/2021  ? BILITOT 0.5 08/02/2021  ? ?Lab Results  ?Component Value Date  ? HGBA1C 5.8 (H) 08/02/2021  ? HGBA1C 5.8 (H) 02/18/2021  ? HGBA1C 6.0 (H) 10/28/2020  ? ?Lab Results  ?Component Value Date  ? INSULIN 7.0 08/02/2021  ? INSULIN 8.2 02/18/2021  ? INSULIN 17.7 10/28/2020  ? ?Lab Results  ?Component Value Date  ? TSH 1.15 07/30/2020  ? ?Lab Results  ?Component Value Date  ? CHOL 128 08/02/2021  ? HDL 45 08/02/2021  ? LDLCALC 71 08/02/2021  ? LDLDIRECT 102.0 07/25/2016  ? TRIG 52 08/02/2021  ? CHOLHDL 4 07/30/2020  ? ?Lab Results  ?Component Value Date  ? VD25OH 37.6 08/02/2021  ? VD25OH 34.4 02/18/2021  ? VD25OH 16.0 (L) 10/28/2020  ? ?Lab Results  ?Component Value Date  ? WBC 11.0 (H) 07/30/2020  ? HGB 13.7 07/30/2020  ? HCT 41.4 07/30/2020  ? MCV 83.3 07/30/2020  ? PLT 256.0 07/30/2020  ? ?No results found for: IRON, TIBC, FERRITIN ? ?Attestation Statements:  ? ?Reviewed by clinician on day of visit: allergies, medications, problem list, medical history, surgical history, family history, social history, and previous encounter notes. ? ?I, 09/27/2020, RMA, am acting as transcriptionist for Jackson Latino, DO. ? ?I have reviewed the above documentation for accuracy  and completeness, and I agree with the above. [employer], DO ? ?

## 2021-11-01 ENCOUNTER — Ambulatory Visit (INDEPENDENT_AMBULATORY_CARE_PROVIDER_SITE_OTHER): Payer: 59 | Admitting: Adult Health

## 2021-11-01 ENCOUNTER — Encounter (INDEPENDENT_AMBULATORY_CARE_PROVIDER_SITE_OTHER): Payer: Self-pay | Admitting: Adult Health

## 2021-11-01 VITALS — BP 110/68 | HR 68 | Temp 98.4°F | Ht 74.0 in | Wt 281.0 lb

## 2021-11-01 DIAGNOSIS — Z9189 Other specified personal risk factors, not elsewhere classified: Secondary | ICD-10-CM

## 2021-11-01 DIAGNOSIS — I10 Essential (primary) hypertension: Secondary | ICD-10-CM

## 2021-11-01 DIAGNOSIS — E669 Obesity, unspecified: Secondary | ICD-10-CM

## 2021-11-01 DIAGNOSIS — Z6836 Body mass index (BMI) 36.0-36.9, adult: Secondary | ICD-10-CM

## 2021-11-01 DIAGNOSIS — R7303 Prediabetes: Secondary | ICD-10-CM | POA: Diagnosis not present

## 2021-11-01 DIAGNOSIS — E559 Vitamin D deficiency, unspecified: Secondary | ICD-10-CM | POA: Diagnosis not present

## 2021-11-01 MED ORDER — LOSARTAN POTASSIUM-HCTZ 100-12.5 MG PO TABS: 1.0000 | ORAL_TABLET | Freq: Every day | ORAL | 0 refills | Status: DC

## 2021-11-02 ENCOUNTER — Ambulatory Visit (INDEPENDENT_AMBULATORY_CARE_PROVIDER_SITE_OTHER): Payer: 59 | Admitting: Physician Assistant

## 2021-11-13 DIAGNOSIS — Z9189 Other specified personal risk factors, not elsewhere classified: Secondary | ICD-10-CM | POA: Insufficient documentation

## 2021-11-13 NOTE — Progress Notes (Signed)
Chief Complaint:   OBESITY Yomar is here to discuss his progress with his obesity treatment plan along with follow-up of his obesity related diagnoses. Ayo is on the Category 3 Plan and states he is following his eating plan approximately 97-98% of the time. March states he is walking 30-40 minutes 5 times per week.  Today's visit was #: 15 Starting weight: 353 lbs Starting date: 11/07/2020 Today's weight: 281 lbs Today's date: 11/01/2021 Total lbs lost to date: 72 lbs Total lbs lost since last in-office visit: 6  Interim History:  10/28/20 RMR 2235 10/04/2021-RMR 3024. Metabolism increased by 789 calories  Subjective:   1. Essential hypertension Blood pressure/heart rate excellent at office visit.  He is on Losartan/HCTZ 100/12.5 mg daily 08/02/21:  CMP electrolytes and kidney failure stable  2. Pre-diabetes 08/02/21:  CMP:  GFR 111, A1c 5.8.   He is inconsistently taking Metformin 500 mg daily at lunch and experiencing evening CHO cravings.  3. Vitamin D deficiency 08/02/21:  Vitamin D level 37.6, below goal of 50-70. He is on ergocalciferol  denies nausea, vomiting or muscle weakness.  Has been inconsistently taking Rx strength Vit D.  4. At risk for complication associated with hypotension  The patient is at a higher than average risk of hypotension due to weight loss and steady weight loss.   Assessment/Plan:   1. Essential hypertension Refill Losartan/HCTZ 100/12.5 mg daily.  See below. Monitor blood pressure at home, bring log.  - losartan-hydrochlorothiazide (HYZAAR) 100-12.5 MG tablet; Take 1 tablet by mouth daily.  Dispense: 30 tablet; Refill: 0  2. Pre-diabetes Take metformin 500 mg at dinner.  3. Vitamin D deficiency Take Ergocalciferol weekly.   4. At risk for complication associated with hypotension [place]on was given approximately 15 minutes of education and counseling today to help avoid hypotension. We discussed risks of hypotension with  weight loss and signs of hypotension such as feeling lightheaded or unsteady.  Repetitive spaced learning was employed today to elicit superior memory formation and behavioral change.   5. Obesity, current BMI of 36.2 Change from Category 3 to Category 4 meal plan due to increased RMR 3024. Take Metformin 500 mg at dinner.  Kalen is currently in the action stage of change. As such, his goal is to continue with weight loss efforts. He has agreed to the Category 4 Plan.   Exercise goals:  As is.  Behavioral modification strategies: increasing lean protein intake, decreasing simple carbohydrates, meal planning and cooking strategies, keeping healthy foods in the home, and avoiding temptations.  Ciro has agreed to follow-up with our clinic in 4 weeks. He was informed of the importance of frequent follow-up visits to maximize his success with intensive lifestyle modifications for his multiple health conditions.   Objective:   Blood pressure 110/68, pulse 68, temperature 98.4 F (36.9 C), height 6\' 2"  (1.88 m), weight 281 lb (127.5 kg), SpO2 96 %. Body mass index is 36.08 kg/m.  General: Cooperative, alert, well developed, in no acute distress. HEENT: Conjunctivae and lids unremarkable. Cardiovascular: Regular rhythm.  Lungs: Normal work of breathing. Neurologic: No focal deficits.   Lab Results  Component Value Date   CREATININE 0.85 08/02/2021   BUN 15 08/02/2021   NA 138 08/02/2021   K 3.9 08/02/2021   CL 99 08/02/2021   CO2 27 08/02/2021   Lab Results  Component Value Date   ALT 18 08/02/2021   AST 22 08/02/2021   ALKPHOS 84 08/02/2021   BILITOT 0.5 08/02/2021  Lab Results  Component Value Date   HGBA1C 5.8 (H) 08/02/2021   HGBA1C 5.8 (H) 02/18/2021   HGBA1C 6.0 (H) 10/28/2020   Lab Results  Component Value Date   INSULIN 7.0 08/02/2021   INSULIN 8.2 02/18/2021   INSULIN 17.7 10/28/2020   Lab Results  Component Value Date   TSH 1.15 07/30/2020   Lab  Results  Component Value Date   CHOL 128 08/02/2021   HDL 45 08/02/2021   LDLCALC 71 08/02/2021   LDLDIRECT 102.0 07/25/2016   TRIG 52 08/02/2021   CHOLHDL 4 07/30/2020   Lab Results  Component Value Date   VD25OH 37.6 08/02/2021   VD25OH 34.4 02/18/2021   VD25OH 16.0 (L) 10/28/2020   Lab Results  Component Value Date   WBC 11.0 (H) 07/30/2020   HGB 13.7 07/30/2020   HCT 41.4 07/30/2020   MCV 83.3 07/30/2020   PLT 256.0 07/30/2020   No results found for: IRON, TIBC, FERRITIN  Attestation Statements:   Reviewed by clinician on day of visit: allergies, medications, problem list, medical history, surgical history, family history, social history, and previous encounter notes.  I, Malcolm Metro, RMA, am acting as Energy manager for William Hamburger, NP.  I have reviewed the above documentation for accuracy and completeness, and I agree with the above. -  Neola Worrall d. Carmelina Balducci, NP-C

## 2021-11-29 ENCOUNTER — Encounter (INDEPENDENT_AMBULATORY_CARE_PROVIDER_SITE_OTHER): Payer: Self-pay | Admitting: Bariatrics

## 2021-11-29 ENCOUNTER — Ambulatory Visit (INDEPENDENT_AMBULATORY_CARE_PROVIDER_SITE_OTHER): Payer: 59 | Admitting: Bariatrics

## 2021-11-29 VITALS — BP 118/78 | HR 73 | Temp 98.2°F | Ht 74.0 in | Wt 290.0 lb

## 2021-11-29 DIAGNOSIS — Z6837 Body mass index (BMI) 37.0-37.9, adult: Secondary | ICD-10-CM | POA: Diagnosis not present

## 2021-11-29 DIAGNOSIS — R7303 Prediabetes: Secondary | ICD-10-CM

## 2021-11-29 DIAGNOSIS — I10 Essential (primary) hypertension: Secondary | ICD-10-CM | POA: Diagnosis not present

## 2021-11-29 DIAGNOSIS — E668 Other obesity: Secondary | ICD-10-CM

## 2021-11-29 DIAGNOSIS — Z7984 Long term (current) use of oral hypoglycemic drugs: Secondary | ICD-10-CM

## 2021-11-29 MED ORDER — METFORMIN HCL 500 MG PO TABS: 500.0000 mg | ORAL_TABLET | Freq: Two times a day (BID) | ORAL | 0 refills | Status: DC

## 2021-11-30 NOTE — Progress Notes (Signed)
Chief Complaint:   OBESITY Troy Peterson is here to discuss his progress with his obesity treatment plan along with follow-up of his obesity related diagnoses. Troy Peterson is on the Category 3 Plan and states he is following his eating plan approximately 98% of the time. Troy Peterson states he is walking for 30 minutes 5 times per week.  Today's visit was #: 16 Starting weight: 353 lbs Starting date: 5//14/2022 Today's weight: 290 lbs Today's date: 11/29/2021 Total lbs lost to date: 63 lbs Total lbs lost since last in-office visit: 0  Interim History: Troy Peterson is up 9 lbs since his last visit, but has done very well previously. He was changed from Category 3 to Category 4 daily to increase in RMR (indirect calorimetry).   Subjective:   1. Pre-diabetes Troy Peterson is taking Metformin currently.  2. Essential hypertension Troy Peterson's blood pressure is controlled. His blood pressure today is 118/78.  Assessment/Plan:   1. Pre-diabetes Troy Peterson will continue Metformin 500 mg for 1 month with no refills. He will continue to work on weight loss, exercise, and decreasing simple carbohydrates to help decrease the risk of diabetes.   - metFORMIN (GLUCOPHAGE) 500 MG tablet; Take 1 tablet (500 mg total) by mouth 2 (two) times daily with a meal.  Dispense: 60 tablet; Refill: 0  2. Essential hypertension Troy Peterson will continue his medications. He is working on healthy weight loss and exercise to improve blood pressure control. We will watch for signs of hypotension as he continues his lifestyle modifications.  3. Obesity, current BMI of 37.2 Troy Peterson is currently in the action stage of change. As such, his goal is to continue with weight loss efforts. He has agreed to the Category 3 Plan.   Exercise goals:  As is. Troy Peterson will join the gym.   Behavioral modification strategies: increasing lean protein intake, decreasing simple carbohydrates, increasing vegetables, increasing water intake, decreasing eating out, no  skipping meals, meal planning and cooking strategies, keeping healthy foods in the home, and planning for success.  Troy Peterson has agreed to follow-up with our clinic in 3 weeks (fasting). He was informed of the importance of frequent follow-up visits to maximize his success with intensive lifestyle modifications for his multiple health conditions.   Objective:   Blood pressure 118/78, pulse 73, temperature 98.2 F (36.8 C), height 6\' 2"  (1.88 m), weight 290 lb (131.5 kg), SpO2 97 %. Body mass index is 37.23 kg/m.  General: Cooperative, alert, well developed, in no acute distress. HEENT: Conjunctivae and lids unremarkable. Cardiovascular: Regular rhythm.  Lungs: Normal work of breathing. Neurologic: No focal deficits.   Lab Results  Component Value Date   CREATININE 0.85 08/02/2021   BUN 15 08/02/2021   NA 138 08/02/2021   K 3.9 08/02/2021   CL 99 08/02/2021   CO2 27 08/02/2021   Lab Results  Component Value Date   ALT 18 08/02/2021   AST 22 08/02/2021   ALKPHOS 84 08/02/2021   BILITOT 0.5 08/02/2021   Lab Results  Component Value Date   HGBA1C 5.8 (H) 08/02/2021   HGBA1C 5.8 (H) 02/18/2021   HGBA1C 6.0 (H) 10/28/2020   Lab Results  Component Value Date   INSULIN 7.0 08/02/2021   INSULIN 8.2 02/18/2021   INSULIN 17.7 10/28/2020   Lab Results  Component Value Date   TSH 1.15 07/30/2020   Lab Results  Component Value Date   CHOL 128 08/02/2021   HDL 45 08/02/2021   LDLCALC 71 08/02/2021   LDLDIRECT 102.0 07/25/2016  TRIG 52 08/02/2021   CHOLHDL 4 07/30/2020   Lab Results  Component Value Date   VD25OH 37.6 08/02/2021   VD25OH 34.4 02/18/2021   VD25OH 16.0 (L) 10/28/2020   Lab Results  Component Value Date   WBC 11.0 (H) 07/30/2020   HGB 13.7 07/30/2020   HCT 41.4 07/30/2020   MCV 83.3 07/30/2020   PLT 256.0 07/30/2020   No results found for: IRON, TIBC, FERRITIN  Attestation Statements:   Reviewed by clinician on day of visit: allergies,  medications, problem list, medical history, surgical history, family history, social history, and previous encounter notes.  I, Jackson Latino, RMA, am acting as Energy manager for [employer], DO.  I have reviewed the above documentation for accuracy and completeness, and I agree with the above. Corinna Capra, DO

## 2021-12-01 ENCOUNTER — Encounter: Payer: Self-pay | Admitting: Family

## 2021-12-01 ENCOUNTER — Ambulatory Visit: Payer: 59 | Admitting: Family

## 2021-12-01 VITALS — BP 135/81 | HR 61 | Temp 98.0°F | Ht 74.0 in | Wt 287.0 lb

## 2021-12-01 DIAGNOSIS — R300 Dysuria: Secondary | ICD-10-CM | POA: Diagnosis not present

## 2021-12-01 LAB — POCT URINALYSIS DIPSTICK
Bilirubin, UA: NEGATIVE
Blood, UA: NEGATIVE
Glucose, UA: NEGATIVE
Ketones, UA: NEGATIVE
Leukocytes, UA: NEGATIVE
Nitrite, UA: NEGATIVE
Protein, UA: NEGATIVE
Spec Grav, UA: 1.02 (ref 1.010–1.025)
Urobilinogen, UA: 0.2 E.U./dL
pH, UA: 6 (ref 5.0–8.0)

## 2021-12-01 MED ORDER — FLUCONAZOLE 150 MG PO TABS: ORAL_TABLET | ORAL | 0 refills | Status: DC

## 2021-12-01 NOTE — Patient Instructions (Signed)
It was very nice to see you today!  Your urine is not showing any infection today.  Continue drinking your water, but don't go much over a gallon daily.  The burning you are feeling could be yeast, so I have sent over 2 pills of Fluconazole. You take 1 pill today, and another in 3 days.  Let us know if your symptoms are not resolving.     PLEASE NOTE:  If you had any lab tests please let us know if you have not heard back within a few days. You may see your results on MyChart before we have a chance to review them but we will give you a call once they are reviewed by [place]. If we ordered any referrals today, please let us know if you have not heard from their office within the next week.

## 2021-12-01 NOTE — Progress Notes (Signed)
Subjective:     Patient ID: Troy Peterson, male    DOB: Mar 20, 1981, 41 y.o.   MRN: [id number]  Chief Complaint  Patient presents with   Dysuria    Pt c/o pain during urination and pain in lower back for a couple of days.    HPI: Urinary symptoms: Patient c/o  dysuria, low back pain, denies foul odor, cloudy urine, or hematuria. Other sx: no penile d/c , no penile itching. Duration of sx: 2 days; Home tx: none; Denies  nausea, fever. Reports last UTI never.    Assessment & Plan:   Problem List Items Addressed This Visit   None Visit Diagnoses     Dysuria    -  Primary UA negative. pt denies any risk of STI, denies any skin irritation or erythema. Pt reports during an erection he also feels the burning, reports he and wife trying anal intercourse recently. Will send Diflucan to rule out yeast, advised pt on use & SE.    Relevant Medications   fluconazole (DIFLUCAN) 150 MG tablet   Other Relevant Orders   POCT Urinalysis Dipstick (Completed)      Outpatient Medications Prior to Visit  Medication Sig Dispense Refill   losartan-hydrochlorothiazide (HYZAAR) 100-12.5 MG tablet Take 1 tablet by mouth daily. 30 tablet 0   metFORMIN (GLUCOPHAGE) 500 MG tablet Take 1 tablet (500 mg total) by mouth 2 (two) times daily with a meal. 60 tablet 0   Vitamin D, Ergocalciferol, (DRISDOL) 1.25 MG (50000 UNIT) CAPS capsule Take 1 capsule (50,000 Units total) by mouth every 7 (seven) days. 12 capsule 0   No facility-administered medications prior to visit.    Past Medical History:  Diagnosis Date   Hypertension    OSA (obstructive sleep apnea) 01/09/2018    Past Surgical History:  Procedure Laterality Date   HIP SURGERY  6th grade   pins in right hip    No Known Allergies     Objective:    Physical Exam Vitals and nursing note reviewed.  Constitutional:      General: He is not in acute distress.    Appearance: Normal appearance.  HENT:     Head: Normocephalic.   Cardiovascular:     Rate and Rhythm: Normal rate and regular rhythm.  Pulmonary:     Effort: Pulmonary effort is normal.     Breath sounds: Normal breath sounds.  Musculoskeletal:        General: Normal range of motion.     Cervical back: Normal range of motion.  Skin:    General: Skin is warm and dry.  Neurological:     Mental Status: He is alert and oriented to person, place, and time.  Psychiatric:        Mood and Affect: Mood normal.    BP 135/81 (BP Location: Left Arm, Patient Position: Sitting, Cuff Size: Large)   Pulse 61   Temp 98 F (36.7 C) (Temporal)   Ht 6\' 2"  (1.88 m)   Wt 287 lb (130.2 kg)   SpO2 99%   BMI 36.85 kg/m  Wt Readings from Last 3 Encounters:  12/01/21 287 lb (130.2 kg)  11/29/21 290 lb (131.5 kg)  11/01/21 281 lb (127.5 kg)   Meds ordered this encounter  Medications   fluconazole (DIFLUCAN) 150 MG tablet    Sig: Take 1 pill today and the 2nd pill in 3 days.    Dispense:  2 tablet    Refill:  0  Order Specific Question:   Supervising Provider    Answer:   ANDY, CAMILLE L [id number]    Jeanie Sewer, NP

## 2021-12-06 ENCOUNTER — Encounter (INDEPENDENT_AMBULATORY_CARE_PROVIDER_SITE_OTHER): Payer: Self-pay | Admitting: Bariatrics

## 2021-12-20 ENCOUNTER — Encounter (INDEPENDENT_AMBULATORY_CARE_PROVIDER_SITE_OTHER): Payer: Self-pay | Admitting: Bariatrics

## 2021-12-20 ENCOUNTER — Ambulatory Visit (INDEPENDENT_AMBULATORY_CARE_PROVIDER_SITE_OTHER): Payer: 59 | Admitting: Bariatrics

## 2021-12-20 VITALS — BP 111/71 | HR 63 | Temp 97.7°F | Ht 74.0 in | Wt 281.0 lb

## 2021-12-20 DIAGNOSIS — I10 Essential (primary) hypertension: Secondary | ICD-10-CM

## 2021-12-20 DIAGNOSIS — R7303 Prediabetes: Secondary | ICD-10-CM | POA: Diagnosis not present

## 2021-12-20 DIAGNOSIS — Z7984 Long term (current) use of oral hypoglycemic drugs: Secondary | ICD-10-CM

## 2021-12-20 DIAGNOSIS — E669 Obesity, unspecified: Secondary | ICD-10-CM

## 2021-12-20 DIAGNOSIS — E559 Vitamin D deficiency, unspecified: Secondary | ICD-10-CM | POA: Diagnosis not present

## 2021-12-20 DIAGNOSIS — Z6836 Body mass index (BMI) 36.0-36.9, adult: Secondary | ICD-10-CM

## 2021-12-20 MED ORDER — LOSARTAN POTASSIUM-HCTZ 100-12.5 MG PO TABS: 1.0000 | ORAL_TABLET | Freq: Every day | ORAL | 0 refills | Status: DC

## 2021-12-20 MED ORDER — METFORMIN HCL 500 MG PO TABS: 500.0000 mg | ORAL_TABLET | Freq: Two times a day (BID) | ORAL | 0 refills | Status: DC

## 2021-12-21 ENCOUNTER — Other Ambulatory Visit (INDEPENDENT_AMBULATORY_CARE_PROVIDER_SITE_OTHER): Payer: Self-pay | Admitting: Bariatrics

## 2021-12-21 ENCOUNTER — Other Ambulatory Visit (INDEPENDENT_AMBULATORY_CARE_PROVIDER_SITE_OTHER): Payer: Self-pay | Admitting: Adult Health

## 2021-12-21 DIAGNOSIS — I10 Essential (primary) hypertension: Secondary | ICD-10-CM

## 2021-12-21 DIAGNOSIS — R7303 Prediabetes: Secondary | ICD-10-CM

## 2021-12-21 LAB — COMPREHENSIVE METABOLIC PANEL
ALT: 22 IU/L (ref 0–44)
AST: 22 IU/L (ref 0–40)
Albumin/Globulin Ratio: 1.8 (ref 1.2–2.2)
Albumin: 4.6 g/dL (ref 4.0–5.0)
Alkaline Phosphatase: 81 IU/L (ref 44–121)
BUN/Creatinine Ratio: 19 (ref 9–20)
BUN: 14 mg/dL (ref 6–24)
Bilirubin Total: 0.6 mg/dL (ref 0.0–1.2)
CO2: 24 mmol/L (ref 20–29)
Calcium: 9.3 mg/dL (ref 8.7–10.2)
Chloride: 103 mmol/L (ref 96–106)
Creatinine, Ser: 0.73 mg/dL — ABNORMAL LOW (ref 0.76–1.27)
Globulin, Total: 2.5 g/dL (ref 1.5–4.5)
Glucose: 80 mg/dL (ref 70–99)
Potassium: 4.3 mmol/L (ref 3.5–5.2)
Sodium: 144 mmol/L (ref 134–144)
Total Protein: 7.1 g/dL (ref 6.0–8.5)
eGFR: 116 mL/min/{1.73_m2} (ref >59)

## 2021-12-21 LAB — HEMOGLOBIN A1C
Est. average glucose Bld gHb Est-mCnc: 114 mg/dL
Hgb A1c MFr Bld: 5.6 % (ref 4.8–5.6)

## 2021-12-21 LAB — VITAMIN D 25 HYDROXY (VIT D DEFICIENCY, FRACTURES): Vit D, 25-Hydroxy: 29.9 ng/mL — ABNORMAL LOW (ref 30.0–100.0)

## 2021-12-21 LAB — LIPID PANEL WITH LDL/HDL RATIO
Cholesterol, Total: 131 mg/dL (ref 100–199)
HDL: 48 mg/dL (ref >39)
LDL Chol Calc (NIH): 68 mg/dL (ref 0–99)
LDL/HDL Ratio: 1.4 ratio (ref 0.0–3.6)
Triglycerides: 78 mg/dL (ref 0–149)
VLDL Cholesterol Cal: 15 mg/dL (ref 5–40)

## 2021-12-21 LAB — INSULIN, RANDOM: INSULIN: 7.2 u[IU]/mL (ref 2.6–24.9)

## 2021-12-21 NOTE — Progress Notes (Signed)
Chief Complaint:   OBESITY Troy Peterson is here to discuss his progress with his obesity treatment plan along with follow-up of his obesity related diagnoses. Troy Peterson is on the Category 3 Plan and states he is following his eating plan approximately 98% of the time. Troy Peterson states he is walking for 30 minutes 5 times per week.  Today's visit was #: 17 Starting weight: 353 lbs Starting date: 11/07/2020 Today's weight: 281 lbs Today's date: 12/20/2021 Total lbs lost to date: 72 Total lbs lost since last in-office visit: 9  Interim History: Troy Peterson is down another 9 pounds since his last visit, and he is doing well overall.  Subjective:   1. Essential hypertension Troy Peterson is taking Hyzaar.  His blood pressure is 111/71.  2. Pre-diabetes Troy Peterson is taking metformin currently.  3. Vitamin D deficiency Troy Peterson is taking vitamin D prescription currently.  Assessment/Plan:   1. Essential hypertension We will check labs today.  Troy Peterson will continue his medications, and we will refill Hyzaar for 1 month.  We will follow-up on labs at his next visit.  - losartan-hydrochlorothiazide (HYZAAR) 100-12.5 MG tablet; Take 1 tablet by mouth daily.  Dispense: 30 tablet; Refill: 0 - Comprehensive metabolic panel - Lipid Panel With LDL/HDL Ratio  2. Pre-diabetes We will check labs today, and we will refill metformin for 1 month.  We will follow-up on labs at his next visit.  - metFORMIN (GLUCOPHAGE) 500 MG tablet; Take 1 tablet (500 mg total) by mouth 2 (two) times daily with a meal.  Dispense: 60 tablet; Refill: 0 - Hemoglobin A1c - Insulin, random  3. Vitamin D deficiency Troy Peterson will continue prescription vitamin D.  We will check labs today, and we will follow-up at his next visit.  - VITAMIN D 25 Hydroxy (Vit-D Deficiency, Fractures)  4. Obesity, current BMI of 36.2 Troy Peterson is currently in the action stage of change. As such, his goal is to continue with weight loss efforts. He has agreed to  the Category 3 Plan.   Meal planning and intentional eating were discussed.  Troy Peterson is to increase his water and protein intake.  Exercise goals: As is.  Behavioral modification strategies: increasing lean protein intake, decreasing simple carbohydrates, increasing vegetables, increasing water intake, decreasing eating out, no skipping meals, meal planning and cooking strategies, keeping healthy foods in the home, and planning for success.  Troy Peterson has agreed to follow-up with our clinic in 4 weeks. He was informed of the importance of frequent follow-up visits to maximize his success with intensive lifestyle modifications for his multiple health conditions.   Troy Peterson was informed we would discuss his lab results at his next visit unless there is a critical issue that needs to be addressed sooner. Troy Peterson agreed to keep his next visit at the agreed upon time to discuss these results.  Objective:   Blood pressure 111/71, pulse 63, temperature 97.7 F (36.5 C), height 6\' 2"  (1.88 m), weight 281 lb (127.5 kg), SpO2 96 %. Body mass index is 36.08 kg/m.  General: Cooperative, alert, well developed, in no acute distress. HEENT: Conjunctivae and lids unremarkable. Cardiovascular: Regular rhythm.  Lungs: Normal work of breathing. Neurologic: No focal deficits.   Lab Results  Component Value Date   CREATININE 0.73 (L) 12/20/2021   BUN 14 12/20/2021   NA 144 12/20/2021   K 4.3 12/20/2021   CL 103 12/20/2021   CO2 24 12/20/2021   Lab Results  Component Value Date   ALT 22 12/20/2021   AST  22 12/20/2021   ALKPHOS 81 12/20/2021   BILITOT 0.6 12/20/2021   Lab Results  Component Value Date   HGBA1C 5.6 12/20/2021   HGBA1C 5.8 (H) 08/02/2021   HGBA1C 5.8 (H) 02/18/2021   HGBA1C 6.0 (H) 10/28/2020   Lab Results  Component Value Date   INSULIN 7.2 12/20/2021   INSULIN 7.0 08/02/2021   INSULIN 8.2 02/18/2021   INSULIN 17.7 10/28/2020   Lab Results  Component Value Date   TSH 1.15  07/30/2020   Lab Results  Component Value Date   CHOL 131 12/20/2021   HDL 48 12/20/2021   LDLCALC 68 12/20/2021   LDLDIRECT 102.0 07/25/2016   TRIG 78 12/20/2021   CHOLHDL 4 07/30/2020   Lab Results  Component Value Date   VD25OH 29.9 (L) 12/20/2021   VD25OH 37.6 08/02/2021   VD25OH 34.4 02/18/2021   Lab Results  Component Value Date   WBC 11.0 (H) 07/30/2020   HGB 13.7 07/30/2020   HCT 41.4 07/30/2020   MCV 83.3 07/30/2020   PLT 256.0 07/30/2020   No results found for: "IRON", "TIBC", "FERRITIN"  Attestation Statements:   Reviewed by clinician on day of visit: allergies, medications, problem list, medical history, surgical history, family history, social history, and previous encounter notes.   Trude Mcburney, am acting as Energy manager for [employer], DO.  I have reviewed the above documentation for accuracy and completeness, and I agree with the above. Corinna Capra, DO

## 2021-12-22 ENCOUNTER — Encounter (INDEPENDENT_AMBULATORY_CARE_PROVIDER_SITE_OTHER): Payer: Self-pay | Admitting: Bariatrics

## 2022-01-03 ENCOUNTER — Ambulatory Visit (INDEPENDENT_AMBULATORY_CARE_PROVIDER_SITE_OTHER): Payer: 59 | Admitting: Adult Health

## 2022-01-19 ENCOUNTER — Encounter (INDEPENDENT_AMBULATORY_CARE_PROVIDER_SITE_OTHER): Payer: Self-pay | Admitting: Bariatrics

## 2022-01-19 ENCOUNTER — Ambulatory Visit (INDEPENDENT_AMBULATORY_CARE_PROVIDER_SITE_OTHER): Payer: 59 | Admitting: Bariatrics

## 2022-01-19 VITALS — BP 108/72 | HR 64 | Temp 98.4°F | Ht 74.0 in | Wt 278.0 lb

## 2022-01-19 DIAGNOSIS — E559 Vitamin D deficiency, unspecified: Secondary | ICD-10-CM | POA: Diagnosis not present

## 2022-01-19 DIAGNOSIS — R7303 Prediabetes: Secondary | ICD-10-CM

## 2022-01-19 DIAGNOSIS — Z6835 Body mass index (BMI) 35.0-35.9, adult: Secondary | ICD-10-CM

## 2022-01-19 DIAGNOSIS — Z7984 Long term (current) use of oral hypoglycemic drugs: Secondary | ICD-10-CM

## 2022-01-19 DIAGNOSIS — E669 Obesity, unspecified: Secondary | ICD-10-CM | POA: Diagnosis not present

## 2022-01-19 DIAGNOSIS — E66813 Obesity, class 3: Secondary | ICD-10-CM

## 2022-01-19 DIAGNOSIS — I10 Essential (primary) hypertension: Secondary | ICD-10-CM | POA: Diagnosis not present

## 2022-01-19 MED ORDER — VITAMIN D (ERGOCALCIFEROL) 1.25 MG (50000 UNIT) PO CAPS: 50000.0000 [IU] | ORAL_CAPSULE | ORAL | 0 refills | Status: DC

## 2022-01-19 MED ORDER — METFORMIN HCL 500 MG PO TABS: 500.0000 mg | ORAL_TABLET | Freq: Two times a day (BID) | ORAL | 0 refills | Status: DC

## 2022-01-19 MED ORDER — LOSARTAN POTASSIUM-HCTZ 100-12.5 MG PO TABS: 1.0000 | ORAL_TABLET | Freq: Every day | ORAL | 0 refills | Status: DC

## 2022-01-31 ENCOUNTER — Encounter (INDEPENDENT_AMBULATORY_CARE_PROVIDER_SITE_OTHER): Payer: Self-pay | Admitting: Bariatrics

## 2022-01-31 NOTE — Progress Notes (Signed)
Chief Complaint:   OBESITY Troy Peterson is here to discuss his progress with his obesity treatment plan along with follow-up of his obesity related diagnoses. Troy Peterson is on the Category 3 Plan and states he is following his eating plan approximately 98% of the time. Troy Peterson states he is walking for 30 minutes 5 times per week.  Today's visit was #: 18 Starting weight: 353 lbs Starting date: 11/07/2020 Today's weight: 278 lbs Today's date: 01/19/2022 Total lbs lost to date: 75 Total lbs lost since last in-office visit: 3  Interim History: Troy Peterson is down another 3 pounds since his last visit.  Subjective:   1. Vitamin D deficiency Troy Peterson's last Vitamin D level was 29.9.  2. Essential hypertension [name] notes occasional headaches.   3. Pre-diabetes Troy Peterson is taking metformin.  Assessment/Plan:   1. Vitamin D deficiency Troy Peterson will continue prescription vitamin D 50,000 units once weekly, and we will refill for 90 days.  - Vitamin D, Ergocalciferol, (DRISDOL) 1.25 MG (50000 UNIT) CAPS capsule; Take 1 capsule (50,000 Units total) by mouth every 7 (seven) days.  Dispense: 12 capsule; Refill: 0  2. Essential hypertension Troy Peterson will continue Hyzaar 100-12.5 mg daily, he is to decrease to 1/2 tablet daily.  He will get a pill container for his medication.  - losartan-hydrochlorothiazide (HYZAAR) 100-12.5 MG tablet; Take 1 tablet by mouth daily.  Dispense: 30 tablet; Refill: 0  3. Pre-diabetes Troy Peterson will continue metformin 500 mg twice daily with meals, and we will refill for 1 month.  - metFORMIN (GLUCOPHAGE) 500 MG tablet; Take 1 tablet (500 mg total) by mouth 2 (two) times daily with a meal.  Dispense: 60 tablet; Refill: 0  4. Obesity, current BMI of 35.7 Troy Peterson is currently in the action stage of change. As such, his goal is to continue with weight loss efforts. He has agreed to the Category 3 Plan.   Meal planning was discussed.  Reviewed labs with the patient from  12/20/2021, CMP, cholesterol, vitamin D, A1c, and insulin.  Exercise goals: As is.   Behavioral modification strategies: increasing lean protein intake, decreasing simple carbohydrates, increasing vegetables, increasing water intake, decreasing eating out, no skipping meals, meal planning and cooking strategies, keeping healthy foods in the home, and planning for success.  Troy Peterson has agreed to follow-up with our clinic in 4 weeks. He was informed of the importance of frequent follow-up visits to maximize his success with intensive lifestyle modifications for his multiple health conditions.   Objective:   Blood pressure 108/72, pulse 64, temperature 98.4 F (36.9 C), height 6\' 2"  (1.88 m), weight 278 lb (126.1 kg), SpO2 99 %. Body mass index is 35.69 kg/m.  General: Cooperative, alert, well developed, in no acute distress. HEENT: Conjunctivae and lids unremarkable. Cardiovascular: Regular rhythm.  Lungs: Normal work of breathing. Neurologic: No focal deficits.   Lab Results  Component Value Date   CREATININE 0.73 (L) 12/20/2021   BUN 14 12/20/2021   NA 144 12/20/2021   K 4.3 12/20/2021   CL 103 12/20/2021   CO2 24 12/20/2021   Lab Results  Component Value Date   ALT 22 12/20/2021   AST 22 12/20/2021   ALKPHOS 81 12/20/2021   BILITOT 0.6 12/20/2021   Lab Results  Component Value Date   HGBA1C 5.6 12/20/2021   HGBA1C 5.8 (H) 08/02/2021   HGBA1C 5.8 (H) 02/18/2021   HGBA1C 6.0 (H) 10/28/2020   Lab Results  Component Value Date   INSULIN 7.2 12/20/2021   INSULIN  7.0 08/02/2021   INSULIN 8.2 02/18/2021   INSULIN 17.7 10/28/2020   Lab Results  Component Value Date   TSH 1.15 07/30/2020   Lab Results  Component Value Date   CHOL 131 12/20/2021   HDL 48 12/20/2021   LDLCALC 68 12/20/2021   LDLDIRECT 102.0 07/25/2016   TRIG 78 12/20/2021   CHOLHDL 4 07/30/2020   Lab Results  Component Value Date   VD25OH 29.9 (L) 12/20/2021   VD25OH 37.6 08/02/2021   VD25OH  34.4 02/18/2021   Lab Results  Component Value Date   WBC 11.0 (H) 07/30/2020   HGB 13.7 07/30/2020   HCT 41.4 07/30/2020   MCV 83.3 07/30/2020   PLT 256.0 07/30/2020   No results found for: "IRON", "TIBC", "FERRITIN"  Attestation Statements:   Reviewed by clinician on day of visit: allergies, medications, problem list, medical history, surgical history, family history, social history, and previous encounter notes.  Trude Mcburney, am acting as Energy manager for [employer], DO.  I have reviewed the above documentation for accuracy and completeness, and I agree with the above. Corinna Capra, DO

## 2022-02-02 ENCOUNTER — Encounter (INDEPENDENT_AMBULATORY_CARE_PROVIDER_SITE_OTHER): Payer: Self-pay

## 2022-02-15 ENCOUNTER — Other Ambulatory Visit (INDEPENDENT_AMBULATORY_CARE_PROVIDER_SITE_OTHER): Payer: Self-pay | Admitting: Bariatrics

## 2022-02-15 DIAGNOSIS — R7303 Prediabetes: Secondary | ICD-10-CM

## 2022-02-15 DIAGNOSIS — I10 Essential (primary) hypertension: Secondary | ICD-10-CM

## 2022-02-21 ENCOUNTER — Ambulatory Visit (INDEPENDENT_AMBULATORY_CARE_PROVIDER_SITE_OTHER): Payer: 59 | Admitting: Bariatrics

## 2022-02-21 ENCOUNTER — Encounter (INDEPENDENT_AMBULATORY_CARE_PROVIDER_SITE_OTHER): Payer: Self-pay | Admitting: Bariatrics

## 2022-02-21 VITALS — BP 107/71 | HR 57 | Temp 97.8°F | Ht 74.0 in | Wt 289.0 lb

## 2022-02-21 DIAGNOSIS — R632 Polyphagia: Secondary | ICD-10-CM | POA: Diagnosis not present

## 2022-02-21 DIAGNOSIS — E559 Vitamin D deficiency, unspecified: Secondary | ICD-10-CM | POA: Diagnosis not present

## 2022-02-21 DIAGNOSIS — Z6837 Body mass index (BMI) 37.0-37.9, adult: Secondary | ICD-10-CM

## 2022-02-21 DIAGNOSIS — R7303 Prediabetes: Secondary | ICD-10-CM | POA: Diagnosis not present

## 2022-02-21 DIAGNOSIS — I10 Essential (primary) hypertension: Secondary | ICD-10-CM | POA: Diagnosis not present

## 2022-02-21 DIAGNOSIS — E669 Obesity, unspecified: Secondary | ICD-10-CM

## 2022-02-21 MED ORDER — LOSARTAN POTASSIUM-HCTZ 100-12.5 MG PO TABS: 1.0000 | ORAL_TABLET | Freq: Every day | ORAL | 0 refills | Status: DC

## 2022-03-01 NOTE — Progress Notes (Unsigned)
Chief Complaint:   OBESITY Troy Peterson is here to discuss his progress with his obesity treatment plan along with follow-up of his obesity related diagnoses. Troy Peterson is on the Category 3 Plan and states he is following his eating plan approximately 98% of the time. [name] states he is walking and running 30 minutes 5 times per week.  Today's visit was #: 19 Starting weight: 353 lbs Starting date: 11/07/2020 Today's weight: 289 lbs Today's date: 02/21/2022 Total lbs lost to date: 64 lbs Total lbs lost since last in-office visit: 0  Interim History: Troy Peterson is up 11 lbs since his last visit. He is up about 15 lbs of body water and his muscle mass is up.   Subjective:   1. Pre-diabetes Troy Peterson is taking Metformin.  2. Vitamin D deficiency Troy Peterson is taking Vitamin D.  3. Essential hypertension Troy Peterson's blood pressure is controlled.  4. Polyphagia Troy Peterson notes increased appetite.   Assessment/Plan:   1. Pre-diabetes Troy Peterson will continue Metformin and will keep carbohydrates low.  2. Vitamin D deficiency Troy Peterson will continue Vitamin D as directed.  3. Essential hypertension We will continue Hyzaar 100-12.5 mg daily for one month.  - losartan-hydrochlorothiazide (HYZAAR) 100-12.5 MG tablet; Take 1 tablet by mouth daily.  Dispense: 30 tablet; Refill: 0  4. Polyphagia Troy Peterson will increase his protein and water intake, and decrease his salt intake.  5. Obesity, current BMI of 37.2 [name] is currently in the action stage of change. As such, his goal is to continue with weight loss efforts. He has agreed to the Category 3 Plan.   Troy Peterson will adhere to the plan 80-100 %. Mindful eating was discussed. He will cut back on carbohydrates, decrease salt, and will weigh his meat.  Exercise goals: As is.  Behavioral modification strategies: increasing lean protein intake, decreasing simple carbohydrates, increasing vegetables, increasing water intake, decreasing eating out, no  skipping meals, meal planning and cooking strategies, keeping healthy foods in the home, and planning for success.  Troy Peterson has agreed to follow-up with our clinic in 4 weeks with Dr. Cathey Endow. He was informed of the importance of frequent follow-up visits to maximize his success with intensive lifestyle modifications for his multiple health conditions.   Objective:   Blood pressure 107/71, pulse (!) 57, temperature 97.8 F (36.6 C), height 6\' 2"  (1.88 m), weight 289 lb (131.1 kg), SpO2 98 %. Body mass index is 37.11 kg/m.  General: Cooperative, alert, well developed, in no acute distress. HEENT: Conjunctivae and lids unremarkable. Cardiovascular: Regular rhythm.  Lungs: Normal work of breathing. Neurologic: No focal deficits.   Lab Results  Component Value Date   CREATININE 0.73 (L) 12/20/2021   BUN 14 12/20/2021   NA 144 12/20/2021   K 4.3 12/20/2021   CL 103 12/20/2021   CO2 24 12/20/2021   Lab Results  Component Value Date   ALT 22 12/20/2021   AST 22 12/20/2021   ALKPHOS 81 12/20/2021   BILITOT 0.6 12/20/2021   Lab Results  Component Value Date   HGBA1C 5.6 12/20/2021   HGBA1C 5.8 (H) 08/02/2021   HGBA1C 5.8 (H) 02/18/2021   HGBA1C 6.0 (H) 10/28/2020   Lab Results  Component Value Date   INSULIN 7.2 12/20/2021   INSULIN 7.0 08/02/2021   INSULIN 8.2 02/18/2021   INSULIN 17.7 10/28/2020   Lab Results  Component Value Date   TSH 1.15 07/30/2020   Lab Results  Component Value Date   CHOL 131 12/20/2021   HDL 48 12/20/2021  LDLCALC 68 12/20/2021   LDLDIRECT 102.0 07/25/2016   TRIG 78 12/20/2021   CHOLHDL 4 07/30/2020   Lab Results  Component Value Date   VD25OH 29.9 (L) 12/20/2021   VD25OH 37.6 08/02/2021   VD25OH 34.4 02/18/2021   Lab Results  Component Value Date   WBC 11.0 (H) 07/30/2020   HGB 13.7 07/30/2020   HCT 41.4 07/30/2020   MCV 83.3 07/30/2020   PLT 256.0 07/30/2020   No results found for: "IRON", "TIBC", "FERRITIN"  Attestation  Statements:   Reviewed by clinician on day of visit: allergies, medications, problem list, medical history, surgical history, family history, social history, and previous encounter notes.  Micheline Rough, am acting as Energy manager for [employer], DO.  I have reviewed the above documentation for accuracy and completeness, and I agree with the above. Corinna Capra, DO

## 2022-03-02 ENCOUNTER — Encounter (INDEPENDENT_AMBULATORY_CARE_PROVIDER_SITE_OTHER): Payer: Self-pay | Admitting: Bariatrics

## 2022-03-22 ENCOUNTER — Ambulatory Visit (INDEPENDENT_AMBULATORY_CARE_PROVIDER_SITE_OTHER): Payer: 59 | Admitting: Family Medicine

## 2022-03-22 VITALS — BP 124/78 | HR 52 | Temp 97.5°F | Ht 74.0 in | Wt 283.0 lb

## 2022-03-22 DIAGNOSIS — Z6836 Body mass index (BMI) 36.0-36.9, adult: Secondary | ICD-10-CM

## 2022-03-22 DIAGNOSIS — E559 Vitamin D deficiency, unspecified: Secondary | ICD-10-CM | POA: Diagnosis not present

## 2022-03-22 DIAGNOSIS — R7303 Prediabetes: Secondary | ICD-10-CM | POA: Diagnosis not present

## 2022-03-22 DIAGNOSIS — I10 Essential (primary) hypertension: Secondary | ICD-10-CM | POA: Diagnosis not present

## 2022-03-22 DIAGNOSIS — E669 Obesity, unspecified: Secondary | ICD-10-CM

## 2022-03-22 MED ORDER — LOSARTAN POTASSIUM-HCTZ 100-12.5 MG PO TABS: 1.0000 | ORAL_TABLET | Freq: Every day | ORAL | 0 refills | Status: DC

## 2022-03-23 ENCOUNTER — Encounter (INDEPENDENT_AMBULATORY_CARE_PROVIDER_SITE_OTHER): Payer: Self-pay | Admitting: Family Medicine

## 2022-03-24 NOTE — Progress Notes (Signed)
Chief Complaint:   OBESITY Troy Peterson is here to discuss his progress with his obesity treatment plan along with follow-up of his obesity related diagnoses. Troy Peterson is on the Category 3 Plan and states he is following his eating plan approximately 96% of the time. Troy Peterson states he is walking and running 30 minutes 5 times per week.  Today's visit was #: 20 Starting weight: 353 lbs Starting date: 11/07/2020 Today's weight: 283 lbs Today's date: 03/22/2022 Total lbs lost to date: 70 lbs Total lbs lost since last in-office visit: 6 lbs  Interim History: Eating 3 meals, but getting bored with some of the meals.  Has bagel thin with eggs, [place] sausage for breakfast and an apple  mid AM. [place] sandwich with low calorie bread, greek yogurt in the afternoon, meat, veggies at dinner.  Tired of chicken.  Drinks plenty  of water.    Subjective:   1. Essential hypertension Blood pressure is at goal.  She is on Hyzaar 100-12.5 mg daily. She denies side effects.  2. Vitamin D deficiency He is currently taking prescription vitamin D 50,000 IU each week. He denies nausea, vomiting or muscle weakness. Has refill.   3. Pre-diabetes Improving.  Reviewed normal A1c, fasting insulin from June.    Assessment/Plan:   1. Essential hypertension Refill - losartan-hydrochlorothiazide (HYZAAR) 100-12.5 MG tablet; Take 1 tablet by mouth daily.  Dispense: 30 tablet; Refill: 0  2. Vitamin D deficiency Continue prescription Vitamin D weekly.   3. Pre-diabetes Discontinue metformin given improved labs and 70 lb weight loss.   4. Obesity,current BMI 36.4 Change calorie goal based on last REE (3024).  Troy Peterson is currently in the action stage of change. As such, his goal is to continue with weight loss efforts. He has agreed to keeping a food journal and adhering to recommended goals of 2400  calories and 130 protein.   Exercise goals:  As is.   Behavioral modification strategies: increasing lean  protein intake, increasing vegetables, increasing water intake, decreasing liquid calories, decreasing eating out, no skipping meals, meal planning and cooking strategies, keeping healthy foods in the home, and keeping a strict food journal.  Troy Peterson has agreed to follow-up with our clinic in 3-4 weeks. He was informed of the importance of frequent follow-up visits to maximize his success with intensive lifestyle modifications for his multiple health conditions.   Objective:   Blood pressure 124/78, pulse (!) 52, temperature (!) 97.5 F (36.4 C), height 6\' 2"  (1.88 m), weight 283 lb (128.4 kg), SpO2 99 %. Body mass index is 36.34 kg/m.  General: Cooperative, alert, well developed, in no acute distress. HEENT: Conjunctivae and lids unremarkable. Cardiovascular: Regular rhythm.  Lungs: Normal work of breathing. Neurologic: No focal deficits.   Lab Results  Component Value Date   CREATININE 0.73 (L) 12/20/2021   BUN 14 12/20/2021   NA 144 12/20/2021   K 4.3 12/20/2021   CL 103 12/20/2021   CO2 24 12/20/2021   Lab Results  Component Value Date   ALT 22 12/20/2021   AST 22 12/20/2021   ALKPHOS 81 12/20/2021   BILITOT 0.6 12/20/2021   Lab Results  Component Value Date   HGBA1C 5.6 12/20/2021   HGBA1C 5.8 (H) 08/02/2021   HGBA1C 5.8 (H) 02/18/2021   HGBA1C 6.0 (H) 10/28/2020   Lab Results  Component Value Date   INSULIN 7.2 12/20/2021   INSULIN 7.0 08/02/2021   INSULIN 8.2 02/18/2021   INSULIN 17.7 10/28/2020   Lab Results  Component Value Date   TSH 1.15 07/30/2020   Lab Results  Component Value Date   CHOL 131 12/20/2021   HDL 48 12/20/2021   LDLCALC 68 12/20/2021   LDLDIRECT 102.0 07/25/2016   TRIG 78 12/20/2021   CHOLHDL 4 07/30/2020   Lab Results  Component Value Date   VD25OH 29.9 (L) 12/20/2021   VD25OH 37.6 08/02/2021   VD25OH 34.4 02/18/2021   Lab Results  Component Value Date   WBC 11.0 (H) 07/30/2020   HGB 13.7 07/30/2020   HCT 41.4 07/30/2020    MCV 83.3 07/30/2020   PLT 256.0 07/30/2020   No results found for: "IRON", "TIBC", "FERRITIN"   Attestation Statements:   Reviewed by clinician on day of visit: allergies, medications, problem list, medical history, surgical history, family history, social history, and previous encounter notes.  I, Davy Pique, am acting as Location manager for Loyal Gambler, DO.  I have reviewed the above documentation for accuracy and completeness, and I agree with the above. Dell Ponto, DO

## 2022-03-29 ENCOUNTER — Other Ambulatory Visit (INDEPENDENT_AMBULATORY_CARE_PROVIDER_SITE_OTHER): Payer: Self-pay | Admitting: Bariatrics

## 2022-03-29 DIAGNOSIS — I10 Essential (primary) hypertension: Secondary | ICD-10-CM

## 2022-04-20 ENCOUNTER — Ambulatory Visit (INDEPENDENT_AMBULATORY_CARE_PROVIDER_SITE_OTHER): Payer: 59 | Admitting: Family Medicine

## 2022-04-20 ENCOUNTER — Encounter (INDEPENDENT_AMBULATORY_CARE_PROVIDER_SITE_OTHER): Payer: Self-pay | Admitting: Family Medicine

## 2022-04-20 VITALS — BP 130/77 | HR 58 | Temp 98.0°F | Ht 74.0 in | Wt 282.0 lb

## 2022-04-20 DIAGNOSIS — E559 Vitamin D deficiency, unspecified: Secondary | ICD-10-CM

## 2022-04-20 DIAGNOSIS — E669 Obesity, unspecified: Secondary | ICD-10-CM | POA: Diagnosis not present

## 2022-04-20 DIAGNOSIS — I10 Essential (primary) hypertension: Secondary | ICD-10-CM | POA: Diagnosis not present

## 2022-04-20 DIAGNOSIS — Z6836 Body mass index (BMI) 36.0-36.9, adult: Secondary | ICD-10-CM | POA: Diagnosis not present

## 2022-04-20 MED ORDER — LOSARTAN POTASSIUM-HCTZ 100-12.5 MG PO TABS: 1.0000 | ORAL_TABLET | Freq: Every day | ORAL | 0 refills | Status: DC

## 2022-05-09 NOTE — Progress Notes (Signed)
Chief Complaint:   OBESITY Troy Peterson is here to discuss his progress with his obesity treatment plan along with follow-up of his obesity related diagnoses. Troy Peterson is on keeping a food journal and adhering to recommended goals of 2400 calories and 1300 protein and states he is following his eating plan approximately 98% of the time. Troy Peterson states he is walking 30 minutes 5 times per week.  Today's visit was #: 21 Starting weight: 353 lbs Starting date: 11/07/2020 Today's weight: 282 lbs Today's date: 04/20/2022 Total lbs lost to date: 71 lbs Total lbs lost since last in-office visit: 1 lb  Interim History: He is using [employer] to log daily intake.  He is getting 2100-2200 calories daily.  He rarely goes over, but did go to the beach and to a fish fry.  Getting 130 grams of protein daily.  He walks 30 minutes 5 times per week.  He will be travelling to [place] next week.   Subjective:   1. Essential hypertension Blood pressure is well controlled on Losartan/HCTZ without adverse side effects.   2. Vitamin D deficiency He is currently taking prescription vitamin D 50,000 IU each week. He denies nausea, vomiting or muscle weakness. Energy level improving.   Assessment/Plan:   1. Essential hypertension Refill - losartan-hydrochlorothiazide (HYZAAR) 100-12.5 MG tablet; Take 1 tablet by mouth daily.  Dispense: 30 tablet; Refill: 0  2. Vitamin D deficiency Recheck in December 2023.  Declined need for refill today.   3. Obesity, current BMI 36.3 1) Reviewed [employer] results, got off track on weekends.  2) Continue to log intake.  Do not skip breakfast on the weekends.   Troy Peterson is currently in the action stage of change. As such, his goal is to continue with weight loss efforts. He has agreed to keeping a food journal and adhering to recommended goals of 2200 calories and 130 protein daily.   Exercise goals:  As is.   Behavioral modification strategies: increasing lean  protein intake, increasing vegetables, increasing water intake, decreasing eating out, no skipping meals, meal planning and cooking strategies, avoiding temptations, and decreasing junk food.  Troy Peterson has agreed to follow-up with our clinic in 3-4 weeks. He was informed of the importance of frequent follow-up visits to maximize his success with intensive lifestyle modifications for his multiple health conditions.   Objective:   Blood pressure 130/77, pulse (!) 58, temperature 98 F (36.7 C), height 6\' 2"  (1.88 m), weight 282 lb (127.9 kg), SpO2 99 %. Body mass index is 36.21 kg/m.  General: Cooperative, alert, well developed, in no acute distress. HEENT: Conjunctivae and lids unremarkable. Cardiovascular: Regular rhythm.  Lungs: Normal work of breathing. Neurologic: No focal deficits.   Lab Results  Component Value Date   CREATININE 0.73 (L) 12/20/2021   BUN 14 12/20/2021   NA 144 12/20/2021   K 4.3 12/20/2021   CL 103 12/20/2021   CO2 24 12/20/2021   Lab Results  Component Value Date   ALT 22 12/20/2021   AST 22 12/20/2021   ALKPHOS 81 12/20/2021   BILITOT 0.6 12/20/2021   Lab Results  Component Value Date   HGBA1C 5.6 12/20/2021   HGBA1C 5.8 (H) 08/02/2021   HGBA1C 5.8 (H) 02/18/2021   HGBA1C 6.0 (H) 10/28/2020   Lab Results  Component Value Date   INSULIN 7.2 12/20/2021   INSULIN 7.0 08/02/2021   INSULIN 8.2 02/18/2021   INSULIN 17.7 10/28/2020   Lab Results  Component Value Date  TSH 1.15 07/30/2020   Lab Results  Component Value Date   CHOL 131 12/20/2021   HDL 48 12/20/2021   LDLCALC 68 12/20/2021   LDLDIRECT 102.0 07/25/2016   TRIG 78 12/20/2021   CHOLHDL 4 07/30/2020   Lab Results  Component Value Date   VD25OH 29.9 (L) 12/20/2021   VD25OH 37.6 08/02/2021   VD25OH 34.4 02/18/2021   Lab Results  Component Value Date   WBC 11.0 (H) 07/30/2020   HGB 13.7 07/30/2020   HCT 41.4 07/30/2020   MCV 83.3 07/30/2020   PLT 256.0 07/30/2020   No  results found for: "IRON", "TIBC", "FERRITIN"  Attestation Statements:   Reviewed by clinician on day of visit: allergies, medications, problem list, medical history, surgical history, family history, social history, and previous encounter notes.  Time spent on visit including pre-visit chart review and post-visit care and charting was 30 minutes.   I, Malcolm Metro, am acting as Energy manager for Seymour Bars, DO. . I have reviewed the above documentation for accuracy and completeness, and I agree with the above. Glennis Brink, DO

## 2022-05-24 ENCOUNTER — Ambulatory Visit (INDEPENDENT_AMBULATORY_CARE_PROVIDER_SITE_OTHER): Payer: 59 | Admitting: Family Medicine

## 2022-05-24 ENCOUNTER — Encounter (INDEPENDENT_AMBULATORY_CARE_PROVIDER_SITE_OTHER): Payer: Self-pay | Admitting: Family Medicine

## 2022-05-24 VITALS — BP 123/81 | HR 60 | Temp 97.7°F | Ht 74.0 in | Wt 284.0 lb

## 2022-05-24 DIAGNOSIS — E559 Vitamin D deficiency, unspecified: Secondary | ICD-10-CM

## 2022-05-24 DIAGNOSIS — E669 Obesity, unspecified: Secondary | ICD-10-CM

## 2022-05-24 DIAGNOSIS — I10 Essential (primary) hypertension: Secondary | ICD-10-CM

## 2022-05-24 DIAGNOSIS — Z6836 Body mass index (BMI) 36.0-36.9, adult: Secondary | ICD-10-CM | POA: Diagnosis not present

## 2022-05-24 DIAGNOSIS — Z6841 Body Mass Index (BMI) 40.0 and over, adult: Secondary | ICD-10-CM

## 2022-05-24 MED ORDER — LOSARTAN POTASSIUM-HCTZ 100-12.5 MG PO TABS: 1.0000 | ORAL_TABLET | Freq: Every day | ORAL | 0 refills | Status: DC

## 2022-06-02 NOTE — Progress Notes (Addendum)
Chief Complaint:   OBESITY Troy Peterson is here to discuss his progress with his obesity treatment plan along with follow-up of his obesity related diagnoses. Troy Peterson is on keeping a food journal and adhering to recommended goals of 2200 calories and 1320 protein and states he is following his eating plan approximately 97% of the time. Troy Peterson states he is walking 30 minutes 5 times per week.  Today's visit was #: 22 Starting weight: 353 LBS Starting date: 11/07/2020 Today's weight: 284 LBS Today's date: 05/24/2022 Total lbs lost to date: 69 LBS Total lbs lost since last in-office visit: +2 LBS  Interim History: Patient is logging daily intake, but still getting under recommended calorie range.  He is hitting daily protein goal.  He is hungrier at nighttime.  He bought a weight bench.  He has been consistent with walking 30 min most days of the week.  Subjective:   1. Vitamin D deficiency He is currently taking prescription vitamin D 50,000 IU each week. He denies nausea, vomiting or muscle weakness.  He is not taking weekly.  2. Essential hypertension His blood pressure is well-controlled on losartan/HCTZ 100/12.5 mg daily.  He denies any adverse side effects.  Assessment/Plan:   1. Vitamin D deficiency Reminded patient to take prescription vitamin D 50,000 IU weekly.  2. Essential hypertension Refill- losartan-hydrochlorothiazide (HYZAAR) 100-12.5 MG tablet; Take 1 tablet by mouth daily.  Dispense: 90 tablet; Refill: 0  3. Obesity, current BMI 36.5 REE:  3024. 1) Allow variability 1500 to 2200 calorie including 130 g of protein every day based on hunger cues.  2) He gained 3 pounds of muscle.  Troy Peterson is currently in the action stage of change. As such, his goal is to continue with weight loss efforts. He has agreed to keeping a food journal and adhering to recommended goals of 2200 calories and 130 protein.   Exercise goals:  As is.  Behavioral modification strategies:  increasing lean protein intake, increasing vegetables, increasing water intake, no skipping meals, keeping healthy foods in the home, better snacking choices, planning for success, and keeping a strict food journal.  Troy Peterson has agreed to follow-up with our clinic in 4 weeks. He was informed of the importance of frequent follow-up visits to maximize his success with intensive lifestyle modifications for his multiple health conditions.   Objective:   Blood pressure 123/81, pulse 60, temperature 97.7 F (36.5 C), height 6\' 2"  (1.88 m), weight 284 lb (128.8 kg), SpO2 98 %. Body mass index is 36.46 kg/m.  General: Cooperative, alert, well developed, in no acute distress. HEENT: Conjunctivae and lids unremarkable. Cardiovascular: Regular rhythm.  Lungs: Normal work of breathing. Neurologic: No focal deficits.   Lab Results  Component Value Date   CREATININE 0.73 (L) 12/20/2021   BUN 14 12/20/2021   NA 144 12/20/2021   K 4.3 12/20/2021   CL 103 12/20/2021   CO2 24 12/20/2021   Lab Results  Component Value Date   ALT 22 12/20/2021   AST 22 12/20/2021   ALKPHOS 81 12/20/2021   BILITOT 0.6 12/20/2021   Lab Results  Component Value Date   HGBA1C 5.6 12/20/2021   HGBA1C 5.8 (H) 08/02/2021   HGBA1C 5.8 (H) 02/18/2021   HGBA1C 6.0 (H) 10/28/2020   Lab Results  Component Value Date   INSULIN 7.2 12/20/2021   INSULIN 7.0 08/02/2021   INSULIN 8.2 02/18/2021   INSULIN 17.7 10/28/2020   Lab Results  Component Value Date   TSH 1.15 07/30/2020  Lab Results  Component Value Date   CHOL 131 12/20/2021   HDL 48 12/20/2021   LDLCALC 68 12/20/2021   LDLDIRECT 102.0 07/25/2016   TRIG 78 12/20/2021   CHOLHDL 4 07/30/2020   Lab Results  Component Value Date   VD25OH 29.9 (L) 12/20/2021   VD25OH 37.6 08/02/2021   VD25OH 34.4 02/18/2021   Lab Results  Component Value Date   WBC 11.0 (H) 07/30/2020   HGB 13.7 07/30/2020   HCT 41.4 07/30/2020   MCV 83.3 07/30/2020   PLT 256.0  07/30/2020   No results found for: "IRON", "TIBC", "FERRITIN"  Attestation Statements:   Reviewed by clinician on day of visit: allergies, medications, problem list, medical history, surgical history, family history, social history, and previous encounter notes.  I, Malcolm Metro, am acting as Energy manager for Seymour Bars, DO.  I have reviewed the above documentation for accuracy and completeness, and I agree with the above. Glennis Brink, DO

## 2022-06-29 ENCOUNTER — Ambulatory Visit (INDEPENDENT_AMBULATORY_CARE_PROVIDER_SITE_OTHER): Payer: 59 | Admitting: Family Medicine

## 2022-06-29 ENCOUNTER — Encounter (INDEPENDENT_AMBULATORY_CARE_PROVIDER_SITE_OTHER): Payer: Self-pay | Admitting: Family Medicine

## 2022-06-29 VITALS — BP 133/86 | HR 58 | Temp 97.7°F | Ht 74.0 in | Wt 294.0 lb

## 2022-06-29 DIAGNOSIS — R7303 Prediabetes: Secondary | ICD-10-CM

## 2022-06-29 DIAGNOSIS — E559 Vitamin D deficiency, unspecified: Secondary | ICD-10-CM

## 2022-06-29 DIAGNOSIS — E669 Obesity, unspecified: Secondary | ICD-10-CM

## 2022-06-29 DIAGNOSIS — Z6837 Body mass index (BMI) 37.0-37.9, adult: Secondary | ICD-10-CM

## 2022-06-29 DIAGNOSIS — I10 Essential (primary) hypertension: Secondary | ICD-10-CM

## 2022-06-29 DIAGNOSIS — E88819 Insulin resistance, unspecified: Secondary | ICD-10-CM | POA: Insufficient documentation

## 2022-07-04 NOTE — Progress Notes (Signed)
Chief Complaint:   OBESITY Troy Peterson is here to discuss his progress with his obesity treatment plan along with follow-up of his obesity related diagnoses. Troy Peterson is on the Category 3 Plan and states he is following his eating plan approximately 80% of the time. Troy Peterson states he is walking 30 minutes 5 times per week.  Today's visit was #: 78 Starting weight: 353 LBS Starting date: 11/07/2020 Today's weight: 294 LBS Today's date: 06/29/2022 Total lbs lost to date: 57 LBS Total lbs lost since last in-office visit: +10 LBS  Interim History: Patient has a [employer] at home.  Started using and sporadically.  Struggling to hydrate well with water.  Staying off SSB's.  Walks 30 minutes 5 times a week.  Got off track with foods over the holiday.  Restart logging.  Craves sweets.  1. Pre-diabetes Patient restarted metformin 500 mg daily.  Felt more cravings and bloating off metformin.  2. Insulin resistance Fasting insulin was as high as 17.7 in 2022.  Patient complains of increased carb and sugar cravings off metformin.  He consistently walks 150 minutes/week.  3. Vitamin D deficiency Patient is on prescription vitamin D 50,000 IU weekly.  Did not take for 3 to 4 weeks.  Complains of fatigue.  4. Essential hypertension Patient is on Hyzaar 100/12.5 mg daily.  His blood pressure is at goal.  He denies adverse side effects.  Assessment/Plan:   1. Pre-diabetes Restart metformin 500 mg daily with food, patient already has prescription.  Check A1c, BMP, B12 at next visit.  2. Insulin resistance Restarting metformin 500 mg daily.  Reduce intake of sugar less than 30 g/day.  3. Vitamin D deficiency Restart prescription vitamin D 50,000 IU weekly.  Patient has prescription.  Recheck vitamin D level in next visit with testosterone level due to fatigue.  4. Essential hypertension Continue to work on BMI reduction for hypertension management.  Continue Hyzaar 100/12.5 mg daily.  5.  Obesity,current BMI 37.8 1.  Resume my diary for calorie logging. 2.  Weight training at home 3 days/week. 3.  Consider using Qsymia.  Troy Peterson is currently in the action stage of change. As such, his goal is to continue with weight loss efforts. He has agreed to keeping a food journal and adhering to recommended goals of 1500-2000 calories and 130 protein daily.   Exercise goals: All adults should avoid inactivity. Some physical activity is better than none, and adults who participate in any amount of physical activity gain some health benefits.  Behavioral modification strategies: increasing lean protein intake, increasing vegetables, increasing water intake, decreasing eating out, no skipping meals, meal planning and cooking strategies, keeping healthy foods in the home, and planning for success.  Troy Peterson has agreed to follow-up with our clinic in 4 weeks. He was informed of the importance of frequent follow-up visits to maximize his success with intensive lifestyle modifications for his multiple health conditions.   Objective:   Blood pressure 133/86, pulse (!) 58, temperature 97.7 F (36.5 C), height 6\' 2"  (1.88 m), weight 294 lb (133.4 kg), SpO2 98 %. Body mass index is 37.75 kg/m.  General: Cooperative, alert, well developed, in no acute distress. HEENT: Conjunctivae and lids unremarkable. Cardiovascular: Regular rhythm.  Lungs: Normal work of breathing. Neurologic: No focal deficits.   Lab Results  Component Value Date   CREATININE 0.73 (L) 12/20/2021   BUN 14 12/20/2021   NA 144 12/20/2021   K 4.3 12/20/2021   CL 103 12/20/2021  CO2 24 12/20/2021   Lab Results  Component Value Date   ALT 22 12/20/2021   AST 22 12/20/2021   ALKPHOS 81 12/20/2021   BILITOT 0.6 12/20/2021   Lab Results  Component Value Date   HGBA1C 5.6 12/20/2021   HGBA1C 5.8 (H) 08/02/2021   HGBA1C 5.8 (H) 02/18/2021   HGBA1C 6.0 (H) 10/28/2020   Lab Results  Component Value Date   INSULIN 7.2  12/20/2021   INSULIN 7.0 08/02/2021   INSULIN 8.2 02/18/2021   INSULIN 17.7 10/28/2020   Lab Results  Component Value Date   TSH 1.15 07/30/2020   Lab Results  Component Value Date   CHOL 131 12/20/2021   HDL 48 12/20/2021   LDLCALC 68 12/20/2021   LDLDIRECT 102.0 07/25/2016   TRIG 78 12/20/2021   CHOLHDL 4 07/30/2020   Lab Results  Component Value Date   VD25OH 29.9 (L) 12/20/2021   VD25OH 37.6 08/02/2021   VD25OH 34.4 02/18/2021   Lab Results  Component Value Date   WBC 11.0 (H) 07/30/2020   HGB 13.7 07/30/2020   HCT 41.4 07/30/2020   MCV 83.3 07/30/2020   PLT 256.0 07/30/2020   No results found for: "IRON", "TIBC", "FERRITIN"  Attestation Statements:   Reviewed by clinician on day of visit: allergies, medications, problem list, medical history, surgical history, family history, social history, and previous encounter notes.  I, Malcolm Metro, am acting as Energy manager for Seymour Bars, DO.  I have reviewed the above documentation for accuracy and completeness, and I agree with the above. Glennis Brink, DO

## 2022-07-27 ENCOUNTER — Encounter (INDEPENDENT_AMBULATORY_CARE_PROVIDER_SITE_OTHER): Payer: Self-pay | Admitting: Physician Assistant

## 2022-07-27 ENCOUNTER — Ambulatory Visit (INDEPENDENT_AMBULATORY_CARE_PROVIDER_SITE_OTHER): Payer: 59 | Admitting: Physician Assistant

## 2022-07-27 VITALS — BP 124/84 | HR 62 | Temp 98.0°F | Ht 74.0 in | Wt 282.0 lb

## 2022-07-27 DIAGNOSIS — I10 Essential (primary) hypertension: Secondary | ICD-10-CM | POA: Diagnosis not present

## 2022-07-27 DIAGNOSIS — E559 Vitamin D deficiency, unspecified: Secondary | ICD-10-CM

## 2022-07-27 DIAGNOSIS — E669 Obesity, unspecified: Secondary | ICD-10-CM

## 2022-07-27 DIAGNOSIS — R7303 Prediabetes: Secondary | ICD-10-CM

## 2022-07-27 DIAGNOSIS — Z6836 Body mass index (BMI) 36.0-36.9, adult: Secondary | ICD-10-CM

## 2022-07-27 DIAGNOSIS — K5909 Other constipation: Secondary | ICD-10-CM

## 2022-07-27 MED ORDER — POLYETHYLENE GLYCOL 3350 17 GM/SCOOP PO POWD: 17.0000 g | Freq: Every day | ORAL | 0 refills | Status: DC

## 2022-07-27 MED ORDER — VITAMIN D (ERGOCALCIFEROL) 1.25 MG (50000 UNIT) PO CAPS: 50000.0000 [IU] | ORAL_CAPSULE | ORAL | 0 refills | Status: DC

## 2022-07-27 MED ORDER — METFORMIN HCL 500 MG PO TABS: 500.0000 mg | ORAL_TABLET | Freq: Every day | ORAL | 0 refills | Status: DC

## 2022-07-27 MED ORDER — LOSARTAN POTASSIUM 100 MG PO TABS: 100.0000 mg | ORAL_TABLET | Freq: Every day | ORAL | 0 refills | Status: DC

## 2022-08-03 NOTE — Progress Notes (Signed)
Chief Complaint:   OBESITY Troy Peterson is here to discuss his progress with his obesity treatment plan along with follow-up of his obesity related diagnoses. Troy Peterson is on keeping a food journal and adhering to recommended goals of 1500-2000 calories and 130 grams of protein and states he is following his eating plan approximately 97% of the time. Troy Peterson states he is walking/running 30 minutes 5 times per week.  Today's visit was #: 24 Starting weight: 353 lbs Starting date: 11/07/2020 Today's weight: 282 lbs Today's date: 07/27/2022 Total lbs lost to date: 71 lbs Total lbs lost since last in-office visit: 12  Interim History: Troy Peterson has done very well with weight loss over the past 3 weeks. He is doing well overall with journaling.  Daily calories are 1500-2200 and Protein is 130-200 grams daily.    He started eating some grapes as he is having some constipation issues and we discussed better fruit choices, increasing fiber and making sure he is well hydrated.   Subjective:   1. Pre-diabetes [place] restarted metformin 500 mg daily-no side effects.  He would like to continue on metformin.  A1c at 5.6, insulin was 7.2 on 12/20/2021.  2. Other constipation Ovel reports fiber tabs did not help, but eating grapes worked well.  Discussed smart fruit choices and to increase fiber and water for better hydration.   3. Essential hypertension Blood pressure at goal-drinking 8 bottles of water daily.  Reports rare lightheadedness and wonders if he needs HCTZ anymore.He is taking Hyzaar 100-12.5 mg daily.   4. Vitamin D deficiency Vitamin D level of 29.9 on 12/20/2021-not at goal.Taking Ergocalciferol Once weekly-no side effects of nausea/vomiting or muscle weakness.  Assessment/Plan:   1. Pre-diabetes Continue/refill metformin 500 mg once daily for 1 month with 0 refills.  Continue Prescribed Nutrition Plan and exercise to promote weight loss and improve glycemic control and prevent  progression to Type 2 diabetes.   -Refill metFORMIN (GLUCOPHAGE) 500 MG tablet; Take 1 tablet (500 mg total) by mouth daily.  Dispense: 30 tablet; Refill: 0  2. Other constipation Increase fiber and water. Add MiraLAX once daily.  May take twice a day or every other day based on results. Add Metamucil or Citrucel daily.   Will refill MiraLAX 17 g by mouth daily for 1 month with 0 refills.  -Refill polyethylene glycol powder (GLYCOLAX/MIRALAX) 17 GM/SCOOP powder; Take 17 g by mouth daily for 30 doses.  Dispense: 578 g; Refill: 0  3. Essential hypertension Will discontinue the HCTZ from his medication and monitor closely .  Change to Losartan 100 mg daily for 1 month with 0 refills.  Monitor and log blood pressure at home and will bring in log for review at next visit.  -Refill losartan (COZAAR) 100 MG tablet; Take 1 tablet (100 mg total) by mouth daily.  Dispense: 30 tablet; Refill: 0  4. Vitamin D deficiency We will refill ergocalciferol once a week for 1 month with 0 refills.  -Refill Vitamin D, Ergocalciferol, (DRISDOL) 1.25 MG (50000 UNIT) CAPS capsule; Take 1 capsule (50,000 Units total) by mouth every 7 (seven) days.  Dispense: 12 capsule; Refill: 0  5. Obesity,current BMI 36.2 Troy Peterson is currently in the action stage of change. As such, his goal is to continue with weight loss efforts. He has agreed to the Category 3 Plan and keeping a food journal and adhering to recommended goals of 1500-2200 calories and 130+ grams of protein daily.   Exercise goals: As is.  Behavioral modification  strategies: increasing lean protein intake, decreasing simple carbohydrates, meal planning and cooking strategies, and keeping a strict food journal.  Troy Peterson has agreed to follow-up with our clinic in 3 weeks. He was informed of the importance of frequent follow-up visits to maximize his success with intensive lifestyle modifications for his multiple health conditions.   Objective:   Blood  pressure 124/84, pulse 62, temperature 98 F (36.7 C), height 6' 2"$  (1.88 m), weight 282 lb (127.9 kg), SpO2 99 %. Body mass index is 36.21 kg/m.  General: Cooperative, alert, well developed, in no acute distress. HEENT: Conjunctivae and lids unremarkable. Cardiovascular: Regular rhythm.  Lungs: Normal work of breathing. Neurologic: No focal deficits.   Lab Results  Component Value Date   CREATININE 0.73 (L) 12/20/2021   BUN 14 12/20/2021   NA 144 12/20/2021   K 4.3 12/20/2021   CL 103 12/20/2021   CO2 24 12/20/2021   Lab Results  Component Value Date   ALT 22 12/20/2021   AST 22 12/20/2021   ALKPHOS 81 12/20/2021   BILITOT 0.6 12/20/2021   Lab Results  Component Value Date   HGBA1C 5.6 12/20/2021   HGBA1C 5.8 (H) 08/02/2021   HGBA1C 5.8 (H) 02/18/2021   HGBA1C 6.0 (H) 10/28/2020   Lab Results  Component Value Date   INSULIN 7.2 12/20/2021   INSULIN 7.0 08/02/2021   INSULIN 8.2 02/18/2021   INSULIN 17.7 10/28/2020   Lab Results  Component Value Date   TSH 1.15 07/30/2020   Lab Results  Component Value Date   CHOL 131 12/20/2021   HDL 48 12/20/2021   LDLCALC 68 12/20/2021   LDLDIRECT 102.0 07/25/2016   TRIG 78 12/20/2021   CHOLHDL 4 07/30/2020   Lab Results  Component Value Date   VD25OH 29.9 (L) 12/20/2021   VD25OH 37.6 08/02/2021   VD25OH 34.4 02/18/2021   Lab Results  Component Value Date   WBC 11.0 (H) 07/30/2020   HGB 13.7 07/30/2020   HCT 41.4 07/30/2020   MCV 83.3 07/30/2020   PLT 256.0 07/30/2020   No results found for: "IRON", "TIBC", "FERRITIN"  Attestation Statements:   Reviewed by clinician on day of visit: allergies, medications, problem list, medical history, surgical history, family history, social history, and previous encounter notes.  I, Brendell Tyus, am acting as transcriptionist for [employer], PA.  I have reviewed the above documentation for accuracy and completeness, and I agree with the above. -   Kalai Baca,PA-C

## 2022-08-17 ENCOUNTER — Encounter (INDEPENDENT_AMBULATORY_CARE_PROVIDER_SITE_OTHER): Payer: Self-pay | Admitting: Family Medicine

## 2022-08-17 ENCOUNTER — Ambulatory Visit (INDEPENDENT_AMBULATORY_CARE_PROVIDER_SITE_OTHER): Payer: 59 | Admitting: Family Medicine

## 2022-08-17 VITALS — BP 121/79 | HR 56 | Temp 98.0°F | Ht 74.0 in | Wt 284.6 lb

## 2022-08-17 DIAGNOSIS — R7303 Prediabetes: Secondary | ICD-10-CM | POA: Diagnosis not present

## 2022-08-17 DIAGNOSIS — Z6836 Body mass index (BMI) 36.0-36.9, adult: Secondary | ICD-10-CM

## 2022-08-17 DIAGNOSIS — E559 Vitamin D deficiency, unspecified: Secondary | ICD-10-CM

## 2022-08-17 DIAGNOSIS — I10 Essential (primary) hypertension: Secondary | ICD-10-CM

## 2022-08-17 MED ORDER — LOSARTAN POTASSIUM 100 MG PO TABS: 100.0000 mg | ORAL_TABLET | Freq: Every day | ORAL | 0 refills | Status: DC

## 2022-08-31 NOTE — Progress Notes (Signed)
Chief Complaint:   OBESITY Troy Peterson is here to discuss his progress with his obesity treatment plan along with follow-up of his obesity related diagnoses. Troy Peterson is on the Category 3 Plan and keeping a food journal and adhering to recommended goals of 1500-2000 calories and 130+ protein and states he is following his eating plan approximately 97% of the time. Troy Peterson states he is walking, running 30-45 minutes 5 times per week.  Today's visit was #: 25 Starting weight: 353 LBS Starting date: 11/07/2020 Today's weight: 284 LBS Today's date: 08/17/2022 Total lbs lost to date: 11 LBS Total lbs lost since last in-office visit: +2 LBS  Interim History: First appointment with me.  Prior seen by Dr. Owens Shark, Dr. Valetta Close, also Troy Peterson and Troy Peterson.  Patient normally comes once monthly.  Journals regularly and hit 1600-2100 cal/day and 130+ grams of protein occasionally.  He struggles with going over.  Endorses overall he has been stagnant with his weight loss for several months.  Ranges from 294-278 LBS, but cannot breakthrough.  Subjective:   1. Essential hypertension Blood pressures at home 120s/80s.  Patient denies dizziness or lightheadedness or other concerns.  Does not see PCP yearly.  I encouraged for him to do so for his physicals.  2. Vitamin D deficiency Patient is on 50,000 IU weekly.  Last labs done was 12/20/2021 and not at goal, at 29.9.  3. Pre-diabetes A1c initially 6.0, about 6 months ago it was 5.6.  No concerns today, denies cravings or hunger.  Patient is taking metformin 1 tablet daily.  Assessment/Plan:  No orders of the defined types were placed in this encounter.   Medications Discontinued During This Encounter  Medication Reason   losartan (COZAAR) 100 MG tablet Reorder     Meds ordered this encounter  Medications   losartan (COZAAR) 100 MG tablet    Sig: Take 1 tablet (100 mg total) by mouth daily.    Dispense:  30 tablet    Refill:  0     1. Essential  hypertension Continue PNP and low-salt foods.  Continue exercise and weight loss.  Refill- losartan (COZAAR) 100 MG tablet; Take 1 tablet (100 mg total) by mouth daily.  Dispense: 30 tablet; Refill: 0  2. Vitamin D deficiency No need for refill at this time.  At next office visit patient needs recheck of vitamin D, vitamin D not at goal when last checked.  3. Pre-diabetes Continue metformin only for change in dose or refill at this time.  Continue PNP, weight loss.  Needs fasting blood work at next office visit.  4. BMI 36.0-36.9,adult-current bmi 36.5  5. Morbid obesity (HCC)-start bmi 47.25 Due to initial REE at 20-35 after 80 LBS weight loss and 4/23, repeat IC 3024 surprisingly.  Prior was on 1600 cal/day and lost continuously will change meal plan.  Troy Peterson is currently in the action stage of change. As such, his goal is to continue with weight loss efforts. He has agreed to keeping a food journal and adhering to recommended goals of 1500-1700 calories and 120 protein daily.   Exercise goals:  As is.  Needs to add strength training.  Behavioral modification strategies: increasing lean protein intake, decreasing simple carbohydrates, and keeping a strict food journal.  Troy Peterson has agreed to follow-up with our clinic in 4 weeks. He was informed of the importance of frequent follow-up visits to maximize his success with intensive lifestyle modifications for his multiple health conditions.   Objective:   Blood pressure  121/79, pulse (!) 56, temperature 98 F (36.7 C), height '6\' 2"'$  (1.88 m), weight 284 lb 9.6 oz (129.1 kg), SpO2 97 %. Body mass index is 36.54 kg/m.  General: Cooperative, alert, well developed, in no acute distress. HEENT: Conjunctivae and lids unremarkable. Cardiovascular: Regular rhythm.  Lungs: Normal work of breathing. Neurologic: No focal deficits.   Lab Results  Component Value Date   CREATININE 0.73 (L) 12/20/2021   BUN 14 12/20/2021   NA 144 12/20/2021    K 4.3 12/20/2021   CL 103 12/20/2021   CO2 24 12/20/2021   Lab Results  Component Value Date   ALT 22 12/20/2021   AST 22 12/20/2021   ALKPHOS 81 12/20/2021   BILITOT 0.6 12/20/2021   Lab Results  Component Value Date   HGBA1C 5.6 12/20/2021   HGBA1C 5.8 (H) 08/02/2021   HGBA1C 5.8 (H) 02/18/2021   HGBA1C 6.0 (H) 10/28/2020   Lab Results  Component Value Date   INSULIN 7.2 12/20/2021   INSULIN 7.0 08/02/2021   INSULIN 8.2 02/18/2021   INSULIN 17.7 10/28/2020   Lab Results  Component Value Date   TSH 1.15 07/30/2020   Lab Results  Component Value Date   CHOL 131 12/20/2021   HDL 48 12/20/2021   LDLCALC 68 12/20/2021   LDLDIRECT 102.0 07/25/2016   TRIG 78 12/20/2021   CHOLHDL 4 07/30/2020   Lab Results  Component Value Date   VD25OH 29.9 (L) 12/20/2021   VD25OH 37.6 08/02/2021   VD25OH 34.4 02/18/2021   Lab Results  Component Value Date   WBC 11.0 (H) 07/30/2020   HGB 13.7 07/30/2020   HCT 41.4 07/30/2020   MCV 83.3 07/30/2020   PLT 256.0 07/30/2020   No results found for: "IRON", "TIBC", "FERRITIN"  Attestation Statements:   Reviewed by clinician on day of visit: allergies, medications, problem list, medical history, surgical history, family history, social history, and previous encounter notes.  Time spent on visit including pre-visit chart review and post-visit care and charting was 40 minutes.   I, Davy Pique, RMA, am acting as Location manager for [employer], DO.  I have reviewed the above documentation for accuracy and completeness, and I agree with the above. Troy Peterson, D.O.  The [place] was signed into law in 2016 which includes the topic of electronic health records.  This provides immediate access to information in MyChart.  This includes consultation notes, operative notes, office notes, lab results and pathology reports.  If you have any questions about what you read please let us know at your next visit so we  can discuss your concerns and take corrective action if need be.  We are right here with you.

## 2022-09-14 ENCOUNTER — Ambulatory Visit (INDEPENDENT_AMBULATORY_CARE_PROVIDER_SITE_OTHER): Payer: 59 | Admitting: Family Medicine

## 2022-09-14 ENCOUNTER — Encounter (INDEPENDENT_AMBULATORY_CARE_PROVIDER_SITE_OTHER): Payer: Self-pay | Admitting: Family Medicine

## 2022-09-14 VITALS — BP 110/77 | HR 66 | Temp 97.9°F | Ht 74.0 in | Wt 279.0 lb

## 2022-09-14 DIAGNOSIS — I10 Essential (primary) hypertension: Secondary | ICD-10-CM | POA: Diagnosis not present

## 2022-09-14 DIAGNOSIS — E559 Vitamin D deficiency, unspecified: Secondary | ICD-10-CM | POA: Diagnosis not present

## 2022-09-14 DIAGNOSIS — R7303 Prediabetes: Secondary | ICD-10-CM

## 2022-09-14 DIAGNOSIS — R5383 Other fatigue: Secondary | ICD-10-CM | POA: Diagnosis not present

## 2022-09-14 DIAGNOSIS — Z6835 Body mass index (BMI) 35.0-35.9, adult: Secondary | ICD-10-CM

## 2022-09-14 DIAGNOSIS — Z6836 Body mass index (BMI) 36.0-36.9, adult: Secondary | ICD-10-CM

## 2022-09-14 DIAGNOSIS — K5909 Other constipation: Secondary | ICD-10-CM

## 2022-09-14 MED ORDER — VITAMIN D (ERGOCALCIFEROL) 1.25 MG (50000 UNIT) PO CAPS: 50000.0000 [IU] | ORAL_CAPSULE | ORAL | 0 refills | Status: DC

## 2022-09-14 MED ORDER — POLYETHYLENE GLYCOL 3350 17 GM/SCOOP PO POWD: 17.0000 g | Freq: Every day | ORAL | 0 refills | Status: DC

## 2022-09-14 MED ORDER — LOSARTAN POTASSIUM-HCTZ 100-12.5 MG PO TABS: 1.0000 | ORAL_TABLET | Freq: Every day | ORAL | 0 refills | Status: DC

## 2022-09-14 MED ORDER — METFORMIN HCL 500 MG PO TABS: 500.0000 mg | ORAL_TABLET | Freq: Two times a day (BID) | ORAL | 0 refills | Status: DC

## 2022-09-14 NOTE — Progress Notes (Signed)
Troy Peterson, D.O.  ABFM, ABOM Specializing in Clinical Bariatric Medicine  Office located at: [address], [place]  [address]     Assessment and Plan:   Orders Placed This Encounter  Procedures   Comprehensive metabolic panel   Hemoglobin A1c   Insulin, random   Lipid Panel With LDL/HDL Ratio   VITAMIN D 25 Hydroxy (Vit-D Deficiency, Fractures)   Vitamin B12   Magnesium   T4, free   TSH   Testosterone Free, Profile I    Medications Discontinued During This Encounter  Medication Reason   Vitamin D, Ergocalciferol, (DRISDOL) 1.25 MG (50000 UNIT) CAPS capsule Reorder   polyethylene glycol powder (GLYCOLAX/MIRALAX) 17 GM/SCOOP powder Reorder   metFORMIN (GLUCOPHAGE) 500 MG tablet Reorder     Meds ordered this encounter  Medications   metFORMIN (GLUCOPHAGE) 500 MG tablet    Sig: Take 1 tablet (500 mg total) by mouth 2 (two) times daily with a meal.    Dispense:  60 tablet    Refill:  0   Vitamin D, Ergocalciferol, (DRISDOL) 1.25 MG (50000 UNIT) CAPS capsule    Sig: Take 1 capsule (50,000 Units total) by mouth every 7 (seven) days.    Dispense:  12 capsule    Refill:  0   polyethylene glycol powder (GLYCOLAX/MIRALAX) 17 GM/SCOOP powder    Sig: Take 17 g by mouth daily for 34 doses.    Dispense:  578 g    Refill:  0   losartan-hydrochlorothiazide (HYZAAR) 100-12.5 MG tablet    Sig: Take 1 tablet by mouth daily.    Dispense:  30 tablet    Refill:  0     BMI 36.0-36.9,adult-current bmi 36.5 Morbid obesity (HCC)-start bmi 47.25 Assessment: Condition is Improving, but not optimized.. Biometric data collected today, was reviewed with patient.  Fat mass has decreased by 6.4lb. Muscle mass has increased by 0.8lb. Total body water has increased by 1.2lb.   Plan: Continue with keeping a food journal and adhering to recommended goals of 1500-1700 calories and 120 protein. Education regarding management of these chronic disease states was given.  Management strategies discussed on successive visits include dietary and exercise recommendations, goals of achieving and maintaining BMI < 30, exercise and minimizing stressors.     Essential hypertension Assessment: Condition is Controlled.  Last 3 blood pressure readings in our office are as follows: BP Readings from Last 3 Encounters:  09/14/22 110/77  08/17/22 121/79  07/27/22 124/84  On 07/27/22, he stopped taking the HCTZ part of the Losartan after reporting lightheadedness at his office visit with my colleague. Since then, he has been checking his bp regularly at home and it was running high per patient in the upper 130s/ upper 80s. Due to these blood pressure, he opted to restart  the HCTZ part of the Losartan. He is now asymptomatic when taking Losartan+HCTZ and reports at home better control of his bp. He is asymptomatic.   Plan: Continue and refill Losartan+HCTZ today.   Lifestyle changes such as following our low salt, heart healthy meal plan and engaging in a regular exercise program discussed  - We will continue to monitor closely alongside PCP/ specialists.  Pt reminded to also f/up with those individuals as instructed by them.  - We will continue to monitor symptoms as they relate to the his weight loss journey.  - Patient will try to increase exercise.    Vitamin D deficiency  Assessment: Condition is Not optimized.. Labs were  reviewed.  Lab Results  Component Value Date   VD25OH 29.9 (L) 12/20/2021   VD25OH 37.6 08/02/2021   VD25OH 34.4 02/18/2021  He has been compliant with Ergocalciferol 50K IU weekly. Denies any side effects.   Plan: Continue and refill Ergocalciferol 50K IU weekly. Recheck Vitamin D today - weight loss will likely improve availability of vitamin D, thus encouraged Troy Peterson to continue with meal plan and their weight loss efforts to further improve this condition.  Thus, we will need to monitor levels regularly (every 3-4 mo on average) to keep levels  within normal limits and prevent over supplementation.   Pre-diabetes Assessment: Condition is Improving, but not optimized..  Lab Results  Component Value Date   HGBA1C 5.6 12/20/2021   HGBA1C 5.8 (H) 08/02/2021   HGBA1C 5.8 (H) 02/18/2021   INSULIN 7.2 12/20/2021   INSULIN 7.0 08/02/2021   INSULIN 8.2 02/18/2021  He reports good compliance and tolerance with Metformin 500 mg daily. He reports increased cravings after dinner, especially with the Easter candy around his house.   Plan: Increased Metformin to BID to help with cravings. Recheck Magnesium and B12 today - Continue to decrease simple carbs/ sugars; increase fiber and proteins -> follow his meal plan.   - Explained role of simple carbs and insulin levels on hunger and cravings - Troy Peterson will continue to work on weight loss, exercise, via their meal plan we devised to help decrease the risk of progressing to diabetes.   Tiredness Assessment: Condition is Not optimized..  Patient reports feeling tired and wanting to take naps in the afternoon. Some days the tiredness is worse than others. He sleeps well and uses CPAP every night.  Plan: Recheck Thyroid and Testosterone today. Continue his prudent nutritional plan and continue to advance exercise and cardiovascular fitness as tolerated. I advised patient to consider repeat visit with sleep med doc if it has been approximately 5 years since last visit to ensure settings are appropriate.    Constipation Assessment: Condition is Controlled. Patient denies any constipation when using Miralax daily. He drinks 10 bottles every day. No concerns but he requested refill for Miralax.   Plan: Will refill Miralax today. Continue treatment of constipation with Miralax daily.     TREATMENT PLAN FOR OBESITY:  Recommended Dietary Goals Troy Peterson is currently in the action stage of change. As such, his goal is to continue weight management plan. He has agreed to continue keeping a food  journal and adhering to recommended goals of 1500-1700 calories and 120 protein.  Behavioral Intervention Additional resources provided today:  Evidence-based interventions for health behavior change were utilized today including the discussion of self monitoring techniques, problem-solving barriers and SMART goal setting techniques.   Regarding patient's less desirable eating habits and patterns, we employed the technique of small changes.  Pt will specifically work on: paying attention to excess calorie consumption from snacking for next visit.    Recommended Physical Activity Goals Troy Peterson has been advised to work up to 150 minutes of moderate intensity aerobic activity a week and strengthening exercises 2-3 times per week for cardiovascular health, weight loss maintenance and preservation of muscle mass.  He has agreed to Continue current level of physical activity   FOLLOW UP: Return in about 4 weeks (around 10/12/2022). He was informed of the importance of frequent follow up visits to maximize his success with intensive lifestyle modifications for his multiple health conditions. Troy Peterson is aware that we will review all of his lab results  at our next visit.  He is aware that if anything is critical/ life threatening with the results, we will be contacting him via [place] prior to the office visit to discuss management.   Subjective:   Chief complaint: Obesity Troy Peterson is here to discuss his progress with his obesity treatment plan. He is on the keeping a food journal and adhering to recommended goals of 1500-1700 calories and 120 protein and states he is following his eating plan approximately 97% of the time. He states he is not exercising.   Interval History:  Since last office visit he has been hitting his calorie and protein goals on the weekdays but struggles on the weekends. At nights, he has been noticing an increase in sweet cravings and as a result he is snacking.   He did not  journal prior to this visit. He is eating a lot of protein in breakfast, lunch, and dinner. On the weekends, he eats high caloric foods like has tacos, casserole, chicken alfredo. He takes Miralax every day for constipation.    Review of Systems:  Pertinent positives were addressed with patient today.  Weight Summary and Biometrics   Weight Lost Since Last Visit: 5lb  No data recorded   Vitals Temp: 97.9 F (36.6 C) BP: 110/77 Pulse Rate: 66 SpO2: 99 %   Anthropometric Measurements Height: 6\' 2"  (1.88 m) Weight: 279 lb (126.6 kg) BMI (Calculated): 35.81 Weight at Last Visit: 284lb Weight Lost Since Last Visit: 5lb Starting Weight: 353lb Total Weight Loss (lbs): 74 lb (33.6 kg)   Body Composition  Body Fat %: 32.2 % Fat Mass (lbs): 89.8 lbs Muscle Mass (lbs): 180 lbs Total Body Water (lbs): 135.6 lbs Visceral Fat Rating : 16   Other Clinical Data Fasting: yes Labs: Yes Today's Visit #: 26 Starting Date: 11/07/20    Objective:   PHYSICAL EXAM:  Blood pressure 110/77, pulse 66, temperature 97.9 F (36.6 C), height 6\' 2"  (1.88 m), weight 279 lb (126.6 kg), SpO2 99 %. Body mass index is 35.82 kg/m.  General: Well Developed, well nourished, and in no acute distress.  HEENT: Normocephalic, atraumatic Skin: Warm and dry, cap RF less 2 sec, good turgor Chest:  Normal excursion, shape, no gross abn Respiratory: speaking in full sentences, no conversational dyspnea NeuroM-Sk: Ambulates w/o assistance, moves * 4 Psych: A and O *3, insight good, mood-full  DIAGNOSTIC DATA REVIEWED:  BMET    Component Value Date/Time   NA 144 12/20/2021 1600   K 4.3 12/20/2021 1600   CL 103 12/20/2021 1600   CO2 24 12/20/2021 1600   GLUCOSE 80 12/20/2021 1600   GLUCOSE 107 (H) 07/30/2020 1700   BUN 14 12/20/2021 1600   CREATININE 0.73 (L) 12/20/2021 1600   CALCIUM 9.3 12/20/2021 1600   GFRNONAA >60 02/08/2008 0445   GFRAA  02/08/2008 0445    >60        The eGFR has  been calculated using the MDRD equation. This calculation has not been validated in all clinical   Lab Results  Component Value Date   HGBA1C 5.6 12/20/2021   HGBA1C 6.0 (H) 10/28/2020   Lab Results  Component Value Date   INSULIN 7.2 12/20/2021   INSULIN 17.7 10/28/2020   Lab Results  Component Value Date   TSH 1.15 07/30/2020   CBC    Component Value Date/Time   WBC 11.0 (H) 07/30/2020 1700   RBC 4.97 07/30/2020 1700   HGB 13.7 07/30/2020 1700   HCT 41.4 07/30/2020  1700   PLT 256.0 07/30/2020 1700   MCV 83.3 07/30/2020 1700   MCHC 33.1 07/30/2020 1700   RDW 13.9 07/30/2020 1700   Iron Studies No results found for: "IRON", "TIBC", "FERRITIN", "IRONPCTSAT" Lipid Panel     Component Value Date/Time   CHOL 131 12/20/2021 1600   TRIG 78 12/20/2021 1600   HDL 48 12/20/2021 1600   CHOLHDL 4 07/30/2020 1700   VLDL 16.6 07/30/2020 1700   LDLCALC 68 12/20/2021 1600   LDLDIRECT 102.0 07/25/2016 1600   Hepatic Function Panel     Component Value Date/Time   PROT 7.1 12/20/2021 1600   ALBUMIN 4.6 12/20/2021 1600   AST 22 12/20/2021 1600   ALT 22 12/20/2021 1600   ALKPHOS 81 12/20/2021 1600   BILITOT 0.6 12/20/2021 1600      Component Value Date/Time   TSH 1.15 07/30/2020 1700   Nutritional Lab Results  Component Value Date   VD25OH 29.9 (L) 12/20/2021   VD25OH 37.6 08/02/2021   VD25OH 34.4 02/18/2021    Attestations:   Reviewed by clinician on day of visit: allergies, medications, problem list, medical history, surgical history, family history, social history, and previous encounter notes.   Patient was in the office today and time spent on visit including pre-visit chart review and post-visit care/coordination of care and electronic medical record documentation was 40 minutes. 50% of the time was in face to face counseling of this patient's medical condition(s) and providing education on treatment options to include the first-line treatment of diet and  lifestyle modification.   I,Troy Peterson,acting as a Education administrator for [employer], DO.,have documented all relevant documentation on the behalf of Troy Dance, DO,as directed by  Troy Dance, DO while in the presence of Troy Dance, DO.   I, Troy Dance, DO, have reviewed all documentation for this visit. The documentation on 09/14/22 for the exam, diagnosis, procedures, and orders are all accurate and complete.

## 2022-09-15 LAB — LIPID PANEL WITH LDL/HDL RATIO
Cholesterol, Total: 151 mg/dL (ref 100–199)
HDL: 51 mg/dL (ref >39)
LDL Chol Calc (NIH): 85 mg/dL (ref 0–99)
LDL/HDL Ratio: 1.7 ratio (ref 0.0–3.6)
Triglycerides: 77 mg/dL (ref 0–149)
VLDL Cholesterol Cal: 15 mg/dL (ref 5–40)

## 2022-09-15 LAB — COMPREHENSIVE METABOLIC PANEL
ALT: 29 IU/L (ref 0–44)
AST: 21 IU/L (ref 0–40)
Albumin/Globulin Ratio: 1.5 (ref 1.2–2.2)
Albumin: 4.4 g/dL (ref 4.1–5.1)
Alkaline Phosphatase: 82 IU/L (ref 44–121)
BUN/Creatinine Ratio: 21 — ABNORMAL HIGH (ref 9–20)
BUN: 16 mg/dL (ref 6–24)
Bilirubin Total: 0.8 mg/dL (ref 0.0–1.2)
CO2: 28 mmol/L (ref 20–29)
Calcium: 9.7 mg/dL (ref 8.7–10.2)
Chloride: 98 mmol/L (ref 96–106)
Creatinine, Ser: 0.75 mg/dL — ABNORMAL LOW (ref 0.76–1.27)
Globulin, Total: 2.9 g/dL (ref 1.5–4.5)
Glucose: 79 mg/dL (ref 70–99)
Potassium: 4.1 mmol/L (ref 3.5–5.2)
Sodium: 141 mmol/L (ref 134–144)
Total Protein: 7.3 g/dL (ref 6.0–8.5)
eGFR: 115 mL/min/{1.73_m2} (ref >59)

## 2022-09-15 LAB — HEMOGLOBIN A1C
Est. average glucose Bld gHb Est-mCnc: 111 mg/dL
Hgb A1c MFr Bld: 5.5 % (ref 4.8–5.6)

## 2022-09-15 LAB — T4, FREE: Free T4: 1.36 ng/dL (ref 0.82–1.77)

## 2022-09-15 LAB — TESTOSTERONE FREE, PROFILE I
Sex Hormone Binding: 42.7 nmol/L (ref 16.5–55.9)
Testost., Free, Calc: 90.3 pg/mL (ref 30.3–183.2)
Testosterone: 510 ng/dL (ref 264–916)

## 2022-09-15 LAB — VITAMIN D 25 HYDROXY (VIT D DEFICIENCY, FRACTURES): Vit D, 25-Hydroxy: 48.8 ng/mL (ref 30.0–100.0)

## 2022-09-15 LAB — TSH: TSH: 1.1 u[IU]/mL (ref 0.450–4.500)

## 2022-09-15 LAB — VITAMIN B12: Vitamin B-12: 974 pg/mL (ref 232–1245)

## 2022-09-15 LAB — MAGNESIUM: Magnesium: 2.2 mg/dL (ref 1.6–2.3)

## 2022-09-15 LAB — INSULIN, RANDOM: INSULIN: 7.2 u[IU]/mL (ref 2.6–24.9)

## 2022-10-10 ENCOUNTER — Encounter (INDEPENDENT_AMBULATORY_CARE_PROVIDER_SITE_OTHER): Payer: Self-pay | Admitting: Family Medicine

## 2022-10-10 ENCOUNTER — Ambulatory Visit (INDEPENDENT_AMBULATORY_CARE_PROVIDER_SITE_OTHER): Payer: 59 | Admitting: Family Medicine

## 2022-10-10 VITALS — BP 112/74 | HR 61 | Temp 97.8°F | Ht 74.0 in | Wt 284.0 lb

## 2022-10-10 DIAGNOSIS — I10 Essential (primary) hypertension: Secondary | ICD-10-CM

## 2022-10-10 DIAGNOSIS — K5909 Other constipation: Secondary | ICD-10-CM

## 2022-10-10 DIAGNOSIS — Z6836 Body mass index (BMI) 36.0-36.9, adult: Secondary | ICD-10-CM

## 2022-10-10 DIAGNOSIS — R7303 Prediabetes: Secondary | ICD-10-CM

## 2022-10-10 DIAGNOSIS — E559 Vitamin D deficiency, unspecified: Secondary | ICD-10-CM

## 2022-10-10 DIAGNOSIS — E7800 Pure hypercholesterolemia, unspecified: Secondary | ICD-10-CM

## 2022-10-10 DIAGNOSIS — Z6835 Body mass index (BMI) 35.0-35.9, adult: Secondary | ICD-10-CM

## 2022-10-10 MED ORDER — VITAMIN D (ERGOCALCIFEROL) 1.25 MG (50000 UNIT) PO CAPS: 50000.0000 [IU] | ORAL_CAPSULE | ORAL | 0 refills | Status: DC

## 2022-10-10 MED ORDER — POLYETHYLENE GLYCOL 3350 17 GM/SCOOP PO POWD: 17.0000 g | Freq: Every day | ORAL | 0 refills | Status: DC

## 2022-10-10 MED ORDER — LOSARTAN POTASSIUM-HCTZ 100-12.5 MG PO TABS: 1.0000 | ORAL_TABLET | Freq: Every day | ORAL | 0 refills | Status: DC

## 2022-10-10 MED ORDER — METFORMIN HCL 500 MG PO TABS: 500.0000 mg | ORAL_TABLET | Freq: Two times a day (BID) | ORAL | 0 refills | Status: DC

## 2022-10-10 NOTE — Progress Notes (Signed)
Troy Peterson, D.O.  ABFM, ABOM Specializing in Clinical Bariatric Medicine  Office located at: [address] [place], [place]  [address]     Assessment and Plan:   No orders of the defined types were placed in this encounter.   Medications Discontinued During This Encounter  Medication Reason   losartan (COZAAR) 100 MG tablet    metFORMIN (GLUCOPHAGE) 500 MG tablet Reorder   Vitamin D, Ergocalciferol, (DRISDOL) 1.25 MG (50000 UNIT) CAPS capsule Reorder   polyethylene glycol powder (GLYCOLAX/MIRALAX) 17 GM/SCOOP powder Reorder   losartan-hydrochlorothiazide (HYZAAR) 100-12.5 MG tablet Reorder     Meds ordered this encounter  Medications   polyethylene glycol powder (GLYCOLAX/MIRALAX) 17 GM/SCOOP powder    Sig: Take 17 g by mouth daily for 34 doses.    Dispense:  578 g    Refill:  0   metFORMIN (GLUCOPHAGE) 500 MG tablet    Sig: Take 1 tablet (500 mg total) by mouth 2 (two) times daily with a meal.    Dispense:  60 tablet    Refill:  0   losartan-hydrochlorothiazide (HYZAAR) 100-12.5 MG tablet    Sig: Take 1 tablet by mouth daily.    Dispense:  30 tablet    Refill:  0   Vitamin D, Ergocalciferol, (DRISDOL) 1.25 MG (50000 UNIT) CAPS capsule    Sig: Take 1 capsule (50,000 Units total) by mouth every 7 (seven) days.    Dispense:  4 capsule    Refill:  0    Essential hypertension Assessment: Condition is stable. Labs were reviewed. Last 3 blood pressure readings in our office are as follows: BP Readings from Last 3 Encounters:  10/10/22 112/74  09/14/22 110/77  08/17/22 121/79   Lab Results  Component Value Date   CREATININE 0.75 (L) 09/14/2022   BUN 16 09/14/2022   NA 141 09/14/2022   K 4.1 09/14/2022   CL 98 09/14/2022   CO2 28 09/14/2022      Component Value Date/Time   PROT 7.3 09/14/2022 1659   ALBUMIN 4.4 09/14/2022 1659   AST 21 09/14/2022 1659   ALT 29 09/14/2022 1659   ALKPHOS 82 09/14/2022 1659   BILITOT 0.8 09/14/2022 1659   Lab  Results  Component Value Date   TESTOSTERONE 510 09/14/2022  His BUN values may likely indicate that he is not drinking enough water. He endorses drinking 10 bottles of water daily. He reports good compliance with Cozaar 100 mg daily and Hyzaar 100-12.5 mg daily. Denies any side effects. No concerns. Asymptomatic.   Plan: Continue with meds. Will refill this today.   - Unless pre-existing renal or cardiopulmonary conditions exist which patient was told to limit their fluid intake by another provider, I recommended roughly one half of their weight in pounds, to be the approximate ounces of non-caloric, non-caffeinated beverages they should drink per day; including more if they are engaging in exercise.  - Avoid buying foods that are: processed, frozen, or prepackaged to avoid excess salt. - Ambulatory blood pressure monitoring encouraged.  Reminded patient that if they ever feel poorly in any way, to check their blood pressure and pulse as well. - We will continue to monitor closely alongside PCP/ specialists.  Pt reminded to also f/up with those individuals as instructed by them.  - We will continue to monitor symptoms as they relate to the his weight loss journey.    Elevated cholesterol Assessment: Condition is Improving, but not optimized. Labs were reviewed.  Lab Results  Component Value Date   CHOL 151 09/14/2022   HDL 51 09/14/2022   LDLCALC 85 09/14/2022   LDLDIRECT 102.0 07/25/2016   TRIG 77 09/14/2022   CHOLHDL 4 07/30/2020  Patients LDL is improving as it is under 100 and his good cholesterol is in the normal range. He is currently not taking any medication for this condition. This is diet controlled.   Plan:  - I stressed the importance that patient continue with our prudent nutritional plan that is low in saturated and trans fats, and low in fatty carbs to improve these numbers.  - We recommend: aerobic activity with eventual goal of a minimum of 150+ min wk plus 2 days/ week  of resistance or strength training.     Pre-diabetes Assessment: Condition is Improving, but not optimized. Labs were reviewed.  Lab Results  Component Value Date   HGBA1C 5.5 09/14/2022   HGBA1C 5.6 12/20/2021   HGBA1C 5.8 (H) 08/02/2021   INSULIN 7.2 09/14/2022   INSULIN 7.2 12/20/2021   INSULIN 7.0 08/02/2021   Lab Results  Component Value Date   TSH 1.100 09/14/2022   FREET4 1.36 09/14/2022   Lab Results  Component Value Date   VITAMINB12 974 09/14/2022  His magnesium levels were 2.2 on 09/14/22. His B12 levels are optimized as they are above the recommended value of 500+. His A1c levels have improved from 6.0 on 10/28/20 to 5.5 on 09/14/22. His fasting insulins will likely continue to go down as he loses weight.No issues with Metformin 500 mg daily, compliance good. His hunger and cravings are controlled when eating on the meal plan.   Plan: Continue with med. Will refill this today.   Continue his prudent nutritional plan that is low in simple carbohydrates, saturated fats and trans fats to goal of 5-10% weight loss to achieve significant health benefits.  Pt encouraged to continually advance exercise and cardiovascular fitness as tolerated throughout weight loss journey.    Vitamin D deficiency Assessment: Condition is Improving, but not optimized.. Labs were reviewed.  Lab Results  Component Value Date   VD25OH 48.8 09/14/2022   VD25OH 29.9 (L) 12/20/2021   VD25OH 37.6 08/02/2021  His Vitamin D levels are almost near the recommended value of 50+. He has been compliant with Ergocalciferol 50K IU weekly. No side effects.  Plan: Continue with Ergocalciferol 50K IU weekly. Will refill this today.   - weight loss will likely improve availability of vitamin D, thus encouraged Troy Peterson to continue with meal plan and their weight loss efforts to further improve this condition.  Thus, we will need to monitor levels regularly (every 3-4 mo on average) to keep levels within normal limits  and prevent over supplementation.   Other constipation Assessment: Condition is stable Patient's constipation symptoms are stable. He drinks 10 bottles of water daily. He is compliant with polyethylene glycol powder 17 g daily.   Plan: Will refill this today. Continue his prudent nutritional plan that is low in simple carbohydrates, saturated fats and trans fats to goal of 5-10% weight loss to achieve significant health benefits.  Pt encouraged to continually advance exercise and cardiovascular fitness as tolerated throughout weight loss journey.  I encouraged the patient about the importance of drinking approximately half of their body weight ( in pounds) in ounces of water daily.    TREATMENT PLAN FOR OBESITY: BMI 36.0-36.9,adult-current bmi 35.8 Morbid obesity (HCC)-start bmi 47.25 Assessment: Condition is not optimized. Biometric data collected today, was reviewed with patient.  Fat mass  has increased by 5.8lb. Muscle mass has decreased by .4lb. Total body water has increased by 4.4lb.   Plan:  Troy Peterson is currently in the action stage of change. As such, his goal is to continue weight management plan. 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  Behavioral Intervention Additional resources provided today:  strength training exercise handout Evidence-based interventions for health behavior change were utilized today including the discussion of self monitoring techniques, problem-solving barriers and SMART goal setting techniques.   Regarding patient's less desirable eating habits and patterns, we employed the technique of small changes.  Pt will specifically work on: doing strength training exercises 3 days a week, 3 sets of 10 for next visit.     Recommended Physical Activity Goals Troy Peterson has been advised to work up to 150 minutes of moderate intensity aerobic activity a week and strengthening exercises 2-3 times per week for cardiovascular health, weight loss maintenance and preservation of muscle mass.  He has agreed to Continue current level of physical activity   FOLLOW UP: Return in about 4 weeks (around 11/07/2022). He was informed of the importance of frequent follow up visits to maximize his success with intensive lifestyle modifications for his multiple health conditions.   Subjective:   Chief complaint: Obesity Troy Peterson is here to discuss his progress with his obesity treatment plan. He is on the keeping a food journal and adhering to recommended goals of 1500-1700 calories and 120+ protein and states he is following his eating plan approximately 97% of the time. He states he is walking 30 minutes 5 days per week.  Interval History:  Troy Peterson is here for a follow up office visit. Last weekend, he ate high caloric foods (i.e hot dogs, chicken nuggets) and alcohol because he was enjoying a NASCAR event. Prior to this, he was keeping a food journaling and adhering to his recommended caloric and protein goals.   Pharmacotherapy for weight loss: He is currently taking  Metformin  for medical weight loss.  Denies side effects.    Review of Systems:  Pertinent positives were addressed with patient today.   Weight Summary and Biometrics   Weight Lost Since Last Visit: 0  Weight Gained Since Last Visit: 5lb    Vitals Temp: 97.8 F (36.6 C) BP: 112/74 Pulse Rate: 61 SpO2: 98 %   Anthropometric Measurements Height:  (1.88 m) Weight: 284 lb (128.8 kg) BMI (Calculated): 36.45 Weight at Last Visit: 279lb Weight Lost Since Last Visit: 0 Weight Gained Since Last Visit: 5lb Starting Weight: 353lb Total Weight Loss (lbs): 69 lb (31.3 kg)   Body Composition  Body Fat %: 33.6 % Fat Mass (lbs): 95.6  lbs Muscle Mass (lbs): 179.6 lbs Total Body Water (lbs): 140 lbs Visceral Fat Rating : 17   Other Clinical Data Fasting: No Labs: No Today's Visit #: 27 Starting Date: 11/07/20     Objective:   PHYSICAL EXAM: Blood pressure 112/74, pulse 61, temperature 97.8 F (36.6 C), height  (1.88 m), weight 284 lb (128.8 kg), SpO2 98 %. Body mass index is 36.46 kg/m.  General: Well Developed, well nourished, and in no acute distress.  HEENT: Normocephalic, atraumatic Skin: Warm and dry, cap RF less 2 sec, good turgor Chest:  Normal excursion,  shape, no gross abn Respiratory: speaking in full sentences, no conversational dyspnea NeuroM-Sk: Ambulates w/o assistance, moves * 4 Psych: A and O *3, insight good, mood-full  DIAGNOSTIC DATA REVIEWED:  BMET    Component Value Date/Time   NA 141 09/14/2022 1659   K 4.1 09/14/2022 1659   CL 98 09/14/2022 1659   CO2 28 09/14/2022 1659   GLUCOSE 79 09/14/2022 1659   GLUCOSE 107 (H) 07/30/2020 1700   BUN 16 09/14/2022 1659   CREATININE 0.75 (L) 09/14/2022 1659   CALCIUM 9.7 09/14/2022 1659   GFRNONAA >60 02/08/2008 0445   GFRAA  02/08/2008 0445    >60        The eGFR has been calculated using the MDRD equation. This calculation has not been validated in all clinical   Lab Results  Component Value Date   HGBA1C 5.5 09/14/2022   HGBA1C 6.0 (H) 10/28/2020   Lab Results  Component Value Date   INSULIN 7.2 09/14/2022   INSULIN 17.7 10/28/2020   Lab Results  Component Value Date   TSH 1.100 09/14/2022   CBC    Component Value Date/Time   WBC 11.0 (H) 07/30/2020 1700   RBC 4.97 07/30/2020 1700   HGB 13.7 07/30/2020 1700   HCT 41.4 07/30/2020 1700   PLT 256.0 07/30/2020 1700   MCV 83.3 07/30/2020 1700   MCHC 33.1 07/30/2020 1700   RDW 13.9 07/30/2020 1700   Iron Studies No results found for: "IRON", "TIBC", "FERRITIN", "IRONPCTSAT" Lipid Panel     Component Value Date/Time   CHOL 151 09/14/2022 1659   TRIG 77  09/14/2022 1659   HDL 51 09/14/2022 1659   CHOLHDL 4 07/30/2020 1700   VLDL 16.6 07/30/2020 1700   LDLCALC 85 09/14/2022 1659   LDLDIRECT 102.0 07/25/2016 1600   Hepatic Function Panel     Component Value Date/Time   PROT 7.3 09/14/2022 1659   ALBUMIN 4.4 09/14/2022 1659   AST 21 09/14/2022 1659   ALT 29 09/14/2022 1659   ALKPHOS 82 09/14/2022 1659   BILITOT 0.8 09/14/2022 1659      Component Value Date/Time   TSH 1.100 09/14/2022 1659   Nutritional Lab Results  Component Value Date   VD25OH 48.8 09/14/2022   VD25OH 29.9 (L) 12/20/2021   VD25OH 37.6 08/02/2021    Attestations:   Reviewed by clinician on day of visit: allergies, medications, problem list, medical history, surgical history, family history, social history, and previous encounter notes.   I,Special Puri,acting as a Neurosurgeon for [employer], DO.,have documented all relevant documentation on the behalf of Thomasene Lot, DO,as directed by  Thomasene Lot, DO while in the presence of Thomasene Lot, DO.   I, Thomasene Lot, DO, have reviewed all documentation for this visit. The documentation on 10/10/22 for the exam, diagnosis, procedures, and orders are all accurate and complete.

## 2022-10-12 ENCOUNTER — Ambulatory Visit (INDEPENDENT_AMBULATORY_CARE_PROVIDER_SITE_OTHER): Payer: 59 | Admitting: Family Medicine

## 2022-11-03 ENCOUNTER — Ambulatory Visit (INDEPENDENT_AMBULATORY_CARE_PROVIDER_SITE_OTHER): Payer: 59 | Admitting: Family Medicine

## 2022-11-03 ENCOUNTER — Other Ambulatory Visit (INDEPENDENT_AMBULATORY_CARE_PROVIDER_SITE_OTHER): Payer: Self-pay | Admitting: Physician Assistant

## 2022-11-03 ENCOUNTER — Encounter (INDEPENDENT_AMBULATORY_CARE_PROVIDER_SITE_OTHER): Payer: Self-pay | Admitting: Family Medicine

## 2022-11-03 VITALS — BP 116/66 | HR 59 | Temp 98.1°F | Ht 74.0 in | Wt 279.0 lb

## 2022-11-03 DIAGNOSIS — E559 Vitamin D deficiency, unspecified: Secondary | ICD-10-CM | POA: Diagnosis not present

## 2022-11-03 DIAGNOSIS — I10 Essential (primary) hypertension: Secondary | ICD-10-CM | POA: Diagnosis not present

## 2022-11-03 DIAGNOSIS — K5909 Other constipation: Secondary | ICD-10-CM

## 2022-11-03 DIAGNOSIS — R7303 Prediabetes: Secondary | ICD-10-CM | POA: Diagnosis not present

## 2022-11-03 DIAGNOSIS — Z6836 Body mass index (BMI) 36.0-36.9, adult: Secondary | ICD-10-CM

## 2022-11-03 DIAGNOSIS — Z6835 Body mass index (BMI) 35.0-35.9, adult: Secondary | ICD-10-CM

## 2022-11-03 MED ORDER — LOSARTAN POTASSIUM-HCTZ 100-12.5 MG PO TABS: 1.0000 | ORAL_TABLET | Freq: Every day | ORAL | 0 refills | Status: DC

## 2022-11-03 MED ORDER — VITAMIN D (ERGOCALCIFEROL) 1.25 MG (50000 UNIT) PO CAPS
50000.0000 [IU] | ORAL_CAPSULE | ORAL | 0 refills | Status: DC
Start: 2022-11-03 — End: 2022-11-07

## 2022-11-03 MED ORDER — METFORMIN HCL 500 MG PO TABS: 500.0000 mg | ORAL_TABLET | Freq: Two times a day (BID) | ORAL | 0 refills | Status: DC

## 2022-11-03 MED ORDER — POLYETHYLENE GLYCOL 3350 17 GM/SCOOP PO POWD
17.0000 g | Freq: Every day | ORAL | 0 refills | Status: DC
Start: 2022-11-03 — End: 2022-12-08

## 2022-11-03 NOTE — Progress Notes (Signed)
Troy Peterson, D.O.  ABFM, ABOM Specializing in Clinical Bariatric Medicine  Office located at: [address] [place], [place]  [address]     Assessment and Plan:   Medications Discontinued During This Encounter  Medication Reason   polyethylene glycol powder (GLYCOLAX/MIRALAX) 17 GM/SCOOP powder Reorder   metFORMIN (GLUCOPHAGE) 500 MG tablet Reorder   losartan-hydrochlorothiazide (HYZAAR) 100-12.5 MG tablet Reorder   Vitamin D, Ergocalciferol, (DRISDOL) 1.25 MG (50000 UNIT) CAPS capsule Reorder     Meds ordered this encounter  Medications   losartan-hydrochlorothiazide (HYZAAR) 100-12.5 MG tablet    Sig: Take 1 tablet by mouth daily.    Dispense:  30 tablet    Refill:  0   metFORMIN (GLUCOPHAGE) 500 MG tablet    Sig: Take 1 tablet (500 mg total) by mouth 2 (two) times daily with a meal.    Dispense:  60 tablet    Refill:  0   DISCONTD: Vitamin D, Ergocalciferol, (DRISDOL) 1.25 MG (50000 UNIT) CAPS capsule    Sig: Take 1 capsule (50,000 Units total) by mouth every 7 (seven) days.    Dispense:  4 capsule    Refill:  0   polyethylene glycol powder (GLYCOLAX/MIRALAX) 17 GM/SCOOP powder    Sig: Take 17 g by mouth daily for 34 doses.    Dispense:  578 g    Refill:  0     Vitamin D deficiency Assessment: Condition is almost at goal. Lab Results  Component Value Date   VD25OH 48.8 09/14/2022   VD25OH 29.9 (L) 12/20/2021   VD25OH 37.6 08/02/2021  - No issues with Ergocalciferol 50K IU weekly. Denies any side effects.  Plan: - Continue with med. Will refill this today.   -Continue his prudent nutritional plan that is low in simple carbohydrates, saturated fats and trans fats to goal of 5-10% weight loss to achieve significant health benefits.  Pt encouraged to continually advance exercise and cardiovascular fitness as tolerated throughout weight loss journey.    Pre-diabetes Assessment: Condition is improving, but not optimized.  Lab Results  Component Value  Date   HGBA1C 5.5 09/14/2022   HGBA1C 5.6 12/20/2021   HGBA1C 5.8 (H) 08/02/2021   INSULIN 7.2 09/14/2022   INSULIN 7.2 12/20/2021   INSULIN 7.0 08/02/2021  - Good compliance and tolerance with Metformin 500 mg BID, denies any side effects. - His hunger and cravings are well controlled when following the plan.   Plan: - Continue with med. Will refill this today.    Troy Peterson will continue to work on weight loss, exercise, via their meal plan we devised to help decrease the risk of progressing to diabetes.    Essential hypertension Assessment: Condition is stable. Last 3 blood pressure readings in our office are as follows: BP Readings from Last 3 Encounters:  11/03/22 116/66  10/10/22 112/74  09/14/22 110/77  - Patient is compliant with Cozaar 100 mg daily and Hyzaar 100-12.5 mg daily.  - Endorses feeling lightheaded in the afternoons when he bends over or stands up. - Today he is asymptomatic, no concerns.  - Pt endorses he does not check his blood pressure at home.   Plan: - Continue with all meds. Will refill Hyzaar today.   - Daily ambulatory blood pressure monitoring encouraged.  Reminded patient that if they ever feel poorly in any way, to check their blood pressure and pulse as well. - If his symptoms of lightheadedness worsen, I advised patient to cut the Hyzaar medicine in  half and take only a half dose daily.  - Unless pre-existing renal or cardiopulmonary conditions exist which patient was told to limit their fluid intake by another provider, I recommended roughly one half of their weight in pounds, to be the approximate ounces of non-caloric, non-caffeinated beverages they should drink per day; including more if they are engaging in exercise.  - We will continue to monitor symptoms as they relate to the his weight loss journey.    Other constipation Assessment: Condition is stable. - Patients constipation has been under good control recently.  - He endorses drinking  9-10 bottles of water daily.  - No issues with polyethylene glycol powder 17 g daily.   Plan: - Continue with polyethylene glycol powder. Will refill this today.   - Adequate daily water intake encouraged along with activity/ movement  - Eat plenty of fiber (goal is over 25 grams each day).  It is best to get most of your fiber from dietary sources which includes leafy green vegetables, fresh fruit, and whole grains.     TREATMENT PLAN FOR OBESITY: BMI 36.0-36.9,adult-current bmi 35.81 Morbid obesity (HCC)-start bmi 47.25 Assessment:  Troy Peterson is here to discuss his progress with his obesity treatment plan along with follow-up of his obesity related diagnoses. See Medical Weight Management Flowsheet for complete bioelectrical impedance results.  Condition is improving, but not optimized. Biometric data collected today, was reviewed with patient.   Since last office visit on 10/10/22 patient's Fat mass has decreased by 1.2 lb. Muscle mass has decreased by 4 lb. Total body water has decreased by 2.2 lb.  Counseling done on how various foods will affect these numbers and how to maximize success  Weight change since last visit: -5   Total lbs lost to date: 74 lbs Total weight loss percentage to date: 20.96 ( use https://www.[employer].si )   Plan:  Troy Peterson is currently in the action stage of change. As such, his goal is to continue his weight management plan. Troy Peterson will work on [employer] habits and continue with keeping a food journal and adhering to recommended goals of 1500-1700 calories and 120+ protein  Behavioral Intervention Additional resources provided today: patient declined Evidence-based interventions for health behavior change were utilized today including the discussion of self monitoring techniques, problem-solving barriers and SMART goal setting techniques.   Regarding patient's less desirable eating habits and patterns, we  employed the technique of small changes.  Pt will specifically work on: continue with prudent nutritional plan and current exercise regiment for next visit.    Recommended Physical Activity Goals Troy Peterson has been advised to slowly work up to 150 minutes of moderate intensity aerobic activity a week and strengthening exercises 2-3 times per week for cardiovascular health, weight loss maintenance and preservation of muscle mass.   He has agreed to Continue current level of physical activity   FOLLOW UP: Return in about 4 weeks (around 12/01/2022). He was informed of the importance of frequent follow up visits to maximize his success with intensive lifestyle modifications for his multiple health conditions.  Subjective:   Chief complaint: Obesity Troy Peterson is here to discuss his progress with his obesity treatment plan. He is on the keeping a food journal and adhering to recommended goals of 1500-1700 calories and 120+ protein and states he is following his eating plan approximately 97% of the time. He states he is walking 30 minutes 5 days per week.  Interval History:  Troy Peterson is here for a follow  up office visit. This is patient's 28 office visit with [place].    Since last office visit:   - Has not started doing strength training exercises 3 days a week, 3 sets of 10.  - Drinking 9-10 bottles of water daily.  - When eating on plan, his hunger and cravings are well controlled.     Pharmacotherapy for weight loss: He is currently taking  Metformin  for medical weight loss.  Denies side effects.    Review of Systems:  Pertinent positives were addressed with patient today.  Weight Summary and Biometrics   See synopsis for detailed data    Objective:   PHYSICAL EXAM: Blood pressure 116/66, pulse (!) 59, temperature 98.1 F (36.7 C), height 6\' 2"  (1.88 m), weight 279 lb (126.6 kg), SpO2 98 %. Body mass index is 35.82 kg/m.  General: Well Developed, well nourished, and in no acute  distress.  HEENT: Normocephalic, atraumatic Skin: Warm and dry, cap RF less 2 sec, good turgor Chest:  Normal excursion, shape, no gross abn Respiratory: speaking in full sentences, no conversational dyspnea NeuroM-Sk: Ambulates w/o assistance, moves * 4 Psych: A and O *3, insight good, mood-full  DIAGNOSTIC DATA REVIEWED:  BMET    Component Value Date/Time   NA 141 09/14/2022 1659   K 4.1 09/14/2022 1659   CL 98 09/14/2022 1659   CO2 28 09/14/2022 1659   GLUCOSE 79 09/14/2022 1659   GLUCOSE 107 (H) 07/30/2020 1700   BUN 16 09/14/2022 1659   CREATININE 0.75 (L) 09/14/2022 1659   CALCIUM 9.7 09/14/2022 1659   GFRNONAA >60 02/08/2008 0445   GFRAA  02/08/2008 0445    >60        The eGFR has been calculated using the MDRD equation. This calculation has not been validated in all clinical   Lab Results  Component Value Date   HGBA1C 5.5 09/14/2022   HGBA1C 6.0 (H) 10/28/2020   Lab Results  Component Value Date   INSULIN 7.2 09/14/2022   INSULIN 17.7 10/28/2020   Lab Results  Component Value Date   TSH 1.100 09/14/2022   CBC    Component Value Date/Time   WBC 11.0 (H) 07/30/2020 1700   RBC 4.97 07/30/2020 1700   HGB 13.7 07/30/2020 1700   HCT 41.4 07/30/2020 1700   PLT 256.0 07/30/2020 1700   MCV 83.3 07/30/2020 1700   MCHC 33.1 07/30/2020 1700   RDW 13.9 07/30/2020 1700   Iron Studies No results found for: "IRON", "TIBC", "FERRITIN", "IRONPCTSAT" Lipid Panel     Component Value Date/Time   CHOL 151 09/14/2022 1659   TRIG 77 09/14/2022 1659   HDL 51 09/14/2022 1659   CHOLHDL 4 07/30/2020 1700   VLDL 16.6 07/30/2020 1700   LDLCALC 85 09/14/2022 1659   LDLDIRECT 102.0 07/25/2016 1600   Hepatic Function Panel     Component Value Date/Time   PROT 7.3 09/14/2022 1659   ALBUMIN 4.4 09/14/2022 1659   AST 21 09/14/2022 1659   ALT 29 09/14/2022 1659   ALKPHOS 82 09/14/2022 1659   BILITOT 0.8 09/14/2022 1659      Component Value Date/Time   TSH 1.100  09/14/2022 1659   Nutritional Lab Results  Component Value Date   VD25OH 48.8 09/14/2022   VD25OH 29.9 (L) 12/20/2021   VD25OH 37.6 08/02/2021    Attestations:   Reviewed by clinician on day of visit: allergies, medications, problem list, medical history, surgical history, family history, social history, and previous encounter notes.  I,Special Puri,acting as a Neurosurgeon for [employer], DO.,have documented all relevant documentation on the behalf of Thomasene Lot, DO,as directed by  Thomasene Lot, DO while in the presence of Thomasene Lot, DO.  I, Thomasene Lot, DO, have reviewed all documentation for this visit. The documentation on 11/14/22 for the exam, diagnosis, procedures, and orders are all accurate and complete.

## 2022-12-01 ENCOUNTER — Ambulatory Visit (INDEPENDENT_AMBULATORY_CARE_PROVIDER_SITE_OTHER): Payer: 59 | Admitting: Family Medicine

## 2022-12-07 ENCOUNTER — Other Ambulatory Visit (INDEPENDENT_AMBULATORY_CARE_PROVIDER_SITE_OTHER): Payer: Self-pay | Admitting: Family Medicine

## 2022-12-07 DIAGNOSIS — R7303 Prediabetes: Secondary | ICD-10-CM

## 2022-12-07 DIAGNOSIS — I10 Essential (primary) hypertension: Secondary | ICD-10-CM

## 2022-12-08 ENCOUNTER — Ambulatory Visit (INDEPENDENT_AMBULATORY_CARE_PROVIDER_SITE_OTHER): Payer: 59 | Admitting: Family Medicine

## 2022-12-08 ENCOUNTER — Encounter (INDEPENDENT_AMBULATORY_CARE_PROVIDER_SITE_OTHER): Payer: Self-pay | Admitting: Family Medicine

## 2022-12-08 VITALS — BP 127/81 | HR 66 | Temp 97.8°F | Ht 74.0 in | Wt 283.0 lb

## 2022-12-08 DIAGNOSIS — R7303 Prediabetes: Secondary | ICD-10-CM | POA: Diagnosis not present

## 2022-12-08 DIAGNOSIS — E559 Vitamin D deficiency, unspecified: Secondary | ICD-10-CM

## 2022-12-08 DIAGNOSIS — Z6836 Body mass index (BMI) 36.0-36.9, adult: Secondary | ICD-10-CM

## 2022-12-08 DIAGNOSIS — I10 Essential (primary) hypertension: Secondary | ICD-10-CM | POA: Diagnosis not present

## 2022-12-08 DIAGNOSIS — K5909 Other constipation: Secondary | ICD-10-CM

## 2022-12-08 MED ORDER — VITAMIN D (ERGOCALCIFEROL) 1.25 MG (50000 UNIT) PO CAPS: 50000.0000 [IU] | ORAL_CAPSULE | ORAL | 0 refills | Status: DC

## 2022-12-08 MED ORDER — POLYETHYLENE GLYCOL 3350 17 GM/SCOOP PO POWD
17.0000 g | Freq: Every day | ORAL | 0 refills | Status: AC
Start: 2022-12-08 — End: 2023-01-11

## 2022-12-08 MED ORDER — LOSARTAN POTASSIUM-HCTZ 100-12.5 MG PO TABS: 1.0000 | ORAL_TABLET | Freq: Every day | ORAL | 0 refills | Status: DC

## 2022-12-08 MED ORDER — METFORMIN HCL 500 MG PO TABS
500.0000 mg | ORAL_TABLET | Freq: Two times a day (BID) | ORAL | 0 refills | Status: DC
Start: 2022-12-08 — End: 2023-01-16

## 2022-12-08 NOTE — Progress Notes (Signed)
[name], D.O.  ABFM, ABOM Specializing in Clinical Bariatric Medicine  Office located at: [address] [place], [place]  [address]     Assessment and Plan:   Medications Discontinued During This Encounter  Medication Reason   losartan-hydrochlorothiazide (HYZAAR) 100-12.5 MG tablet Reorder   metFORMIN (GLUCOPHAGE) 500 MG tablet Reorder   polyethylene glycol powder (GLYCOLAX/MIRALAX) 17 GM/SCOOP powder Reorder   Vitamin D, Ergocalciferol, (DRISDOL) 1.25 MG (50000 UNIT) CAPS capsule Reorder     Meds ordered this encounter  Medications   losartan-hydrochlorothiazide (HYZAAR) 100-12.5 MG tablet    Sig: Take 1 tablet by mouth daily.    Dispense:  30 tablet    Refill:  0   metFORMIN (GLUCOPHAGE) 500 MG tablet    Sig: Take 1 tablet (500 mg total) by mouth 2 (two) times daily with a meal.    Dispense:  60 tablet    Refill:  0   Vitamin D, Ergocalciferol, (DRISDOL) 1.25 MG (50000 UNIT) CAPS capsule    Sig: Take 1 capsule (50,000 Units total) by mouth every 7 (seven) days.    Dispense:  12 capsule    Refill:  0   polyethylene glycol powder (GLYCOLAX/MIRALAX) 17 GM/SCOOP powder    Sig: Take 17 g by mouth daily for 34 doses.    Dispense:  578 g    Refill:  0     Come fasting and 30 minutes prior for repeat IC next OV   Essential hypertension Assessment: His blood pressure is stable today. No concerns. No issues with Cozaar 100 mg daily and Hyzaar 100-12.5 mg daily. Denies any adverse effects.  Last 3 blood pressure readings in our office are as follows: BP Readings from Last 3 Encounters:  12/08/22 127/81  11/03/22 116/66  10/10/22 112/74   Plan: Continue with all antihypertensive medications as prescribed. Will refill Hyzaar today.    Continue with Prudent nutritional plan and low sodium diet, advance exercise as tolerated.  We will continue to monitor closely alongside PCP/ specialists.  Pt reminded to also f/up with those individuals as instructed by  them. We will continue to monitor symptoms as they relate to the his weight loss journey.    Pre-diabetes Assessment: Condition is stable. No issues with Metformin 500 mg BID. Denies any N/V/D. He has mostly been eating all the protein on the meal plan and endorses that his hunger and cravings are controlled.   Lab Results  Component Value Date   HGBA1C 5.5 09/14/2022   HGBA1C 5.6 12/20/2021   HGBA1C 5.8 (H) 08/02/2021   INSULIN 7.2 09/14/2022   INSULIN 7.2 12/20/2021   INSULIN 7.0 08/02/2021    Plan: Continue with med as prescribed. Will refill this today.     Continue his prudent nutritional plan that is low in simple carbohydrates, saturated fats and trans fats to goal of 5-10% weight loss to achieve significant health benefits.  Pt encouraged to continually advance exercise and cardiovascular fitness as tolerated throughout weight loss journey. Will continue to monitor condition closely alongside PCP.    Vitamin D deficiency Assessment: Condition is almost at goal. Compliance of Ergocalciferol 50K IU weekly good since last labs were reviewed. Denies any side effects.  Lab Results  Component Value Date   VD25OH 48.8 09/14/2022   VD25OH 29.9 (L) 12/20/2021   VD25OH 37.6 08/02/2021   Plan: Continue with supplement. Will reorder this today.    Weight loss will likely improve availability of vitamin D, thus encouraged Troy Peterson to  continue with meal plan and their weight loss efforts to further improve this condition.  Thus, we will need to monitor levels regularly (every 3-4 mo on average) to keep levels within normal limits and prevent over supplementation.   Other constipation Assessment: Condition is not optimized. Patient endorses that he has been constipated recently. He requests a refill for his polyethlyene glycol powder, which he has been using daily.   Plan: Continue with polyethylene glycol powder. Will refill this today.    Your goal is to have one soft bowel movement  each day. Drink at least half of your weight in ounces of water per day unless otherwise noted by one of your doctors that you must restrict water intake.  Eat plenty of fiber (goal is over 25 grams each day).  It is best to get most of your fiber from dietary sources which includes leafy green vegetables, fresh fruit, and whole grains. . If all of these changes do not work, contact your PCP.    TREATMENT PLAN FOR OBESITY: BMI 36.0-36.9,adult-current bmi 36.32 Morbid obesity (HCC)-start bmi 47.25 Assessment: Troy Peterson is here to discuss his progress with his obesity treatment plan along with follow-up of his obesity related diagnoses. See Medical Weight Management Flowsheet for complete bioelectrical impedance results.  Condition is not optimized. Biometric data collected today, was reviewed with patient.   Since last office visit on 11/03/22 patient's  Muscle mass has increased by 3.6 lb. Fat mass has increased by 0.8 lb. Total body water has decreased by 0.2 lb.  Counseling done on how various foods will affect these numbers and how to maximize success  Total lbs lost to date: 70 Total weight loss percentage to date: 19.83   Plan: Based on his REE of 3024 on 10/04/21, we agreed to increase his daily calorie goals to 2,000 - 2,400 and his protein to 155+.   - Recommended patient to incorporate healthy fats (olive oil, nuts, sunflower seeds, etc) into his diet if he is under in calories. I also encouraged patient to avoid foods that are high in saturated and trans-fats. Polyunsaturated and monosaturated fats are good.   Behavioral Intervention Additional resources provided today:  food journaling log Evidence-based interventions for health behavior change were utilized today including the discussion of self monitoring techniques, problem-solving barriers and SMART goal setting techniques.   Regarding patient's less desirable eating habits and patterns, we employed the technique of small  changes.  Pt will specifically work on: continue exercise regiment and  prescribed meal plan for next visit.    Recommended Physical Activity Goals  Troy Peterson has been advised to slowly work up to 150 minutes of moderate intensity aerobic activity a week and strengthening exercises 2-3 times per week for cardiovascular health, weight loss maintenance and preservation of muscle mass.   He has agreed to Continue current level of physical activity   FOLLOW UP: Return in about 4 weeks (around 01/05/2023). He was informed of the importance of frequent follow up visits to maximize his success with intensive lifestyle modifications for his multiple health conditions.  Subjective:   Chief complaint: Obesity Troy Peterson is here to discuss his progress with his obesity treatment plan. He is on the keeping a food journal and adhering to recommended goals of 1500-1700 calories and 120+ protein and states he is following his eating plan approximately 97% of the time. He states he is walking 30-45 minutes 5 days per week.  Interval History:  Troy Peterson is here for  a follow up office visit. Since last office visit, Troy Peterson has been frustrated with his recent lack of weight loss. He spoke with his weight lifting friend and questions why he was placed on a calorie range of 1500-1700. Patient feels that he needs more calories. Troy Peterson also endorses struggling with eating healthy during several graduation parties. Overall, his hunger and cravings are mostly well controlled.   Pharmacotherapy for weight loss: He is currently taking  Metformin  for medical weight loss.  Denies side effects.    Review of Systems:  Pertinent positives were addressed with patient today.  Reviewed by clinician on day of visit: allergies, medications, problem list, medical history, surgical history, family history, social history, and previous encounter notes.  Weight Summary and Biometrics   Weight Lost Since Last Visit: 0  Weight  Gained Since Last Visit: 4lb    Vitals Temp: 97.8 F (36.6 C) BP: 127/81 Pulse Rate: 66 SpO2: 97 %   Anthropometric Measurements Height: 6\' 2"  (1.88 m) Weight: 283 lb (128.4 kg) BMI (Calculated): 36.32 Weight at Last Visit: 279lb Weight Lost Since Last Visit: 0 Weight Gained Since Last Visit: 4lb Starting Weight: 353lb Total Weight Loss (lbs): 70 lb (31.8 kg)   Body Composition  Body Fat %: 33.6 % Fat Mass (lbs): 95.2 lbs Muscle Mass (lbs): 179.2 lbs Total Body Water (lbs): 137.6 lbs Visceral Fat Rating : 17   Other Clinical Data Fasting: no Labs: no Today's Visit #: 29 Starting Date: 11/07/20   Objective:   PHYSICAL EXAM: Blood pressure 127/81, pulse 66, temperature 97.8 F (36.6 C), height 6\' 2"  (1.88 m), weight 283 lb (128.4 kg), SpO2 97 %. Body mass index is 36.34 kg/m.  General: Well Developed, well nourished, and in no acute distress.  HEENT: Normocephalic, atraumatic Skin: Warm and dry, cap RF less 2 sec, good turgor Chest:  Normal excursion, shape, no gross abn Respiratory: speaking in full sentences, no conversational dyspnea NeuroM-Sk: Ambulates w/o assistance, moves * 4 Psych: A and O *3, insight good, mood-full  DIAGNOSTIC DATA REVIEWED:  BMET    Component Value Date/Time   NA 141 09/14/2022 1659   K 4.1 09/14/2022 1659   CL 98 09/14/2022 1659   CO2 28 09/14/2022 1659   GLUCOSE 79 09/14/2022 1659   GLUCOSE 107 (H) 07/30/2020 1700   BUN 16 09/14/2022 1659   CREATININE 0.75 (L) 09/14/2022 1659   CALCIUM 9.7 09/14/2022 1659   GFRNONAA >60 02/08/2008 0445   GFRAA  02/08/2008 0445    >60        The eGFR has been calculated using the MDRD equation. This calculation has not been validated in all clinical   Lab Results  Component Value Date   HGBA1C 5.5 09/14/2022   HGBA1C 6.0 (H) 10/28/2020   Lab Results  Component Value Date   INSULIN 7.2 09/14/2022   INSULIN 17.7 10/28/2020   Lab Results  Component Value Date   TSH 1.100  09/14/2022   CBC    Component Value Date/Time   WBC 11.0 (H) 07/30/2020 1700   RBC 4.97 07/30/2020 1700   HGB 13.7 07/30/2020 1700   HCT 41.4 07/30/2020 1700   PLT 256.0 07/30/2020 1700   MCV 83.3 07/30/2020 1700   MCHC 33.1 07/30/2020 1700   RDW 13.9 07/30/2020 1700   Iron Studies No results found for: "IRON", "TIBC", "FERRITIN", "IRONPCTSAT" Lipid Panel     Component Value Date/Time   CHOL 151 09/14/2022 1659   TRIG 77 09/14/2022 1659  HDL 51 09/14/2022 1659   CHOLHDL 4 07/30/2020 1700   VLDL 16.6 07/30/2020 1700   LDLCALC 85 09/14/2022 1659   LDLDIRECT 102.0 07/25/2016 1600   Hepatic Function Panel     Component Value Date/Time   PROT 7.3 09/14/2022 1659   ALBUMIN 4.4 09/14/2022 1659   AST 21 09/14/2022 1659   ALT 29 09/14/2022 1659   ALKPHOS 82 09/14/2022 1659   BILITOT 0.8 09/14/2022 1659      Component Value Date/Time   TSH 1.100 09/14/2022 1659   Nutritional Lab Results  Component Value Date   VD25OH 48.8 09/14/2022   VD25OH 29.9 (L) 12/20/2021   VD25OH 37.6 08/02/2021    Attestations:   I, Special Puri, acting as a Stage manager for Thomasene Lot, DO., have compiled all relevant documentation for today's office visit on behalf of Thomasene Lot, DO, while in the presence of [employer], DO.  I have reviewed the above documentation for accuracy and completeness, and I agree with the above. [name], D.O.  The 21st Century Cures Act was signed into law in 2016 which includes the topic of electronic health records.  This provides immediate access to information in MyChart.  This includes consultation notes, operative notes, office notes, lab results and pathology reports.  If you have any questions about what you read please let us know at your next visit so we can discuss your concerns and take corrective action if need be.  We are right here with you.

## 2023-01-16 ENCOUNTER — Encounter (INDEPENDENT_AMBULATORY_CARE_PROVIDER_SITE_OTHER): Payer: Self-pay | Admitting: Family Medicine

## 2023-01-16 ENCOUNTER — Ambulatory Visit (INDEPENDENT_AMBULATORY_CARE_PROVIDER_SITE_OTHER): Payer: 59 | Admitting: Family Medicine

## 2023-01-16 VITALS — BP 126/70 | HR 57 | Temp 98.7°F | Ht 74.0 in | Wt 288.0 lb

## 2023-01-16 DIAGNOSIS — Z6836 Body mass index (BMI) 36.0-36.9, adult: Secondary | ICD-10-CM

## 2023-01-16 DIAGNOSIS — I10 Essential (primary) hypertension: Secondary | ICD-10-CM

## 2023-01-16 DIAGNOSIS — K5900 Constipation, unspecified: Secondary | ICD-10-CM

## 2023-01-16 DIAGNOSIS — R7303 Prediabetes: Secondary | ICD-10-CM | POA: Diagnosis not present

## 2023-01-16 DIAGNOSIS — E559 Vitamin D deficiency, unspecified: Secondary | ICD-10-CM

## 2023-01-16 MED ORDER — POLYETHYLENE GLYCOL 3350 17 GM/SCOOP PO POWD: 17.0000 g | Freq: Two times a day (BID) | ORAL | 0 refills | Status: DC | PRN

## 2023-01-16 MED ORDER — LOSARTAN POTASSIUM-HCTZ 100-12.5 MG PO TABS
1.0000 | ORAL_TABLET | Freq: Every day | ORAL | 0 refills | Status: DC
Start: 2023-01-16 — End: 2023-02-23

## 2023-01-16 MED ORDER — METFORMIN HCL 500 MG PO TABS
500.0000 mg | ORAL_TABLET | Freq: Two times a day (BID) | ORAL | 0 refills | Status: DC
Start: 2023-01-16 — End: 2023-02-23

## 2023-01-16 NOTE — Progress Notes (Signed)
[name], D.O.  ABFM, ABOM Specializing in Clinical Bariatric Medicine  Office located at: [address] [place], [place]  [address]     Assessment and Plan:   Medications Discontinued During This Encounter  Medication Reason   losartan-hydrochlorothiazide (HYZAAR) 100-12.5 MG tablet Reorder   metFORMIN (GLUCOPHAGE) 500 MG tablet Reorder    Meds ordered this encounter  Medications   metFORMIN (GLUCOPHAGE) 500 MG tablet    Sig: Take 1 tablet (500 mg total) by mouth 2 (two) times daily with a meal.    Dispense:  60 tablet    Refill:  0   losartan-hydrochlorothiazide (HYZAAR) 100-12.5 MG tablet    Sig: Take 1 tablet by mouth daily.    Dispense:  30 tablet    Refill:  0   polyethylene glycol powder (GLYCOLAX/MIRALAX) 17 GM/SCOOP powder    Sig: Take 17 g by mouth 2 (two) times daily as needed.    Dispense:  850 g    Refill:  0    Vitamin D deficiency Assessment: Condition is Not at goal. Pt continues Ergocalciferol once weekly without difficulty.  Lab Results  Component Value Date   VD25OH 48.8 09/14/2022   VD25OH 29.9 (L) 12/20/2021   VD25OH 37.6 08/02/2021   Plan: - Continue Ergocalciferol 50K IU weekly. He denies need for refill.   - Continue his prudent nutritional plan that is low in simple carbohydrates, saturated fats and trans fats to goal of 5-10% weight loss to achieve significant health benefits.  Pt encouraged to continually advance exercise and cardiovascular fitness as tolerated throughout weight loss journey.   Pre-diabetes Assessment: Condition is Controlled.. Patient endorses that he has no hunger or cravings. Continues Metformin and denies any N/V/D or GI upset. He tolerates it well with no adverse side effects.  Lab Results  Component Value Date   HGBA1C 5.5 09/14/2022   HGBA1C 5.6 12/20/2021   HGBA1C 5.8 (H) 08/02/2021   INSULIN 7.2 09/14/2022   INSULIN 7.2 12/20/2021   INSULIN 7.0 08/02/2021    Plan: - Continue Metformin 500 mg  BID. Will refill today.    - Continue to decrease simple carbs/ sugars; increase fiber and proteins -> follow his meal plan. Explained role of simple carbs and insulin levels on hunger and cravings  - Anticipatory guidance given.    Troy Peterson will continue to work on weight loss, exercise, via their meal plan we devised to help decrease the risk of progressing to diabetes.   - We will recheck A1c and fasting insulin level in approximately 3 months from last check, or as deemed appropriate.    Essential hypertension Assessment: Condition is Controlled.. This is well controlled with Hyzaar. He tolerates this well with no adverse side effects.  Last 3 blood pressure readings in our office are as follows: BP Readings from Last 3 Encounters:  01/16/23 126/70  12/08/22 127/81  11/03/22 116/66   Plan: - BP is 126/70 at goal today.   - Continue Hyzaar 100-12.5mg  daily as directed by Dr. Val Eagle. Will refill today.   - Lifestyle changes such as following our low salt, heart healthy meal plan and engaging in a regular exercise program discussed. Avoid buying foods that are: processed, frozen, or prepackaged to avoid excess salt.  - Ambulatory blood pressure monitoring encouraged.   - We will continue to monitor closely alongside PCP/ specialists as they relate to the his weight loss journey.   Constipation, unspecified constipation type Assessment: Condition is Improving, but not  optimized. Pt informed me that he would need a refill on MriaLax. He continues to take Miralax once daily in the morning.   Plan: - Continue with Miralax (polyethylene glycol powder) once or twice daily as needed. Will refill today.   - Unless pre-existing renal or cardiopulmonary conditions exist which patient was told to limit their fluid intake by another provider, I recommended roughly one half of their weight in pounds, to be the approximate ounces of non-caloric, non-caffeinated beverages they should drink per day;  including more if they are engaging in exercise.   TREATMENT PLAN FOR OBESITY: BMI 36.0-36.9,adult-current bmi 36.96 Morbid obesity (HCC)-start bmi 47.25 Assessment:  Troy Peterson is here to discuss his progress with his obesity treatment plan along with follow-up of his obesity related diagnoses. See Medical Weight Management Flowsheet for complete bioelectrical impedance results.  Condition is not optimized. Biometric data collected today, was reviewed with patient.   Since last office visit on 12/08/2022 patient's  Muscle mass has increased by 0.8lb. Fat mass has increased by 3.6lb. Total body water has increased by 4.8lb.  Counseling done on how various foods will affect these numbers and how to maximize success  Total lbs lost to date: 65lbs Total weight loss percentage to date: 18.41%   Plan: - Continue to journal and track his food intake. Continue to adhere to the 2,000 - 2,400 and his protein to 155g+ goal.   - Return early fasting for repeat IC.   - I recommended his heart rate to be between 123-130 to burn fat.   Behavioral Intervention Additional resources provided today: patient declined Evidence-based interventions for health behavior change were utilized today including the discussion of self monitoring techniques, problem-solving barriers and SMART goal setting techniques.   Regarding patient's less desirable eating habits and patterns, we employed the technique of small changes.  Pt will specifically work on: write down calorie and protein intake and bring food log next OV for next visit.    Recommended Physical Activity Goals  Troy Peterson has been advised to slowly work up to 150 minutes of moderate intensity aerobic activity a week and strengthening exercises 2-3 times per week for cardiovascular health, weight loss maintenance and preservation of muscle mass.   He has agreed to Continue current level of physical activity    FOLLOW UP: No follow-ups on file.  He was informed of the importance of frequent follow up visits to maximize his success with intensive lifestyle modifications for his multiple health conditions.  Subjective:  Chief complaint: Obesity Troy Peterson is here to discuss his progress with his obesity treatment plan. He is on the keeping a food journal and adhering to recommended goals of 2000-2400 calories and 155+ protein and states he is following his eating plan approximately 97% of the time. He states he is walking 30 minutes 5 days per week.  Interval History:  Troy Peterson is here for a follow up office visit. Since last OV he has been well. Pt was supposed to have a repeat IC done but did not come in fasting so it could not be done.   - He recently returned from the beach and feels as he ate good and on plan. Until they returned as he had a birthday party and didn't want to cook within the same weekend.  - Pt tracks his protein and calorie intake on a mobile app.  - He feels as he occasionally hits most of his intake goals.  - Pt usually snacks  on tuna, yogurt, skinny pop, and veggie straws.    Pharmacotherapy for weight loss: He is currently taking  Metformin  for medical weight loss.  Denies side effects.    Review of Systems:  Pertinent positives were addressed with patient today.  Reviewed by clinician on day of visit: allergies, medications, problem list, medical history, surgical history, family history, social history, and previous encounter notes.  Weight Summary and Biometrics   Weight Lost Since Last Visit: 0lb  Weight Gained Since Last Visit: 5lb   Vitals Temp: 98.7 F (37.1 C) BP: 126/70 Pulse Rate: (!) 57 SpO2: 98 %   Anthropometric Measurements Height: 6\' 2"  (1.88 m) Weight: 288 lb (130.6 kg) BMI (Calculated): 36.96 Weight at Last Visit: 283lb Weight Lost Since Last Visit: 0lb Weight Gained Since Last Visit: 5lb Starting Weight: 353lb Total Weight Loss (lbs): 65 lb (29.5 kg)   Body  Composition  Body Fat %: 34.3 % Fat Mass (lbs): 98.8 lbs Muscle Mass (lbs): 180 lbs Total Body Water (lbs): 142.4 lbs Visceral Fat Rating : 18   Other Clinical Data Fasting: no Labs: no Today's Visit #: 30 Starting Date: 11/07/20     Objective:   PHYSICAL EXAM: Blood pressure 126/70, pulse (!) 57, temperature 98.7 F (37.1 C), height 6\' 2"  (1.88 m), weight 288 lb (130.6 kg), SpO2 98%. Body mass index is 36.98 kg/m.  General: Well Developed, well nourished, and in no acute distress.  HEENT: Normocephalic, atraumatic Skin: Warm and dry, cap RF less 2 sec, good turgor Chest:  Normal excursion, shape, no gross abn Respiratory: speaking in full sentences, no conversational dyspnea NeuroM-Sk: Ambulates w/o assistance, moves * 4 Psych: A and O *3, insight good, mood-full  DIAGNOSTIC DATA REVIEWED:  BMET    Component Value Date/Time   NA 141 09/14/2022 1659   K 4.1 09/14/2022 1659   CL 98 09/14/2022 1659   CO2 28 09/14/2022 1659   GLUCOSE 79 09/14/2022 1659   GLUCOSE 107 (H) 07/30/2020 1700   BUN 16 09/14/2022 1659   CREATININE 0.75 (L) 09/14/2022 1659   CALCIUM 9.7 09/14/2022 1659   GFRNONAA >60 02/08/2008 0445   GFRAA  02/08/2008 0445    >60        The eGFR has been calculated using the MDRD equation. This calculation has not been validated in all clinical   Lab Results  Component Value Date   HGBA1C 5.5 09/14/2022   HGBA1C 6.0 (H) 10/28/2020   Lab Results  Component Value Date   INSULIN 7.2 09/14/2022   INSULIN 17.7 10/28/2020   Lab Results  Component Value Date   TSH 1.100 09/14/2022   CBC    Component Value Date/Time   WBC 11.0 (H) 07/30/2020 1700   RBC 4.97 07/30/2020 1700   HGB 13.7 07/30/2020 1700   HCT 41.4 07/30/2020 1700   PLT 256.0 07/30/2020 1700   MCV 83.3 07/30/2020 1700   MCHC 33.1 07/30/2020 1700   RDW 13.9 07/30/2020 1700   Iron Studies No results found for: "IRON", "TIBC", "FERRITIN", "IRONPCTSAT" Lipid Panel      Component Value Date/Time   CHOL 151 09/14/2022 1659   TRIG 77 09/14/2022 1659   HDL 51 09/14/2022 1659   CHOLHDL 4 07/30/2020 1700   VLDL 16.6 07/30/2020 1700   LDLCALC 85 09/14/2022 1659   LDLDIRECT 102.0 07/25/2016 1600   Hepatic Function Panel     Component Value Date/Time   PROT 7.3 09/14/2022 1659   ALBUMIN 4.4 09/14/2022 1659  AST 21 09/14/2022 1659   ALT 29 09/14/2022 1659   ALKPHOS 82 09/14/2022 1659   BILITOT 0.8 09/14/2022 1659      Component Value Date/Time   TSH 1.100 09/14/2022 1659   Nutritional Lab Results  Component Value Date   VD25OH 48.8 09/14/2022   VD25OH 29.9 (L) 12/20/2021   VD25OH 37.6 08/02/2021    Attestations:   I, Clinical biochemist, acting as a Stage manager for [employer], DO., have compiled all relevant documentation for today's office visit on behalf of Thomasene Lot, DO, while in the presence of [employer], DO.  I have reviewed the above documentation for accuracy and completeness, and I agree with the above. [name], D.O.  The 21st Century Cures Act was signed into law in 2016 which includes the topic of electronic health records.  This provides immediate access to information in MyChart.  This includes consultation notes, operative notes, office notes, lab results and pathology reports.  If you have any questions about what you read please let us know at your next visit so we can discuss your concerns and take corrective action if need be.  We are right here with you.

## 2023-02-23 ENCOUNTER — Encounter (INDEPENDENT_AMBULATORY_CARE_PROVIDER_SITE_OTHER): Payer: Self-pay | Admitting: Family Medicine

## 2023-02-23 ENCOUNTER — Ambulatory Visit (INDEPENDENT_AMBULATORY_CARE_PROVIDER_SITE_OTHER): Payer: 59 | Admitting: Family Medicine

## 2023-02-23 VITALS — BP 112/72 | HR 59 | Temp 97.8°F | Ht 74.0 in | Wt 280.0 lb

## 2023-02-23 DIAGNOSIS — E7849 Other hyperlipidemia: Secondary | ICD-10-CM | POA: Diagnosis not present

## 2023-02-23 DIAGNOSIS — Z6835 Body mass index (BMI) 35.0-35.9, adult: Secondary | ICD-10-CM

## 2023-02-23 DIAGNOSIS — E559 Vitamin D deficiency, unspecified: Secondary | ICD-10-CM | POA: Diagnosis not present

## 2023-02-23 DIAGNOSIS — I10 Essential (primary) hypertension: Secondary | ICD-10-CM

## 2023-02-23 DIAGNOSIS — K5900 Constipation, unspecified: Secondary | ICD-10-CM

## 2023-02-23 DIAGNOSIS — R7303 Prediabetes: Secondary | ICD-10-CM | POA: Diagnosis not present

## 2023-02-23 DIAGNOSIS — R0602 Shortness of breath: Secondary | ICD-10-CM

## 2023-02-23 DIAGNOSIS — Z6836 Body mass index (BMI) 36.0-36.9, adult: Secondary | ICD-10-CM

## 2023-02-23 MED ORDER — VITAMIN D (ERGOCALCIFEROL) 1.25 MG (50000 UNIT) PO CAPS
50000.0000 [IU] | ORAL_CAPSULE | ORAL | 0 refills | Status: DC
Start: 2023-02-23 — End: 2023-04-25

## 2023-02-23 MED ORDER — POLYETHYLENE GLYCOL 3350 17 GM/SCOOP PO POWD
17.0000 g | Freq: Two times a day (BID) | ORAL | 1 refills | Status: DC | PRN
Start: 2023-02-23 — End: 2023-04-25

## 2023-02-23 MED ORDER — LOSARTAN POTASSIUM-HCTZ 100-12.5 MG PO TABS
1.0000 | ORAL_TABLET | Freq: Every day | ORAL | 1 refills | Status: DC
Start: 2023-02-23 — End: 2023-03-23

## 2023-02-23 MED ORDER — METFORMIN HCL 500 MG PO TABS
500.0000 mg | ORAL_TABLET | Freq: Two times a day (BID) | ORAL | 1 refills | Status: DC
Start: 2023-02-23 — End: 2023-04-25

## 2023-02-23 NOTE — Progress Notes (Signed)
Troy Peterson, D.O.  ABFM, ABOM Specializing in Clinical Bariatric Medicine  Office located at: [address] [place], [place]  [address]     Assessment and Plan:   Orders Placed This Encounter  Procedures   VITAMIN D 25 Hydroxy (Vit-D Deficiency, Fractures)   Lipid panel   Insulin, random   Hemoglobin A1c   Basic metabolic panel    Medications Discontinued During This Encounter  Medication Reason   Vitamin D, Ergocalciferol, (DRISDOL) 1.25 MG (50000 UNIT) CAPS capsule Reorder   metFORMIN (GLUCOPHAGE) 500 MG tablet Reorder   losartan-hydrochlorothiazide (HYZAAR) 100-12.5 MG tablet Reorder   polyethylene glycol powder (GLYCOLAX/MIRALAX) 17 GM/SCOOP powder Reorder     Meds ordered this encounter  Medications   metFORMIN (GLUCOPHAGE) 500 MG tablet    Sig: Take 1 tablet (500 mg total) by mouth 2 (two) times daily with a meal.    Dispense:  60 tablet    Refill:  1   losartan-hydrochlorothiazide (HYZAAR) 100-12.5 MG tablet    Sig: Take 1 tablet by mouth daily.    Dispense:  30 tablet    Refill:  1   Vitamin D, Ergocalciferol, (DRISDOL) 1.25 MG (50000 UNIT) CAPS capsule    Sig: Take 1 capsule (50,000 Units total) by mouth every 7 (seven) days.    Dispense:  12 capsule    Refill:  0   polyethylene glycol powder (GLYCOLAX/MIRALAX) 17 GM/SCOOP powder    Sig: Take 17 g by mouth 2 (two) times daily as needed.    Dispense:  850 g    Refill:  1     Pre-diabetes Assessment & Plan: Lab Results  Component Value Date   HGBA1C 5.5 09/14/2022   HGBA1C 5.6 12/20/2021   INSULIN 11.1 02/23/2023   INSULIN 7.2 09/14/2022   INSULIN 7.2 12/20/2021   He takes Metformin 500 mg bid, tolerating well - denies any N/V/D. Feels that his hunger and cravings are pretty well controlled. Maintain with Metformin at current dose- Will refill today. Continue to decrease simple carbs/ sugars; increase fiber and proteins -> follow his meal plan.  Will recheck labs today.    Essential  hypertension Assessment & Plan: Last 3 blood pressure readings in our office are as follows: BP Readings from Last 3 Encounters:  02/23/23 112/72  01/16/23 126/70  12/08/22 127/81   HTN treated with Hyzaar 100-12.5 mg daily. Blood pressure stable today. No concerns in this regard today. C/w Hyzaar at current dose - will refill today. We will continue to monitor symptoms as they relate to the his weight loss journey.   Vitamin D deficiency Assessment & Plan: Lab Results  Component Value Date   VD25OH 48.8 09/14/2022   VD25OH 29.9 (L) 12/20/2021   Condition treated with ERGO 50,000 units once a wk. Continue with their weight loss efforts and ERGO at current dose - Will refill today. Will recheck labs.    Constipation, unspecified constipation type Assessment & Plan: Condition is stable- pt reports having daily bowl movements. He is using Miralax once daily & drinks roughly 1 gallon of water. Reminded pt to  drink at least half of your weight in ounces of water per day unless otherwise noted by one of your doctors that you must restrict water intake. Eat plenty of fiber (goal is over 25 grams each day). C/w with Miralax -will refill today. Will continue to monitor condition.    Shortness of breath on exertion Assessment & Plan: Troy Peterson does feel that he gets  out of breath easily if he goes up 2-3 flights of stairs. Troy Peterson denies shortness of breath at rest or orthopnea. Troy Peterson's shortness of breath appears to be obesity related and exercise induced, as they do not appear to have any "red flag" symptoms/concerns today.    Counseling done with patient regarding repeat I.C. results. It shows a VO2 of 376 and a REE of 2592. His calculated basal metabolic rate is 5284. Reviewed aspects of diet and lifestyle today with patient that will increase metabolism.  All questions addressed. Shared decision is to reduce journaling parameters to 1900-2000 calories & 155 + grams protein.     Other  hyperlipidemia Assessment & Plan: No current med's. Diet/exercise approach. Pt has a hx of low HDL & high LDL- will recheck Lipid panel today. C/w treatment plan of a heart-heathy, low cholesterol meal plan   TREATMENT PLAN FOR OBESITY: BMI 36.0-36.9,adult-current bmi 35.93 Morbid obesity (HCC)-start bmi 47.25 Assessment & Plan: Troy Peterson is here to discuss his progress with his obesity treatment plan along with follow-up of his obesity related diagnoses. See Medical Weight Management Flowsheet for complete bioelectrical impedance results.  Since last office visit on 01/16/23 patient's muscle mass has decreased by 1.8 lb. Fat mass has decreased by 6.2 lb. Total body water has decreased by 9.6 lb.  Counseling done on how various foods will affect these numbers and how to maximize success  Total lbs lost to date: 73 lbs  Total weight loss percentage to date: 20.68%  See note under Shortness of breath on exertion for meal plan.  Behavioral Intervention Additional resources provided today: patient declined Evidence-based interventions for health behavior change were utilized today including the discussion of self monitoring techniques, problem-solving barriers and SMART goal setting techniques.   Regarding patient's less desirable eating habits and patterns, we employed the technique of small changes.  Pt will specifically work on: continuing with walking regiment and incorporating weight lifting 2-3 days a wk for next visit.    FOLLOW UP: Return in about 6 weeks (around 04/06/2023). He was informed of the importance of frequent follow up visits to maximize his success with intensive lifestyle modifications for his multiple health conditions.  Troy Peterson is aware that we will review all of his lab results at our next visit.  He is aware that if anything is critical/ life threatening with the results, we will be contacting him via MyChart prior to the office visit to discuss management.       Subjective:   Chief complaint: Obesity Troy Peterson is here to discuss his progress with his obesity treatment plan. He  is keeping a food journal and adhering to recommended goals of 2000-2400 calories and 155+ protein and states he is following his eating plan approximately 97% of the time. He states he is walking 30 minutes 5 days per week.  Interval History:  Troy Peterson is here for a follow up office visit. Since last OV, Pao has been doing well. Has been journaling his intake. He averages 2,000-2,200 calories & 200+ grams protein daily. Drinks over 1 gallon of water daily. Hunger and cravings are well controlled.   Pharmacotherapy for weight loss: He is currently taking  Metformin 500 mg bid  for medical weight loss.  Denies side effects.    Review of Systems:  Pertinent positives were addressed with patient today.  Reviewed by clinician on day of visit: allergies, medications, problem list, medical history, surgical history, family history, social history, and previous  encounter notes.  Weight Summary and Biometrics   Weight Lost Since Last Visit: 8lb  Weight Gained Since Last Visit: 0lb   Vitals Temp: 97.8 F (36.6 C) BP: 112/72 Pulse Rate: (!) 59 SpO2: 98 %   Anthropometric Measurements Height: 6\' 2"  (1.88 m) Weight: 280 lb (127 kg) BMI (Calculated): 35.93 Weight at Last Visit: 288lb Weight Lost Since Last Visit: 8lb Weight Gained Since Last Visit: 0lb Starting Weight: 353lb Total Weight Loss (lbs): 73 lb (33.1 kg)   Body Composition  Body Fat %: 33.1 % Fat Mass (lbs): 92.6 lbs Muscle Mass (lbs): 178.2 lbs Total Body Water (lbs): 132.8 lbs Visceral Fat Rating : 17   Other Clinical Data Fasting: yes Labs: no Today's Visit #: 31 Starting Date: 11/07/20    Objective:   PHYSICAL EXAM: Blood pressure 112/72, pulse (!) 59, temperature 97.8 F (36.6 C), height 6\' 2"  (1.88 m), weight 280 lb (127 kg), SpO2 98%. Body mass index is 35.95  kg/m.  General: Well Developed, well nourished, and in no acute distress.  HEENT: Normocephalic, atraumatic Skin: Warm and dry, cap RF less 2 sec, good turgor Chest:  Normal excursion, shape, no gross abn Respiratory: speaking in full sentences, no conversational dyspnea NeuroM-Sk: Ambulates w/o assistance, moves * 4 Psych: A and O *3, insight good, mood-full  DIAGNOSTIC DATA REVIEWED:  BMET    Component Value Date/Time   NA 141 09/14/2022 1659   K 4.1 09/14/2022 1659   CL 98 09/14/2022 1659   CO2 28 09/14/2022 1659   GLUCOSE 79 09/14/2022 1659   GLUCOSE 107 (H) 07/30/2020 1700   BUN 16 09/14/2022 1659   CREATININE 0.75 (L) 09/14/2022 1659   CALCIUM 9.7 09/14/2022 1659   GFRNONAA >60 02/08/2008 0445   GFRAA  02/08/2008 0445    >60        The eGFR has been calculated using the MDRD equation. This calculation has not been validated in all clinical   Lab Results  Component Value Date   HGBA1C 5.5 09/14/2022   HGBA1C 6.0 (H) 10/28/2020   Lab Results  Component Value Date   INSULIN 7.2 09/14/2022   INSULIN 17.7 10/28/2020   Lab Results  Component Value Date   TSH 1.100 09/14/2022   CBC    Component Value Date/Time   WBC 11.0 (H) 07/30/2020 1700   RBC 4.97 07/30/2020 1700   HGB 13.7 07/30/2020 1700   HCT 41.4 07/30/2020 1700   PLT 256.0 07/30/2020 1700   MCV 83.3 07/30/2020 1700   MCHC 33.1 07/30/2020 1700   RDW 13.9 07/30/2020 1700   Iron Studies No results found for: "IRON", "TIBC", "FERRITIN", "IRONPCTSAT" Lipid Panel     Component Value Date/Time   CHOL 151 09/14/2022 1659   TRIG 77 09/14/2022 1659   HDL 51 09/14/2022 1659   CHOLHDL 4 07/30/2020 1700   VLDL 16.6 07/30/2020 1700   LDLCALC 85 09/14/2022 1659   LDLDIRECT 102.0 07/25/2016 1600   Hepatic Function Panel     Component Value Date/Time   PROT 7.3 09/14/2022 1659   ALBUMIN 4.4 09/14/2022 1659   AST 21 09/14/2022 1659   ALT 29 09/14/2022 1659   ALKPHOS 82 09/14/2022 1659   BILITOT  0.8 09/14/2022 1659      Component Value Date/Time   TSH 1.100 09/14/2022 1659   Nutritional Lab Results  Component Value Date   VD25OH 48.8 09/14/2022   VD25OH 29.9 (L) 12/20/2021   VD25OH 37.6 08/02/2021    Attestations:  Patient was in the office today and time spent on visit including pre-visit chart review and post-visit care/coordination of care and electronic medical record documentation was 43 minutes. 50% of the time was in face to face counseling of this patient's medical condition(s) and providing education on treatment options to include the first-line treatment of diet and lifestyle modification.   I, Special Randolm Idol, acting as a Stage manager for [employer], DO., have compiled all relevant documentation for today's office visit on behalf of Thomasene Lot, DO, while in the presence of [employer], DO.  I have reviewed the above documentation for accuracy and completeness, and I agree with the above. Troy Peterson, D.O.  The 21st Century Cures Act was signed into law in 2016 which includes the topic of electronic health records.  This provides immediate access to information in MyChart.  This includes consultation notes, operative notes, office notes, lab results and pathology reports.  If you have any questions about what you read please let us know at your next visit so we can discuss your concerns and take corrective action if need be.  We are right here with you.

## 2023-02-24 LAB — BASIC METABOLIC PANEL
BUN/Creatinine Ratio: 23 — ABNORMAL HIGH (ref 9–20)
BUN: 19 mg/dL (ref 6–24)
CO2: 26 mmol/L (ref 20–29)
Calcium: 9.3 mg/dL (ref 8.7–10.2)
Chloride: 101 mmol/L (ref 96–106)
Creatinine, Ser: 0.83 mg/dL (ref 0.76–1.27)
Glucose: 91 mg/dL (ref 70–99)
Potassium: 4.4 mmol/L (ref 3.5–5.2)
Sodium: 139 mmol/L (ref 134–144)
eGFR: 111 mL/min/{1.73_m2} (ref >59)

## 2023-02-24 LAB — LIPID PANEL
Chol/HDL Ratio: 3.3 ratio (ref 0.0–5.0)
Cholesterol, Total: 137 mg/dL (ref 100–199)
HDL: 42 mg/dL (ref >39)
LDL Chol Calc (NIH): 77 mg/dL (ref 0–99)
Triglycerides: 97 mg/dL (ref 0–149)
VLDL Cholesterol Cal: 18 mg/dL (ref 5–40)

## 2023-02-24 LAB — VITAMIN D 25 HYDROXY (VIT D DEFICIENCY, FRACTURES): Vit D, 25-Hydroxy: 54.3 ng/mL (ref 30.0–100.0)

## 2023-02-24 LAB — HEMOGLOBIN A1C
Est. average glucose Bld gHb Est-mCnc: 111 mg/dL
Hgb A1c MFr Bld: 5.5 % (ref 4.8–5.6)

## 2023-02-24 LAB — INSULIN, RANDOM: INSULIN: 11.1 u[IU]/mL (ref 2.6–24.9)

## 2023-03-23 ENCOUNTER — Ambulatory Visit: Payer: 59 | Admitting: Family Medicine

## 2023-03-23 ENCOUNTER — Encounter: Payer: Self-pay | Admitting: Family Medicine

## 2023-03-23 VITALS — BP 114/74 | HR 67 | Wt 280.0 lb

## 2023-03-23 DIAGNOSIS — G4733 Obstructive sleep apnea (adult) (pediatric): Secondary | ICD-10-CM

## 2023-03-23 DIAGNOSIS — N529 Male erectile dysfunction, unspecified: Secondary | ICD-10-CM | POA: Diagnosis not present

## 2023-03-23 DIAGNOSIS — Z23 Encounter for immunization: Secondary | ICD-10-CM | POA: Diagnosis not present

## 2023-03-23 DIAGNOSIS — I10 Essential (primary) hypertension: Secondary | ICD-10-CM | POA: Diagnosis not present

## 2023-03-23 MED ORDER — LOSARTAN POTASSIUM-HCTZ 100-12.5 MG PO TABS
0.5000 | ORAL_TABLET | Freq: Every day | ORAL | Status: DC
Start: 2023-03-23 — End: 2023-05-29

## 2023-03-23 MED ORDER — TADALAFIL 20 MG PO TABS
10.0000 mg | ORAL_TABLET | ORAL | 11 refills | Status: DC | PRN
Start: 2023-03-23 — End: 2024-02-05

## 2023-03-23 NOTE — Patient Instructions (Addendum)
Cialis if needed.  Cut losartan hydrochlorothiazide in half and update me about your BP and situation in about 2 weeks.  Take care.  Glad to see you. Thanks for your effort.   You should get a call about seeing pulmonary.

## 2023-03-23 NOTE — Progress Notes (Signed)
ED d/w pt.  Down 100 lbs from prior OV.  Not lightheaded.  No CP.  ED d/w pt.  Cialis helped.  No NTG use.    Flu shot today.   He needs to get set back up with pulmonary re: OSA.  He doesn't feel different with/without use of CPAP now.  He is still compliant, discussed that he may need different settings vs may be able to stop use depending on his situation.    Meds, vitals, and allergies reviewed.   ROS: Per HPI unless specifically indicated in ROS section   GEN: nad, alert and oriented HEENT: ncat NECK: supple w/o LA CV: rrr.  PULM: ctab, no inc wob ABD: soft, +bs EXT: no edema SKIN: Well-perfused.

## 2023-03-26 DIAGNOSIS — N529 Male erectile dysfunction, unspecified: Secondary | ICD-10-CM | POA: Insufficient documentation

## 2023-03-26 NOTE — Assessment & Plan Note (Signed)
Refer back to pulmonary.

## 2023-03-26 NOTE — Assessment & Plan Note (Signed)
Could be related to primary erectile dysfunction but it could be due to overtreatment with blood pressure medication.  Discussed.  He can use Cialis if needed.  Reasonable to cut losartan hydrochlorothiazide in half and update me about his BP and situation in about 2 weeks.

## 2023-03-26 NOTE — Assessment & Plan Note (Signed)
Reasonable to cut losartan hydrochlorothiazide in half and update me about his BP and situation in about 2 weeks.

## 2023-04-07 ENCOUNTER — Encounter: Payer: Self-pay | Admitting: Internal Medicine

## 2023-04-10 ENCOUNTER — Ambulatory Visit: Payer: 59 | Admitting: Family Medicine

## 2023-04-19 ENCOUNTER — Other Ambulatory Visit (INDEPENDENT_AMBULATORY_CARE_PROVIDER_SITE_OTHER): Payer: Self-pay | Admitting: Family Medicine

## 2023-04-19 DIAGNOSIS — R7303 Prediabetes: Secondary | ICD-10-CM

## 2023-04-19 DIAGNOSIS — I10 Essential (primary) hypertension: Secondary | ICD-10-CM

## 2023-04-25 ENCOUNTER — Ambulatory Visit (INDEPENDENT_AMBULATORY_CARE_PROVIDER_SITE_OTHER): Payer: 59 | Admitting: Family Medicine

## 2023-04-25 ENCOUNTER — Encounter (INDEPENDENT_AMBULATORY_CARE_PROVIDER_SITE_OTHER): Payer: Self-pay | Admitting: Family Medicine

## 2023-04-25 VITALS — BP 128/80 | HR 83 | Temp 97.9°F | Ht 74.0 in | Wt 281.0 lb

## 2023-04-25 DIAGNOSIS — R7303 Prediabetes: Secondary | ICD-10-CM

## 2023-04-25 DIAGNOSIS — I10 Essential (primary) hypertension: Secondary | ICD-10-CM | POA: Diagnosis not present

## 2023-04-25 DIAGNOSIS — E559 Vitamin D deficiency, unspecified: Secondary | ICD-10-CM | POA: Diagnosis not present

## 2023-04-25 DIAGNOSIS — K5900 Constipation, unspecified: Secondary | ICD-10-CM

## 2023-04-25 DIAGNOSIS — Z6836 Body mass index (BMI) 36.0-36.9, adult: Secondary | ICD-10-CM

## 2023-04-25 MED ORDER — VITAMIN D (ERGOCALCIFEROL) 1.25 MG (50000 UNIT) PO CAPS
50000.0000 [IU] | ORAL_CAPSULE | ORAL | 0 refills | Status: DC
Start: 2023-04-25 — End: 2023-07-31

## 2023-04-25 MED ORDER — FISH OIL 1200 MG PO CAPS: ORAL_CAPSULE | ORAL | Status: AC

## 2023-04-25 MED ORDER — POLYETHYLENE GLYCOL 3350 17 GM/SCOOP PO POWD
17.0000 g | Freq: Two times a day (BID) | ORAL | 1 refills | Status: DC | PRN
Start: 2023-04-25 — End: 2023-07-31

## 2023-04-25 MED ORDER — METFORMIN HCL 500 MG PO TABS
500.0000 mg | ORAL_TABLET | Freq: Two times a day (BID) | ORAL | 1 refills | Status: DC
Start: 2023-04-25 — End: 2023-07-13

## 2023-04-25 NOTE — Progress Notes (Signed)
Troy Peterson, D.O.  ABFM, ABOM Specializing in Clinical Bariatric Medicine  Office located at: [address] [place], [place]  [address]     Assessment and Plan:   Medications Discontinued During This Encounter  Medication Reason   metFORMIN (GLUCOPHAGE) 500 MG tablet Reorder   Vitamin D, Ergocalciferol, (DRISDOL) 1.25 MG (50000 UNIT) CAPS capsule Reorder   polyethylene glycol powder (GLYCOLAX/MIRALAX) 17 GM/SCOOP powder Reorder    Meds ordered this encounter  Medications   Vitamin D, Ergocalciferol, (DRISDOL) 1.25 MG (50000 UNIT) CAPS capsule    Sig: Take 1 capsule (50,000 Units total) by mouth every 7 (seven) days.    Dispense:  12 capsule    Refill:  0   metFORMIN (GLUCOPHAGE) 500 MG tablet    Sig: Take 1 tablet (500 mg total) by mouth 2 (two) times daily with a meal.    Dispense:  60 tablet    Refill:  1   polyethylene glycol powder (GLYCOLAX/MIRALAX) 17 GM/SCOOP powder    Sig: Take 17 g by mouth 2 (two) times daily as needed.    Dispense:  850 g    Refill:  1   Omega-3 Fatty Acids (FISH OIL) 1200 MG CAPS    Sig: Take 3 tabs daily    Essential hypertension Assessment & Plan: BP Readings from Last 3 Encounters:  04/25/23 128/80  03/23/23 114/74  02/23/23 112/72   Troy Peterson's BP is at goal today at 128/80. Well controlled. He is taking a half tab of Hyzaar 110-12.5 mg once daily. Pt tolerating well with no adverse side effects. He reports occasional lightheadedness lasting a few seconds after bending over or standing up too quickly. Has not been checking his BP at home. Denies any dizziness, chest pain, or heart palpitations currently.   Ambulatory blood pressure monitoring encouraged. Reminded patient that if they ever feel poorly in any way, to check their blood pressure and pulse as well. I also recommend he cut his antihypertensives in half and start monitoring his BP at home if his BP is below 100s/70s. Continue with current medication regimen as directed  by his PCP. We will continue to monitor his symptoms as they relate to his weight loss journey.    Vitamin D deficiency Assessment & Plan: Lab Results  Component Value Date   VD25OH 54.3 02/23/2023   VD25OH 48.8 09/14/2022   VD25OH 29.9 (L) 12/20/2021   Vit D is well controlled. Managing his condition with ERGO 50K units once weekly. Ideal vitamin D levels of 50-80 reviewed with patient. Briefly educated pt on fat soluble vitamins and their importance to weight loss. Continue to monitor levels regularly (on average 3-4 mo between labs) to keep levels within normal limits and prevent over supplementation.    Pre-diabetes Assessment & Plan: Lab Results  Component Value Date   HGBA1C 5.5 02/23/2023   HGBA1C 5.5 09/14/2022   HGBA1C 5.6 12/20/2021   INSULIN 11.1 02/23/2023   INSULIN 7.2 09/14/2022   INSULIN 7.2 12/20/2021   Last A1C reviewed and within normal range. Condition is well controlled. Pt taking Metformin 500 mg BID with a  meal. Endorses hunger when coming home from work and occasional cravings for off-plan foods. He has started to eat almonds and yogurt around 2pm to satisfy his hunger until dinnertime. Pt also reports being hungry this morning.   Discussed plate portioning - half of plate needs to be lean proteins while the rest of the plate is comprised of complex carbs and vegetables.  Advised patient to increase fiber intake and avoid low glycemic vegetables. Troy Peterson will continue to work on weight loss, exercise, via their meal plan we devised to help decrease the risk of progressing to diabetes.    Constipation, unspecified constipation type Assessment & Plan: Condition is well controlled. Troy Peterson is taking Miralax once daily in the morning. Tolerating well with no negative side effects. He reports having regular bowel movements. No new concerns at this time. Continue on current regimen. We will continue to monitor his condition as it pertains to his weight loss journey.     Morbid obesity (HCC)-start bmi 47.25 BMI 36.0-36.9,adult-current bmi 36.06 Assessment & Plan: Troy Peterson is here to discuss his progress with his obesity treatment plan along with follow-up of his obesity related diagnoses. See Medical Weight Management Flowsheet for complete bioelectrical impedance results.  As a result of exercise and maintaining a healthy diet, he reports his clothes have been fitting him better and are a bit looser. He reports being "down a belt loop". He has increased weights in his exercise routine and started running about 2 weeks. He alternates between running and walking, detailing that he runs until he can't anymore and walks until he feels he can run again. He has been discouraged due to feeling he has reached a plateau with his weight in the 270-280 range. Extensive discussion with patient on modifying his exercise and continuing to increase weights. Reviewed the recommendation of moderate-intensity activity to 150 minutes a week. I recommend drastically changing the intensity of his exercise routine. We discussed starting to journal his daily caloric and protein intake to help with mindful eating. Pt verbalized understanding and agreed.  Since last office visit on 02/23/23 patient's muscle mass has increased by 4.4 lbs. Fat mass has decreased by 3.6 lbs. Total body water has increased by 4.8 lbs.  Counseling done on how various foods will affect these numbers and how to maximize success  Total lbs lost to date: 72 lbs Total weight loss percentage to date: -20.40%  Journal adhering to recommended goals of 1500-1600 calories a day with 100+ grams of protein daily.   Behavioral Intervention Additional resources provided today: Healthy plate with healthy sources of protein and carbs  Evidence-based interventions for health behavior change were utilized today including the discussion of self monitoring techniques, problem-solving barriers and SMART goal setting  techniques.   Regarding patient's less desirable eating habits and patterns, we employed the technique of small changes.  Pt will specifically work on: Increasing weight lifting to at least 5 days a week and continue HIIT training for next visit.    FOLLOW UP: Return in about 1 month (around 05/26/2023). He was informed of the importance of frequent follow up visits to maximize his success with intensive lifestyle modifications for his multiple health conditions.  Subjective:   Chief complaint: Obesity Leopold is here to discuss his progress with his obesity treatment plan. He is keeping a food journal and adhering to recommended goals of 1900-2000 calories and 155+ grams of protein and states he is following his eating plan approximately 97% of the time. He states he is walking 30 minutes 5 days per week and lifting weights 30-35 minutes 3 days per week (MWF).  Interval History:  SATHVIK TIEDT is here for a follow up office visit. Since last OV, he has been doing overall well. He is eating 150-200 grams of protein daily. Overall he tends to eat the same things for breakfast, lunch, and snacks  every day and almost always has the same thing for dinner as well. He endorses cravings for foods such as pizzas. Occasionally, he makes flatbread pizzas at home and sometimes uses a tortilla with 6g of protein. He enjoys fish, but tends not to eat it often since his wife doesn't. Daily water intake ranges from 128-144 fl oz of water.  Pharmacotherapy for weight loss: He is currently taking  Metformin  for medical weight loss.  Denies side effects.    Review of Systems:  Pertinent positives were addressed with patient today.  Reviewed by clinician on day of visit: allergies, medications, problem list, medical history, surgical history, family history, social history, and previous encounter notes.  Weight Summary and Biometrics   Weight Lost Since Last Visit: 0lb  Weight Gained Since Last Visit:  1lb   Vitals Temp: 97.9 F (36.6 C) BP: 128/80 Pulse Rate: 83 SpO2: 96 %   Anthropometric Measurements Height: 6\' 2"  (1.88 m) Weight: 281 lb (127.5 kg) BMI (Calculated): 36.06 Weight at Last Visit: 280lb Weight Lost Since Last Visit: 0lb Weight Gained Since Last Visit: 1lb Starting Weight: 353lb Total Weight Loss (lbs): 72 lb (32.7 kg)   Body Composition  Body Fat %: 31.7 % Fat Mass (lbs): 89 lbs Muscle Mass (lbs): 182.6 lbs Total Body Water (lbs): 137.6 lbs Visceral Fat Rating : 16   Other Clinical Data Fasting: no Labs: no Today's Visit #: 32 Starting Date: 11/07/20    Objective:   PHYSICAL EXAM: Blood pressure 128/80, pulse 83, temperature 97.9 F (36.6 C), height 6\' 2"  (1.88 m), weight 281 lb (127.5 kg), SpO2 96%. Body mass index is 36.08 kg/m.  General: Well Developed, well nourished, and in no acute distress.  HEENT: Normocephalic, atraumatic Skin: Warm and dry, cap RF less 2 sec, good turgor Chest:  Normal excursion, shape, no gross abn Respiratory: speaking in full sentences, no conversational dyspnea NeuroM-Sk: Ambulates w/o assistance, moves * 4 Psych: A and O *3, insight good, mood-full  DIAGNOSTIC DATA REVIEWED:  BMET    Component Value Date/Time   NA 139 02/23/2023 0911   K 4.4 02/23/2023 0911   CL 101 02/23/2023 0911   CO2 26 02/23/2023 0911   GLUCOSE 91 02/23/2023 0911   GLUCOSE 107 (H) 07/30/2020 1700   BUN 19 02/23/2023 0911   CREATININE 0.83 02/23/2023 0911   CALCIUM 9.3 02/23/2023 0911   GFRNONAA >60 02/08/2008 0445   GFRAA  02/08/2008 0445    >60        The eGFR has been calculated using the MDRD equation. This calculation has not been validated in all clinical   Lab Results  Component Value Date   HGBA1C 5.5 02/23/2023   HGBA1C 6.0 (H) 10/28/2020   Lab Results  Component Value Date   INSULIN 11.1 02/23/2023   INSULIN 17.7 10/28/2020   Lab Results  Component Value Date   TSH 1.100 09/14/2022   CBC     Component Value Date/Time   WBC 11.0 (H) 07/30/2020 1700   RBC 4.97 07/30/2020 1700   HGB 13.7 07/30/2020 1700   HCT 41.4 07/30/2020 1700   PLT 256.0 07/30/2020 1700   MCV 83.3 07/30/2020 1700   MCHC 33.1 07/30/2020 1700   RDW 13.9 07/30/2020 1700   Iron Studies No results found for: "IRON", "TIBC", "FERRITIN", "IRONPCTSAT" Lipid Panel     Component Value Date/Time   CHOL 137 02/23/2023 0911   TRIG 97 02/23/2023 0911   HDL 42 02/23/2023 0911   CHOLHDL  3.3 02/23/2023 0911   CHOLHDL 4 07/30/2020 1700   VLDL 16.6 07/30/2020 1700   LDLCALC 77 02/23/2023 0911   LDLDIRECT 102.0 07/25/2016 1600   Hepatic Function Panel     Component Value Date/Time   PROT 7.3 09/14/2022 1659   ALBUMIN 4.4 09/14/2022 1659   AST 21 09/14/2022 1659   ALT 29 09/14/2022 1659   ALKPHOS 82 09/14/2022 1659   BILITOT 0.8 09/14/2022 1659      Component Value Date/Time   TSH 1.100 09/14/2022 1659   Nutritional Lab Results  Component Value Date   VD25OH 54.3 02/23/2023   VD25OH 48.8 09/14/2022   VD25OH 29.9 (L) 12/20/2021    Attestations:   Reviewed by clinician on day of visit: allergies, medications, problem list, medical history, surgical history, family history, social history, and previous encounter notes pertinent to patient's obesity diagnosis.    This encounter took 51 total minutes of time including time coordinating the above treatment plan, any pre-visit chart review and post-visit documentation and reviewing any labs and/or imaging, reviewing pertinent prior notes, and providing counseling the patient about such treatment plan.  Approximately 50% of this time was spent in direct, face-to-face counseling and coordination of care.   I, Isabelle Course, acting as a medical scribe for Thomasene Lot, DO., have compiled all relevant documentation for today's office visit on behalf of Thomasene Lot, DO, while in the presence of [employer], DO.  I have reviewed the above documentation  for accuracy and completeness, and I agree with the above. Troy Peterson, D.O.  The 21st Century Cures Act was signed into law in 2016 which includes the topic of electronic health records.  This provides immediate access to information in MyChart.  This includes consultation notes, operative notes, office notes, lab results and pathology reports.  If you have any questions about what you read please let us know at your next visit so we can discuss your concerns and take corrective action if need be.  We are right here with you.

## 2023-05-23 ENCOUNTER — Ambulatory Visit (INDEPENDENT_AMBULATORY_CARE_PROVIDER_SITE_OTHER): Payer: 59 | Admitting: Family Medicine

## 2023-05-23 ENCOUNTER — Encounter (INDEPENDENT_AMBULATORY_CARE_PROVIDER_SITE_OTHER): Payer: Self-pay | Admitting: Family Medicine

## 2023-05-23 VITALS — BP 111/76 | HR 56 | Temp 97.9°F | Ht 74.0 in | Wt 278.0 lb

## 2023-05-23 DIAGNOSIS — R7303 Prediabetes: Secondary | ICD-10-CM | POA: Diagnosis not present

## 2023-05-23 DIAGNOSIS — I10 Essential (primary) hypertension: Secondary | ICD-10-CM | POA: Diagnosis not present

## 2023-05-23 DIAGNOSIS — E559 Vitamin D deficiency, unspecified: Secondary | ICD-10-CM

## 2023-05-23 DIAGNOSIS — Z6836 Body mass index (BMI) 36.0-36.9, adult: Secondary | ICD-10-CM

## 2023-05-23 DIAGNOSIS — Z6835 Body mass index (BMI) 35.0-35.9, adult: Secondary | ICD-10-CM

## 2023-05-23 NOTE — Progress Notes (Signed)
Troy Peterson, D.O.  ABFM, ABOM Specializing in Clinical Bariatric Medicine  Office located at: [address] [place], [place]  [address]   Assessment and Plan:   FOR THE DISEASE OF OBESITY: BMI 36.0-36.9,adult-current bmi 35.68 Morbid obesity (HCC)-start bmi 47.25 Assessment & Plan: Since last office visit on 04/25/23 patient's muscle mass has decreased by 5.2 lb. Fat mass has decreased by 2.6 lb. Total body water has decreased by 0.6 lb.  Counseling done on how various foods will affect these numbers and how to maximize success  Total lbs lost to date: 75 lbs  Total weight loss percentage to date: 21.25%    Recommended Dietary Goals [name] is currently in the action stage of change. As such, his goal is to continue weight management plan.  Meal Plan:  Pt instructed to increase to 1700 calories and 150++ grams protein for 2 weeks and then to 1900 calories for 2 weeks.     *** Refeeding concepts and metabolic adaption counseling performed with pt today.   Explained to pt that metabolic adaptation can make it more difficult to lose weight and can contribute to weight regain. It can also delay the time it takes to reach weight loss goals. However, metabolic adaptation doesn't seem to be a risk factor for weight regain.  Some strategies to help with metabolic adaptation include: Increasing protein needs, Eating high-fiber foods, Programming-controlled diet refeeds, Taking diet breaks during a large weight loss phase, and Increasing physical activity.  It can take several weeks to months to notice significant changes in metabolic adaptation   Behavioral Intervention We discussed the following today: continue to work on maintaining a reduced calorie state, getting the recommended amount of protein, incorporating whole foods, making healthy choices, staying well hydrated and practicing mindfulness when eating.  Additional resources provided today: None  Evidence-based  interventions for health behavior change were utilized today including the discussion of self monitoring techniques, problem-solving barriers and SMART goal setting techniques.   Regarding patient's less desirable eating habits and patterns, we employed the technique of small changes.   Pt will specifically work on: n/a   Recommended Physical Activity Goals [name] has been advised to work up to 150 minutes of moderate intensity aerobic activity a week and strengthening exercises 2-3 times per week for cardiovascular health, weight loss maintenance and preservation of muscle mass.   He has agreed to : Continue current level of physical activity    Pharmacotherapy We both agreed to : continue current anti-obesity medication regimen   FOR ASSOCIATED CONDITIONS ADDRESSED TODAY:  Pre-diabetes Assessment & Plan: Most recent hemoglobin A1c and fasting insulin: Lab Results  Component Value Date   HGBA1C 5.5 02/23/2023   HGBA1C 5.5 09/14/2022   HGBA1C 5.6 12/20/2021   INSULIN 11.1 02/23/2023   INSULIN 7.2 09/14/2022   INSULIN 7.2 12/20/2021    He is currently on Metformin 500 mg bid. He is tolerating medication well, denies any GI upset. His hunger and cravings are well controlled. Maintain with medication at current dose. Continue with weight loss therapy.   Essential hypertension Assessment & Plan: Last 3 blood pressure readings in our office are as follows: BP Readings from Last 3 Encounters:  05/23/23 111/76  04/25/23 128/80  03/23/23 114/74   HTN treated with Losartan-Hydrochlorothiazide 100-12.5 mg daily one full tablet. Pt recently advised by PCP to cut blood pressure medication in half. He has not made this change yet. His blood pressure is stable today. It is also well  controlled at home - 110-115/70s.   We recommend pt follow the instructions of his PCP as it relates to taking the antihypertensive.  Continue with Prudent nutritional plan and low sodium diet, advance  exercise as tolerated.     Vitamin D deficiency Assessment & Plan: Most recent vitamin D:  Lab Results  Component Value Date   VD25OH 54.3 02/23/2023   VD25OH 48.8 09/14/2022   VD25OH 29.9 (L) 12/20/2021   He is currently on ERGO 50,000 units once a week without any adverse effects. Continue with ERGO at current dose and weight loss efforts. Vitamin D; future.    Follow up:   Return 06/22/23. He was informed of the importance of frequent follow up visits to maximize his success with intensive lifestyle modifications for his multiple health conditions.  Subjective:   Chief complaint: Obesity Troy Peterson is here to discuss his progress with his obesity treatment plan. He is  keeping a food journal and adhering to recommended goals of 1900-2000 calories and 155+ grams of protein and states he is following his eating plan approximately 97% of the time. He states he is walking/running 30-35 minutes, 5 days a week and lifting weights 4-sets, 4-5 days a week.   Interval History:  Troy Peterson is here for a follow up office visit. Since last OV, Mr.Mehlhoff is down 3 lbs. He endorses his biggest challenge is some of the food choices he is making on the weekends, for example having a high calorie trail mix. On average, he has 1500 calories per day and 150+ grams protein. His hunger and cravings are well controlled.   Pharmacotherapy for weight loss: He is currently taking  Metformin 500 mg bid  Review of Systems:  Pertinent positives were addressed with patient today.  Reviewed by clinician on day of visit: allergies, medications, problem list, medical history, surgical history, family history, social history, and previous encounter notes.  Weight Summary and Biometrics   Weight Lost Since Last Visit: 3lb  Weight Gained Since Last Visit: 0lb   Vitals Temp: 97.9 F (36.6 C) BP: 111/76 Pulse Rate: (!) 56 SpO2: 98 %   Anthropometric Measurements Height: 6\' 2"  (1.88 m) Weight: 278 lb  (126.1 kg) BMI (Calculated): 35.68 Weight at Last Visit: 281lb Weight Lost Since Last Visit: 3lb Weight Gained Since Last Visit: 0lb Starting Weight: 353lb Total Weight Loss (lbs): 75 lb (34 kg)   Body Composition  Body Fat %: 32.9 % Fat Mass (lbs): 91.6 lbs Muscle Mass (lbs): 177.4 lbs Total Body Water (lbs): 137 lbs Visceral Fat Rating : 17   Other Clinical Data Fasting: no Labs: no Today's Visit #: 33 Starting Date: 11/07/20   Objective:   PHYSICAL EXAM: Blood pressure 111/76, pulse (!) 56, temperature 97.9 F (36.6 C), height 6\' 2"  (1.88 m), weight 278 lb (126.1 kg), SpO2 98%. Body mass index is 35.69 kg/m.  General: he is overweight, cooperative and in no acute distress. PSYCH: Has normal mood, affect and thought process.   HEENT: EOMI, sclerae are anicteric. Lungs: Normal breathing effort, no conversational dyspnea. Extremities: Moves * 4 Neurologic: A and O * 3, good insight  DIAGNOSTIC DATA REVIEWED: BMET    Component Value Date/Time   NA 139 02/23/2023 0911   K 4.4 02/23/2023 0911   CL 101 02/23/2023 0911   CO2 26 02/23/2023 0911   GLUCOSE 91 02/23/2023 0911   GLUCOSE 107 (H) 07/30/2020 1700   BUN 19 02/23/2023 0911   CREATININE 0.83 02/23/2023 0911  CALCIUM 9.3 02/23/2023 0911   GFRNONAA >60 02/08/2008 0445   GFRAA  02/08/2008 0445    >60        The eGFR has been calculated using the MDRD equation. This calculation has not been validated in all clinical   Lab Results  Component Value Date   HGBA1C 5.5 02/23/2023   HGBA1C 6.0 (H) 10/28/2020   Lab Results  Component Value Date   INSULIN 11.1 02/23/2023   INSULIN 17.7 10/28/2020   Lab Results  Component Value Date   TSH 1.100 09/14/2022   CBC    Component Value Date/Time   WBC 11.0 (H) 07/30/2020 1700   RBC 4.97 07/30/2020 1700   HGB 13.7 07/30/2020 1700   HCT 41.4 07/30/2020 1700   PLT 256.0 07/30/2020 1700   MCV 83.3 07/30/2020 1700   MCHC 33.1 07/30/2020 1700   RDW 13.9  07/30/2020 1700   Iron Studies No results found for: "IRON", "TIBC", "FERRITIN", "IRONPCTSAT" Lipid Panel     Component Value Date/Time   CHOL 137 02/23/2023 0911   TRIG 97 02/23/2023 0911   HDL 42 02/23/2023 0911   CHOLHDL 3.3 02/23/2023 0911   CHOLHDL 4 07/30/2020 1700   VLDL 16.6 07/30/2020 1700   LDLCALC 77 02/23/2023 0911   LDLDIRECT 102.0 07/25/2016 1600   Hepatic Function Panel     Component Value Date/Time   PROT 7.3 09/14/2022 1659   ALBUMIN 4.4 09/14/2022 1659   AST 21 09/14/2022 1659   ALT 29 09/14/2022 1659   ALKPHOS 82 09/14/2022 1659   BILITOT 0.8 09/14/2022 1659      Component Value Date/Time   TSH 1.100 09/14/2022 1659   Nutritional Lab Results  Component Value Date   VD25OH 54.3 02/23/2023   VD25OH 48.8 09/14/2022   VD25OH 29.9 (L) 12/20/2021    Attestations:   I, Special Puri, acting as a Stage manager for Thomasene Lot, DO., have compiled all relevant documentation for today's office visit on behalf of Thomasene Lot, DO, while in the presence of [employer], DO.  Reviewed by clinician on day of visit: allergies, medications, problem list, medical history, surgical history, family history, social history, and previous encounter notes pertinent to patient's obesity diagnosis. I have spent 40  minutes in the care of the patient today including: preparing to see patient (e.g. review and interpretation of tests, old notes ), obtaining and/or reviewing separately obtained history, performing a medically appropriate examination or evaluation, counseling and educating the patient, ordering medications, test or procedures, documenting clinical information in the electronic or other health care record, and independently interpreting results and communicating results to the patient, family, or caregiver   I have reviewed the above documentation for accuracy and completeness, and I agree with the above. Troy Peterson, D.O.  The 21st Century Cures  Act was signed into law in 2016 which includes the topic of electronic health records.  This provides immediate access to information in MyChart.  This includes consultation notes, operative notes, office notes, lab results and pathology reports.  If you have any questions about what you read please let us know at your next visit so we can discuss your concerns and take corrective action if need be.  We are right here with you.

## 2023-05-29 ENCOUNTER — Other Ambulatory Visit: Payer: Self-pay | Admitting: Family Medicine

## 2023-05-29 DIAGNOSIS — I10 Essential (primary) hypertension: Secondary | ICD-10-CM

## 2023-05-30 NOTE — Telephone Encounter (Signed)
[id number] [address] [address], [place] - [phone] [address] Phone: [phone]  Fax: [phone]      [facility], not [facility] [place] [facility]  States he is out of pills

## 2023-05-30 NOTE — Telephone Encounter (Signed)
Last office visit: 03/23/23 Next office visit: nothing scheduled Last refill: 03/23/23 losartan-hydrochlorothiazide (HYZAAR) 100-12.5 MG tablet

## 2023-05-31 MED ORDER — LOSARTAN POTASSIUM-HCTZ 100-12.5 MG PO TABS: 0.5000 | ORAL_TABLET | Freq: Every day | ORAL | 3 refills | Status: DC

## 2023-05-31 NOTE — Telephone Encounter (Signed)
Sent. Thanks.   

## 2023-06-08 ENCOUNTER — Encounter: Payer: Self-pay | Admitting: Internal Medicine

## 2023-06-08 ENCOUNTER — Ambulatory Visit: Payer: 59 | Admitting: Internal Medicine

## 2023-06-08 VITALS — BP 128/82 | HR 60 | Temp 98.0°F | Ht 74.0 in | Wt 282.0 lb

## 2023-06-08 DIAGNOSIS — G4733 Obstructive sleep apnea (adult) (pediatric): Secondary | ICD-10-CM | POA: Diagnosis not present

## 2023-06-08 DIAGNOSIS — G4719 Other hypersomnia: Secondary | ICD-10-CM | POA: Diagnosis not present

## 2023-06-08 NOTE — Progress Notes (Signed)
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  Encouraged proper weight management.  Discussed driving precautions and its relationship with hypersomnolence.  Discussed operating dangerous equipment and its relationship with hypersomnolence.  Discussed sleep hygiene, and benefits of a fixed sleep waked time.  The importance of getting eight or more hours of sleep discussed with patient.  Discussed limiting the use of the computer and television before bedtime.  Decrease naps during the day, so night time sleep will become enhanced.  Limit caffeine, and sleep deprivation.  HTN, stroke, and heart failure are potential risk factors.    EPWORTH SLEEP SCORE 2 Current weight is 282 pounds   PAST MEDICAL HISTORY :   has a past medical history of Hypertension and OSA (obstructive sleep apnea) (01/09/2018).  has a past surgical history that includes Hip surgery (6th grade). Prior to Admission medications   Medication Sig Start Date End Date Taking? Authorizing Provider  losartan-hydrochlorothiazide (HYZAAR) 100-12.5 MG tablet Take 0.5 tablets by mouth daily. 05/31/23   Joaquim Nam, MD  metFORMIN (GLUCOPHAGE) 500 MG tablet Take 1 tablet (500 mg total) by mouth 2 (two) times daily with a meal. 04/25/23 06/24/23  Opalski, Gavin Pound, DO  Omega-3 Fatty Acids (FISH OIL) 1200 MG CAPS Take 3 tabs daily  04/25/23   Opalski, Gavin Pound, DO  polyethylene glycol powder (GLYCOLAX/MIRALAX) 17 GM/SCOOP powder Take 17 g by mouth 2 (two) times daily as needed. 04/25/23   Opalski, Gavin Pound, DO  tadalafil (CIALIS) 20 MG tablet Take 0.5-1 tablets (10-20 mg total) by mouth every other day as needed for erectile dysfunction. 03/23/23   Joaquim Nam, MD  Vitamin D, Ergocalciferol, (DRISDOL) 1.25 MG (50000 UNIT) CAPS capsule Take 1 capsule (50,000 Units total) by mouth every 7 (seven) days. 04/25/23   Opalski, Gavin Pound, DO   No Known Allergies  FAMILY HISTORY:  family history includes Cancer in his mother; High blood pressure in his mother; Stroke in his mother. SOCIAL HISTORY:  reports that he has quit smoking. He has never used smokeless tobacco. He reports that he does not drink alcohol and does not use drugs.   Review of Systems:  Gen:  Denies  fever, sweats, chills weight loss  HEENT: Denies blurred vision, double vision, ear pain, eye pain, hearing loss, nose bleeds, sore throat Cardiac:  No dizziness, chest pain or heaviness, chest tightness,edema, No JVD Resp:   No cough, -sputum production, -shortness of breath,-wheezing, -hemoptysis,  Gi: Denies swallowing difficulty, stomach pain, nausea or vomiting, diarrhea, constipation, bowel incontinence Gu:  Denies bladder incontinence, burning urine Ext:   Denies Joint pain, stiffness or swelling Skin: Denies  skin rash, easy bruising or bleeding or hives Endoc:  Denies polyuria, polydipsia , polyphagia or weight change Psych:   Denies depression, insomnia or hallucinations  Other:  All other systems negative   ALL OTHER ROS ARE NEGATIVE  BP 128/82 (BP  Location: Left Arm, Patient Position: Sitting, Cuff Size: Large)   Pulse 60   Temp 98 F (36.7 C) (Temporal)   Ht 6\' 2"  (1.88 m)   Wt 282 lb (127.9 kg)   SpO2 98%   BMI 36.21 kg/m     Physical Examination:   General Appearance: No distress  EYES PERRLA, EOM intact.   NECK Supple, No  JVD Pulmonary: normal breath sounds, No wheezing.  CardiovascularNormal S1,S2.  No m/r/g.   Abdomen: Benign, Soft, non-tender. Skin:   warm, no rashes, no ecchymosis  Extremities: normal, no cyanosis, clubbing. Neuro:without focal findings,  speech normal  PSYCHIATRIC: Mood, affect within normal limits.   ALL OTHER ROS ARE NEGATIVE    ASSESSMENT AND PLAN SYNOPSIS  42 year old pleasant white male seen today for reassessment of his underlying sleep apnea 2019 patient was diagnosed with mild OSA when he weighed 340 pounds Currently he weighs 275 pounds and symptoms of sleep apnea have dissipated with time and weight loss Patient does work for the [employer] therefore will need a repeat and reassessment of of sleep apnea with home sleep study  Recommend repeat home sleep study  Obesity -recommend significant weight loss -recommend changing diet Weight goal is 250 pounds -Recommend increased daily activity and exercise   MEDICATION ADJUSTMENTS/LABS AND TESTS ORDERED: Recommend Sleep Study  CURRENT MEDICATIONS REVIEWED AT LENGTH WITH PATIENT TODAY   Patient  satisfied with Plan of action and management. All questions answered  Follow up  3 months  Total Time Spent  45 mins   Wallis Bamberg Santiago Glad, M.D.  [facility]  Medical Director [facility] Medical Director [facility] Cardio-Pulmonary Department

## 2023-06-08 NOTE — Patient Instructions (Signed)
Recommend home sleep test to -re-assess diagnosis of sleep apnea  Avoid secondhand smoke Avoid SICK contacts Recommend  Masking  when appropriate Recommend Keep up-to-date with vaccinations

## 2023-06-22 ENCOUNTER — Ambulatory Visit (INDEPENDENT_AMBULATORY_CARE_PROVIDER_SITE_OTHER): Payer: 59 | Admitting: Family Medicine

## 2023-06-22 ENCOUNTER — Encounter (INDEPENDENT_AMBULATORY_CARE_PROVIDER_SITE_OTHER): Payer: Self-pay | Admitting: Family Medicine

## 2023-06-22 VITALS — BP 134/76 | HR 53 | Temp 97.9°F | Ht 74.0 in | Wt 279.0 lb

## 2023-06-22 DIAGNOSIS — R7303 Prediabetes: Secondary | ICD-10-CM | POA: Diagnosis not present

## 2023-06-22 DIAGNOSIS — I10 Essential (primary) hypertension: Secondary | ICD-10-CM | POA: Diagnosis not present

## 2023-06-22 DIAGNOSIS — Z6835 Body mass index (BMI) 35.0-35.9, adult: Secondary | ICD-10-CM

## 2023-06-22 DIAGNOSIS — E669 Obesity, unspecified: Secondary | ICD-10-CM

## 2023-06-22 MED ORDER — LOSARTAN POTASSIUM-HCTZ 100-12.5 MG PO TABS: 0.5000 | ORAL_TABLET | Freq: Every day | ORAL | 0 refills | Status: DC

## 2023-06-22 NOTE — Progress Notes (Signed)
.smr  Office: [phone]  /  Fax: [phone]  WEIGHT SUMMARY AND BIOMETRICS  Anthropometric Measurements Height: 6\' 2"  (1.88 m) Weight: 279 lb (126.6 kg) BMI (Calculated): 35.81 Weight at Last Visit: 278 lb Weight Lost Since Last Visit: 0 Weight Gained Since Last Visit: 1 lb Starting Weight: 353 lb Total Weight Loss (lbs): 74 lb (33.6 kg)   Body Composition  Body Fat %: 32.7 % Fat Mass (lbs): 91.4 lbs Muscle Mass (lbs): 179.2 lbs Total Body Water (lbs): 139.6 lbs Visceral Fat Rating : 17   Other Clinical Data Fasting: No Labs: No Today's Visit #: 34 Starting Date: 11/07/20    Chief Complaint: OBESITY   History of Present Illness   The patient, with a history of hypertension, prediabetes, and obesity, presents for a routine follow-up. His blood pressure is well-controlled on Losartan-Hydrochlorothiazide 100/12.5 mg. He has been focusing on diet, exercise, and weight loss to manage his conditions. Over the past month, which included the holiday season, he gained one pound. He has been journaling his food intake, aiming for a daily goal of 1900 calories and at least 150 grams of protein, which he reports meeting about 90% of the time. He engages in physical activity, including walking, running, and weightlifting, for 30-60 minutes five times per week.  The patient reports a lack of hunger, even to the point of feeling sick when eating. This has been occurring since the previous weekend. Despite this, he continues to consume his meals, viewing food as his "real medicine." He has been experimenting with his diet, adjusting his caloric intake, and trying new recipes to improve his health. He has also been working on improving his water intake, particularly on weekends.  Previously, the patient had a tendency to eat minimally during the day and more at night, a pattern often seen in prediabetes. However, he has made changes to this pattern, and his most recent A1C was  well-controlled at 5.5. He continues to monitor his health and make adjustments as needed to manage his conditions.          PHYSICAL EXAM:  Blood pressure 134/76, pulse (!) 53, temperature 97.9 F (36.6 C), height 6\' 2"  (1.88 m), weight 279 lb (126.6 kg), SpO2 98%. Body mass index is 35.82 kg/m.  DIAGNOSTIC DATA REVIEWED:  BMET    Component Value Date/Time   NA 139 02/23/2023 0911   K 4.4 02/23/2023 0911   CL 101 02/23/2023 0911   CO2 26 02/23/2023 0911   GLUCOSE 91 02/23/2023 0911   GLUCOSE 107 (H) 07/30/2020 1700   BUN 19 02/23/2023 0911   CREATININE 0.83 02/23/2023 0911   CALCIUM 9.3 02/23/2023 0911   GFRNONAA >60 02/08/2008 0445   GFRAA  02/08/2008 0445    >60        The eGFR has been calculated using the MDRD equation. This calculation has not been validated in all clinical   Lab Results  Component Value Date   HGBA1C 5.5 02/23/2023   HGBA1C 6.0 (H) 10/28/2020   Lab Results  Component Value Date   INSULIN 11.1 02/23/2023   INSULIN 17.7 10/28/2020   Lab Results  Component Value Date   TSH 1.100 09/14/2022   CBC    Component Value Date/Time   WBC 11.0 (H) 07/30/2020 1700   RBC 4.97 07/30/2020 1700   HGB 13.7 07/30/2020 1700   HCT 41.4 07/30/2020 1700   PLT 256.0 07/30/2020 1700   MCV 83.3 07/30/2020 1700   MCHC 33.1 07/30/2020 1700  RDW 13.9 07/30/2020 1700   Iron Studies No results found for: "IRON", "TIBC", "FERRITIN", "IRONPCTSAT" Lipid Panel     Component Value Date/Time   CHOL 137 02/23/2023 0911   TRIG 97 02/23/2023 0911   HDL 42 02/23/2023 0911   CHOLHDL 3.3 02/23/2023 0911   CHOLHDL 4 07/30/2020 1700   VLDL 16.6 07/30/2020 1700   LDLCALC 77 02/23/2023 0911   LDLDIRECT 102.0 07/25/2016 1600   Hepatic Function Panel     Component Value Date/Time   PROT 7.3 09/14/2022 1659   ALBUMIN 4.4 09/14/2022 1659   AST 21 09/14/2022 1659   ALT 29 09/14/2022 1659   ALKPHOS 82 09/14/2022 1659   BILITOT 0.8 09/14/2022 1659       Component Value Date/Time   TSH 1.100 09/14/2022 1659   Nutritional Lab Results  Component Value Date   VD25OH 54.3 02/23/2023   VD25OH 48.8 09/14/2022   VD25OH 29.9 (L) 12/20/2021     Assessment and Plan    Hypertension Hypertension is well-controlled with a blood pressure of 134/76 mmHg on losartan hydrochlorothiazide 100/12.5 mg. He is managing his condition with diet and exercise. - Refill losartan hydrochlorothiazide 100/12.5 mg  Prediabetes Prediabetes is well-managed with an A1c of 5.5%. He maintains a diet of 1900 calories and 150 grams of protein daily, and exercises 30-60 minutes five times per week. Discussed the importance of journaling for meeting dietary goals. - Continue current diet and exercise regimen - Encourage journaling of food intake - Monitor A1c levels  Obesity He has gained one pound over the last month, attributed to the holiday season. He is experimenting with calorie goals and journaling food intake. Discussed balancing calorie intake to avoid metabolic slowdown and the benefits of protein intake for regular exercisers. Provided recipes for white chicken chili and skinny chicken enchiladas. - Continue current diet and exercise regimen - Provide recipes for white chicken chili and skinny chicken enchiladas - Encourage finding lower-calorie alternatives for weekend meals - Monitor weight and adjust calorie goals as needed  General Health Maintenance He struggles with maintaining his diet on weekends. Discussed strategies for managing weekend meals and the importance of consistency. - Encourage consistency in diet and exercise - Provide anticipatory guidance on managing weekend meals - Schedule follow-up appointment for February with fasting for metabolism test and labs  Follow-up - Schedule follow-up appointment for February with fasting for metabolism test and labs.       He was informed of the importance of frequent follow up visits to maximize  his success with intensive lifestyle modifications for his multiple health conditions.    Quillian Quince, MD

## 2023-07-09 ENCOUNTER — Encounter: Payer: 59 | Admitting: Pulmonary Disease

## 2023-07-09 DIAGNOSIS — G4733 Obstructive sleep apnea (adult) (pediatric): Secondary | ICD-10-CM

## 2023-07-09 DIAGNOSIS — G4719 Other hypersomnia: Secondary | ICD-10-CM

## 2023-07-13 ENCOUNTER — Ambulatory Visit (INDEPENDENT_AMBULATORY_CARE_PROVIDER_SITE_OTHER): Payer: 59 | Admitting: Internal Medicine

## 2023-07-13 VITALS — BP 120/69 | HR 63 | Temp 98.2°F | Ht 74.0 in | Wt 276.0 lb

## 2023-07-13 DIAGNOSIS — Z6835 Body mass index (BMI) 35.0-35.9, adult: Secondary | ICD-10-CM | POA: Diagnosis not present

## 2023-07-13 DIAGNOSIS — E66812 Obesity, class 2: Secondary | ICD-10-CM | POA: Diagnosis not present

## 2023-07-13 DIAGNOSIS — R7303 Prediabetes: Secondary | ICD-10-CM | POA: Diagnosis not present

## 2023-07-13 DIAGNOSIS — I10 Essential (primary) hypertension: Secondary | ICD-10-CM

## 2023-07-13 MED ORDER — METFORMIN HCL 500 MG PO TABS: 500.0000 mg | ORAL_TABLET | Freq: Two times a day (BID) | ORAL | 1 refills | Status: DC

## 2023-07-13 MED ORDER — LOSARTAN POTASSIUM-HCTZ 50-12.5 MG PO TABS: 1.0000 | ORAL_TABLET | Freq: Every day | ORAL | 0 refills | Status: DC

## 2023-07-13 NOTE — Assessment & Plan Note (Signed)
Presents for medical weight management. Experienced a plateau in weight loss despite previous efforts. Recent adjustments to caloric intake led to a resumption of weight loss. Advised to distribute caloric intake more evenly throughout the day to avoid excessive evening consumption. Emphasis on achieving at least 40 grams of protein per meal to boost metabolism and promote muscle building. Discussed the physiological basis of hunger and metabolic adaptation to caloric restriction. - Distribute caloric intake evenly throughout the day - Ensure at least 40 grams of protein per meal - Continue current dietary adjustments and monitor weight

## 2023-07-13 NOTE — Assessment & Plan Note (Signed)
Most recent A1c is  Lab Results  Component Value Date   HGBA1C 5.5 02/23/2023   HGBA1C 6.0 (H) 10/28/2020   And improved from 6.0.  Patient aware of disease state and risk of progression. This may contribute to abnormal cravings, fatigue and diabetic complications without having diabetes.    Advised to maintain a diet low on simple and processed carbohydrates.  Is currently on metformin 500 mg 1 tablet twice daily for pharmacoprophylaxis.  Continue current regimen

## 2023-07-13 NOTE — Assessment & Plan Note (Signed)
Blood pressure is well-controlled.  Patient has been cutting his losartan in half and experiencing some side effects likely due to medication being time-released and disruption of pharmacokinetics.  As a courtesy I provided him with a one-time refill of losartan 50 mg/hydrochlorothiazide 12.5 mg once a day.

## 2023-07-13 NOTE — Progress Notes (Signed)
Office: [phone]  /  Fax: [phone]  Weight Summary And Biometrics  Vitals Temp: 98.2 F (36.8 C) BP: 120/69 Pulse Rate: 63 SpO2: 96 %   Anthropometric Measurements Height: 6\' 2"  (1.88 m) Weight: 276 lb (125.2 kg) BMI (Calculated): 35.42 Weight at Last Visit: 279 lb Weight Lost Since Last Visit: 3 lb Weight Gained Since Last Visit: 0 lb Starting Weight: 353 lb   Body Composition  Body Fat %: 33.2 % Fat Mass (lbs): 91.8 lbs Muscle Mass (lbs): 175.6 lbs Total Body Water (lbs): 137.8 lbs Visceral Fat Rating : 17    No data recorded Today's Visit #: 35  Starting Date: 11/07/20   Subjective   Chief Complaint: Obesity  Troy Peterson is here to discuss his progress with his obesity treatment plan. He maintains a diet of 2000 calories and 200+ grams of protein daily,  and states he is following his eating plan approximately 90 % of the time. He states he is walking 30-60 4-5 times a week and four sets of weights 4- 5  times per week.  Interval History:   Discussed the use of AI scribe software for clinical note transcription with the patient, who gave verbal consent to proceed.  History of Present Illness   This is my first encounter with Troy Peterson.  He is a pleasant 43 year old male who is affected by obesity.  Despite a recent plateau in weight loss, he has managed to lose three pounds over the holiday period. He has been working with Dr. Sharee Holster to adjust his caloric intake, having increased it to 1700 for two weeks, then 1900 for two weeks, and finally attempting 2000 calories. However, he has noticed an increase in hunger since upping his caloric intake.  The patient has observed that he tends to consume more calories in the evening, often eating larger portions of protein-rich foods like chicken.  In terms of protein intake, the patient has been consuming about 150 g/day.  The patient has also been taking losartan hydrochlorothiazide and metformin. He initially  halved the losartan hydrochlorothiazide tablet, but experienced agitation. Upon resuming the full tablet, he reported feeling more relaxed. The patient has requested a lower dose of this medication. He has been taking metformin twice daily without any adverse effects.  The patient's ultimate weight goal is 250 pounds, and he is motivated to continue his weight management program. He attributes his success so far to a significant change in dietary habits, moving away from a diet heavy in fried foods and fast food to a more balanced and nutritious diet.     Orexigenic Control:  Reports problems with appetite and hunger signals.  Denies problems with satiety and satiation.  Denies problems with eating patterns and portion control.  Denies abnormal cravings. Denies feeling deprived or restricted.   Barriers identified: none.   Pharmacotherapy for weight loss: He is currently taking Metformin (off label use for incretin effect and / or insulin resistance and / or diabetes prevention) with adequate clinical response  and without side effects..   Assessment and Plan   Treatment Plan For Obesity:  Recommended Dietary Goals  Troy Peterson is currently in the action stage of change. As such, his goal is to continue weight management plan. He has agreed to: continue current plan  Behavioral Intervention  We discussed the following Behavioral Modification Strategies today:  Patient educated on the role of partitioning and weight management .  He is to distribute calories throughout the day to avoid higher consumption of  calories in the evening hours.  We also emphasized the importance of distributing protein as well and crossing threshold to 40 g per meal for muscle protein synthesis and thermic effect.  Additional resources provided today:  Handout on protein benefits  Recommended Physical Activity Goals  Devarius has been advised to work up to 150 minutes of moderate intensity aerobic activity a week  and strengthening exercises 2-3 times per week for cardiovascular health, weight loss maintenance and preservation of muscle mass.   He has agreed to :  continue to gradually increase the amount and intensity of exercise routine  Pharmacotherapy  We discussed various medication options to help Troy Peterson with his weight loss efforts and we both agreed to : continue current anti-obesity medication regimen  Associated Conditions Addressed and Impacted by Obesity Treatment  Class 2 severe obesity with serious comorbidity and body mass index (BMI) of 35.0 to 35.9 in adult, unspecified obesity type [facility]) Assessment & Plan: Presents for medical weight management. Experienced a plateau in weight loss despite previous efforts. Recent adjustments to caloric intake led to a resumption of weight loss. Advised to distribute caloric intake more evenly throughout the day to avoid excessive evening consumption. Emphasis on achieving at least 40 grams of protein per meal to boost metabolism and promote muscle building. Discussed the physiological basis of hunger and metabolic adaptation to caloric restriction. - Distribute caloric intake evenly throughout the day - Ensure at least 40 grams of protein per meal - Continue current dietary adjustments and monitor weight   Essential hypertension Assessment & Plan: Blood pressure is well-controlled.  Patient has been cutting his losartan in half and experiencing some side effects likely due to medication being time-released and disruption of pharmacokinetics.  As a courtesy I provided him with a one-time refill of losartan 50 mg/hydrochlorothiazide 12.5 mg once a day.  Orders: -     Losartan Potassium-HCTZ; Take 1 tablet by mouth daily.  Dispense: 90 tablet; Refill: 0  Pre-diabetes Assessment & Plan: Most recent A1c is  Lab Results  Component Value Date   HGBA1C 5.5 02/23/2023   HGBA1C 6.0 (H) 10/28/2020   And improved from 6.0.  Patient aware of disease  state and risk of progression. This may contribute to abnormal cravings, fatigue and diabetic complications without having diabetes.    Advised to maintain a diet low on simple and processed carbohydrates.  Is currently on metformin 500 mg 1 tablet twice daily for pharmacoprophylaxis.  Continue current regimen   Orders: -     metFORMIN HCl; Take 1 tablet (500 mg total) by mouth 2 (two) times daily with a meal.  Dispense: 60 tablet; Refill: 1         Objective   Physical Exam:  Blood pressure 120/69, pulse 63, temperature 98.2 F (36.8 C), height 6\' 2"  (1.88 m), weight 276 lb (125.2 kg), SpO2 96%. Body mass index is 35.44 kg/m.  General: He is overweight, cooperative, alert, well developed, and in no acute distress. PSYCH: Has normal mood, affect and thought process.   HEENT: EOMI, sclerae are anicteric. Lungs: Normal breathing effort, no conversational dyspnea. Extremities: No edema.  Neurologic: No gross sensory or motor deficits. No tremors or fasciculations noted.    Diagnostic Data Reviewed:  BMET    Component Value Date/Time   NA 139 02/23/2023 0911   K 4.4 02/23/2023 0911   CL 101 02/23/2023 0911   CO2 26 02/23/2023 0911   GLUCOSE 91 02/23/2023 0911   GLUCOSE 107 (H) 07/30/2020  1700   BUN 19 02/23/2023 0911   CREATININE 0.83 02/23/2023 0911   CALCIUM 9.3 02/23/2023 0911   GFRNONAA >60 02/08/2008 0445   GFRAA  02/08/2008 0445    >60        The eGFR has been calculated using the MDRD equation. This calculation has not been validated in all clinical   Lab Results  Component Value Date   HGBA1C 5.5 02/23/2023   HGBA1C 6.0 (H) 10/28/2020   Lab Results  Component Value Date   INSULIN 11.1 02/23/2023   INSULIN 17.7 10/28/2020   Lab Results  Component Value Date   TSH 1.100 09/14/2022   CBC    Component Value Date/Time   WBC 11.0 (H) 07/30/2020 1700   RBC 4.97 07/30/2020 1700   HGB 13.7 07/30/2020 1700   HCT 41.4 07/30/2020 1700   PLT 256.0  07/30/2020 1700   MCV 83.3 07/30/2020 1700   MCHC 33.1 07/30/2020 1700   RDW 13.9 07/30/2020 1700   Iron Studies No results found for: "IRON", "TIBC", "FERRITIN", "IRONPCTSAT" Lipid Panel     Component Value Date/Time   CHOL 137 02/23/2023 0911   TRIG 97 02/23/2023 0911   HDL 42 02/23/2023 0911   CHOLHDL 3.3 02/23/2023 0911   CHOLHDL 4 07/30/2020 1700   VLDL 16.6 07/30/2020 1700   LDLCALC 77 02/23/2023 0911   LDLDIRECT 102.0 07/25/2016 1600   Hepatic Function Panel     Component Value Date/Time   PROT 7.3 09/14/2022 1659   ALBUMIN 4.4 09/14/2022 1659   AST 21 09/14/2022 1659   ALT 29 09/14/2022 1659   ALKPHOS 82 09/14/2022 1659   BILITOT 0.8 09/14/2022 1659      Component Value Date/Time   TSH 1.100 09/14/2022 1659   Nutritional Lab Results  Component Value Date   VD25OH 54.3 02/23/2023   VD25OH 48.8 09/14/2022   VD25OH 29.9 (L) 12/20/2021    Follow-Up   No follow-ups on file.Marland Kitchen He was informed of the importance of frequent follow up visits to maximize his success with intensive lifestyle modifications for his multiple health conditions.  Attestation Statement   Reviewed by clinician on day of visit: allergies, medications, problem list, medical history, surgical history, family history, social history, and previous encounter notes.     Worthy Rancher, MD

## 2023-07-19 ENCOUNTER — Telehealth: Payer: Self-pay | Admitting: Internal Medicine

## 2023-07-19 NOTE — Telephone Encounter (Signed)
Synetta Fail can you see if his results are in [facility]? Thank you!

## 2023-07-19 NOTE — Telephone Encounter (Signed)
Pt is asking if his snap results can be released to [employer]

## 2023-07-20 NOTE — Telephone Encounter (Signed)
The sleep study was read and signed on 07/09/2023 but it hasn't been scanned in to Epic yet

## 2023-07-21 NOTE — Telephone Encounter (Signed)
I notified the patient. He asked that we send the results to him via MyChart once they have been scanned.   Holding message so we can continue to check on the results.

## 2023-07-31 ENCOUNTER — Encounter (INDEPENDENT_AMBULATORY_CARE_PROVIDER_SITE_OTHER): Payer: Self-pay | Admitting: Family Medicine

## 2023-07-31 ENCOUNTER — Ambulatory Visit (INDEPENDENT_AMBULATORY_CARE_PROVIDER_SITE_OTHER): Payer: 59 | Admitting: Family Medicine

## 2023-07-31 DIAGNOSIS — K5900 Constipation, unspecified: Secondary | ICD-10-CM | POA: Diagnosis not present

## 2023-07-31 DIAGNOSIS — R7303 Prediabetes: Secondary | ICD-10-CM

## 2023-07-31 DIAGNOSIS — E559 Vitamin D deficiency, unspecified: Secondary | ICD-10-CM

## 2023-07-31 DIAGNOSIS — R0602 Shortness of breath: Secondary | ICD-10-CM | POA: Diagnosis not present

## 2023-07-31 DIAGNOSIS — I10 Essential (primary) hypertension: Secondary | ICD-10-CM

## 2023-07-31 DIAGNOSIS — E7849 Other hyperlipidemia: Secondary | ICD-10-CM

## 2023-07-31 DIAGNOSIS — Z6835 Body mass index (BMI) 35.0-35.9, adult: Secondary | ICD-10-CM

## 2023-07-31 DIAGNOSIS — E669 Obesity, unspecified: Secondary | ICD-10-CM

## 2023-07-31 MED ORDER — METFORMIN HCL 500 MG PO TABS: 500.0000 mg | ORAL_TABLET | Freq: Two times a day (BID) | ORAL | 1 refills | Status: DC

## 2023-07-31 MED ORDER — VITAMIN D (ERGOCALCIFEROL) 1.25 MG (50000 UNIT) PO CAPS: 50000.0000 [IU] | ORAL_CAPSULE | ORAL | 0 refills | Status: DC

## 2023-07-31 MED ORDER — POLYETHYLENE GLYCOL 3350 17 GM/SCOOP PO POWD: 17.0000 g | Freq: Two times a day (BID) | ORAL | 1 refills | Status: DC | PRN

## 2023-07-31 MED ORDER — LOSARTAN POTASSIUM-HCTZ 50-12.5 MG PO TABS: ORAL_TABLET | ORAL | 0 refills | Status: DC

## 2023-07-31 NOTE — Progress Notes (Incomplete)
[name], D.O.  ABFM, ABOM Specializing in Clinical Bariatric Medicine  Office located at: [address] [place], [place]  [address]   Assessment and Plan:   Labs obtained today (***) will be reviewed with pt at his next OV.   FOR THE DISEASE OF OBESITY: Obesity, Beginning BMI 47.25 BMI 35.0-35.9,adult - Current BMI 35.42 Assessment & Plan: Since last office visit with Dr. Rikki Spearing on 07/13/23 patient's muscle mass has increased by 3.8lb. Fat mass has decreased by 4.4lb. Total body water has {DID:29233} by ***lb.  Counseling done on how various foods will affect these numbers and how to maximize success  Total lbs lost to date: *** Total weight loss percentage to date: ***    Recommended Dietary Goals Troy Peterson is currently in the action stage of change. As such, his goal is to continue weight management plan.  He has agreed to: {EMWTLOSSPLAN:29297::"continue current plan"}   Behavioral Intervention We discussed the following today: {dowtlossstrategies:31654}  Additional resources provided today: {DOhandouts:31655::"None"}  Evidence-based interventions for health behavior change were utilized today including the discussion of self monitoring techniques, problem-solving barriers and SMART goal setting techniques.   Regarding patient's less desirable eating habits and patterns, we employed the technique of small changes.   Pt will specifically work on: *** for next visit.    Recommended Physical Activity Goals Troy Peterson has been advised to work up to 150 minutes of moderate intensity aerobic activity a week and strengthening exercises 2-3 times per week for cardiovascular health, weight loss maintenance and preservation of muscle mass.   He has agreed to :  {EMEXERCISE:28847::"Think about enjoyable ways to increase daily physical activity and overcoming barriers to exercise","Increase physical activity in their day and reduce sedentary time (increase  NEAT)."}   Pharmacotherapy We discussed various medication options to help Troy Peterson with his weight loss efforts and we both agreed to : {EMagreedrx:29170}   FOR ASSOCIATED CONDITIONS ADDRESSED TODAY:  Vitamin D deficiency Assessment & Plan: Lab Results  Component Value Date   VD25OH 54.3 02/23/2023   VD25OH 48.8 09/14/2022   VD25OH 29.9 (L) 12/20/2021   Pt is on ERGO 50K units once weekly. Tolerating well with no side effects reported. No acute concerns today.       ***  Pre-diabetes Assessment & Plan: Lab Results  Component Value Date   HGBA1C 5.5 02/23/2023   HGBA1C 5.5 09/14/2022   HGBA1C 5.6 12/20/2021   INSULIN 11.1 02/23/2023   INSULIN 7.2 09/14/2022   INSULIN 7.2 12/20/2021    Pt is taking Metformin 500 mg BID. Breakfast and before bed.  ***   Recheck Vitamin D      Essential hypertension Assessment & Plan: BP Readings from Last 3 Encounters:  07/31/23 99/65  07/13/23 120/69  06/22/23 134/76   Occasional lightheadedness about 1-2 per week, only when bending over and quickly getting up when playing with the dog.     Hyzaar 50-12.5 mg once daily -- Advised pt to take a half tab every morning  Monitor his BP at least once every other day.       ***  Constipation, unspecified constipation type Assessment & Plan: ***  Having bowel movements daily. No concerns. Has been using Miralax daily.         Follow up:   No follow-ups on file.*** He was informed of the importance of frequent follow up visits to maximize his success with intensive lifestyle modifications for his multiple health conditions.  Subjective:   Chief complaint:  Obesity Troy Peterson is here to discuss his progress with his obesity treatment plan. He is on the {MWMwtlossportion/plan2:23431} and states he is following his eating plan approximately 90% of the time. He states he is walking and weight lifting 30 minutes 4-5 days per week.  Interval History:  Troy Peterson is  here for a follow up office visit. Since last OV,  ***  For breakfast has 4 eggs and 2 sausages. Notes he does not skip meals, but tends to spread his food out between meal times. Eats an apple, protein bar, yogurt, and protein shakes daily and counts them in his snack category. 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 Struggles meeting protein for lunch, states it has been tricky getting meat with his sandwich.    Barriers identified: {EMOBESITYBARRIERS:28841}.   Pharmacotherapy for weight loss: He is currently taking {EMPharmaco:28845}.   Review of Systems:  Pertinent positives were addressed with patient today.  Reviewed by clinician on day of visit: allergies, medications, problem list, medical history, surgical history, family history, social history, and previous encounter notes.  Weight Summary and Biometrics   Weight Lost Since Last Visit: 0  Weight Gained Since Last Visit: 0  ***  Vitals Temp: 97.8 F (36.6 C) BP: 99/65 Pulse Rate: 61 SpO2: 98 %   Anthropometric Measurements Height: 6\' 2"  (1.88 m) Weight: 276 lb (125.2 kg) BMI (Calculated): 35.42 Weight at Last Visit: 276lb Weight Lost Since Last Visit: 0 Weight Gained Since Last Visit: 0 Starting Weight: 353lb Total Weight Loss (lbs): 77 lb (34.9 kg)   Body Composition  Body Fat %: 31.7 % Fat Mass (lbs): 87.4 lbs Muscle Mass (lbs): 179.4 lbs Total Body Water (lbs): 134.8 lbs Visceral Fat Rating : 16   Other Clinical Data RMR: 2491 Fasting: yes Labs: yes Today's Visit #: 36 Starting Date: 11/07/20    Objective:   PHYSICAL EXAM: Blood pressure 99/65, pulse 61, temperature 97.8 F (36.6 C), height 6\' 2"  (1.88 m), weight 276 lb (125.2 kg), SpO2 98%. Body mass index is 35.44 kg/m.  General: he is overweight, cooperative and in no acute distress. PSYCH: Has normal mood, affect and thought process.   HEENT: EOMI, sclerae are  anicteric. Lungs: Normal breathing effort, no conversational dyspnea. Extremities: Moves * 4 Neurologic: A and O * 3, good insight  DIAGNOSTIC DATA REVIEWED: BMET    Component Value Date/Time   NA 139 02/23/2023 0911   K 4.4 02/23/2023 0911   CL 101 02/23/2023 0911   CO2 26 02/23/2023 0911   GLUCOSE 91 02/23/2023 0911   GLUCOSE 107 (H) 07/30/2020 1700   BUN 19 02/23/2023 0911   CREATININE 0.83 02/23/2023 0911   CALCIUM 9.3 02/23/2023 0911   GFRNONAA >60 02/08/2008 0445   GFRAA  02/08/2008 0445    >60        The eGFR has been calculated using the MDRD equation. This calculation has not been validated in all clinical   Lab Results  Component Value Date   HGBA1C 5.5 02/23/2023   HGBA1C 6.0 (H) 10/28/2020   Lab Results  Component Value Date   INSULIN 11.1 02/23/2023   INSULIN 17.7 10/28/2020   Lab Results  Component Value Date   TSH 1.100 09/14/2022   CBC    Component Value Date/Time   WBC 11.0 (H) 07/30/2020 1700   RBC 4.97 07/30/2020 1700   HGB 13.7 07/30/2020 1700   HCT 41.4 07/30/2020  1700   PLT 256.0 07/30/2020 1700   MCV 83.3 07/30/2020 1700   MCHC 33.1 07/30/2020 1700   RDW 13.9 07/30/2020 1700   Iron Studies No results found for: "IRON", "TIBC", "FERRITIN", "IRONPCTSAT" Lipid Panel     Component Value Date/Time   CHOL 137 02/23/2023 0911   TRIG 97 02/23/2023 0911   HDL 42 02/23/2023 0911   CHOLHDL 3.3 02/23/2023 0911   CHOLHDL 4 07/30/2020 1700   VLDL 16.6 07/30/2020 1700   LDLCALC 77 02/23/2023 0911   LDLDIRECT 102.0 07/25/2016 1600   Hepatic Function Panel     Component Value Date/Time   PROT 7.3 09/14/2022 1659   ALBUMIN 4.4 09/14/2022 1659   AST 21 09/14/2022 1659   ALT 29 09/14/2022 1659   ALKPHOS 82 09/14/2022 1659   BILITOT 0.8 09/14/2022 1659      Component Value Date/Time   TSH 1.100 09/14/2022 1659   Nutritional Lab Results  Component Value Date   VD25OH 54.3 02/23/2023   VD25OH 48.8 09/14/2022   VD25OH 29.9 (L)  12/20/2021    Attestations:   Burnett Sheng, acting as a medical scribe for Thomasene Lot, DO., have compiled all relevant documentation for today's office visit on behalf of Thomasene Lot, DO, while in the presence of [employer], DO.  Reviewed by clinician on day of visit: allergies, medications, problem list, medical history, surgical history, family history, social history, and previous encounter notes pertinent to patient's obesity diagnosis.  I have spent 40 *** minutes in the care of the patient today including: preparing to see patient (e.g. review and interpretation of tests, old notes ), obtaining and/or reviewing separately obtained history, performing a medically appropriate examination or evaluation, counseling and educating the patient, ordering medications, test or procedures, documenting clinical information in the electronic or other health care record, and independently interpreting results and communicating results to the patient, family, or caregiver   I have reviewed the above documentation for accuracy and completeness, and I agree with the above. [name], D.O.  The 21st Century Cures Act was signed into law in 2016 which includes the topic of electronic health records.  This provides immediate access to information in MyChart.  This includes consultation notes, operative notes, office notes, lab results and pathology reports.  If you have any questions about what you read please let us know at your next visit so we can discuss your concerns and take corrective action if need be.  We are right here with you.

## 2023-08-03 LAB — COMPREHENSIVE METABOLIC PANEL
ALT: 19 IU/L (ref 0–44)
AST: 20 IU/L (ref 0–40)
Albumin: 4.5 g/dL (ref 4.1–5.1)
Alkaline Phosphatase: 76 IU/L (ref 44–121)
BUN/Creatinine Ratio: 22 — ABNORMAL HIGH (ref 9–20)
BUN: 17 mg/dL (ref 6–24)
Bilirubin Total: 0.5 mg/dL (ref 0.0–1.2)
CO2: 20 mmol/L (ref 20–29)
Calcium: 9.5 mg/dL (ref 8.7–10.2)
Chloride: 102 mmol/L (ref 96–106)
Creatinine, Ser: 0.78 mg/dL (ref 0.76–1.27)
Globulin, Total: 2.5 g/dL (ref 1.5–4.5)
Glucose: 94 mg/dL (ref 70–99)
Potassium: 5 mmol/L (ref 3.5–5.2)
Sodium: 143 mmol/L (ref 134–144)
Total Protein: 7 g/dL (ref 6.0–8.5)
eGFR: 113 mL/min/{1.73_m2} (ref >59)

## 2023-08-03 LAB — HEMOGLOBIN A1C
Est. average glucose Bld gHb Est-mCnc: 117 mg/dL
Hgb A1c MFr Bld: 5.7 % — ABNORMAL HIGH (ref 4.8–5.6)

## 2023-08-03 LAB — VITAMIN B12: Vitamin B-12: 609 pg/mL (ref 232–1245)

## 2023-08-03 LAB — MAGNESIUM: Magnesium: 2.2 mg/dL (ref 1.6–2.3)

## 2023-08-03 LAB — VITAMIN D 25 HYDROXY (VIT D DEFICIENCY, FRACTURES): Vit D, 25-Hydroxy: 46.1 ng/mL (ref 30.0–100.0)

## 2023-08-03 NOTE — Telephone Encounter (Signed)
 Troy Peterson can you print this sleep study and get it scanned into Epic

## 2023-08-14 NOTE — Telephone Encounter (Addendum)
 I notified the patient. He said he wore his Cpap machine the first night of the test but the not second and third night. He said a lady from [facility] told him to wear his CPAP machine the first night. Dr. Belia Heman please advise.

## 2023-08-14 NOTE — Telephone Encounter (Addendum)
 Troy Peterson

## 2023-08-14 NOTE — Telephone Encounter (Addendum)
 Per secure chat from Dr. Belia Heman, the patient needs to have a HST without using his CPAP.  Anita/Holly, what can we do so that  he is not charged again for this study? He said a lady from [facility] told him to wear his CPAP the first night and not the last 2 nights so that is what he did. Now he is having to have another test done because Dr. Belia Heman did not want him to wear the CPAP at all.

## 2023-08-15 NOTE — Telephone Encounter (Signed)
 I have spoke with Troy Peterson with [facility] and she is checking on this issue we were concerned there was no oximetry results on either night study

## 2023-08-16 NOTE — Telephone Encounter (Signed)
 Troy Peterson with [facility] called me back and stated she spoke with Gill higher up in [facility]. They will allow the patient to do the sleep study again with no charge but there will be certain rules he will have to abide by to not be billed a 2nd time. He has to wear 3 nights without cpap and to make sure the Oximetry is on and working. They are suppose to reach back out to the patient with the rules

## 2023-08-22 ENCOUNTER — Ambulatory Visit (INDEPENDENT_AMBULATORY_CARE_PROVIDER_SITE_OTHER): Payer: 59 | Admitting: Family Medicine

## 2023-08-29 ENCOUNTER — Encounter

## 2023-09-06 ENCOUNTER — Ambulatory Visit: Payer: 59 | Admitting: Internal Medicine

## 2023-09-11 ENCOUNTER — Telehealth: Payer: Self-pay | Admitting: Internal Medicine

## 2023-09-11 ENCOUNTER — Ambulatory Visit: Payer: 59 | Admitting: Internal Medicine

## 2023-09-11 NOTE — Telephone Encounter (Signed)
 Noted.  Synetta Fail can you see when the second test was done and if we have the results yet? Thank you!

## 2023-09-11 NOTE — Telephone Encounter (Signed)
 I have emailed Victorino Dike with [facility] to see if she has an update on this patient

## 2023-09-11 NOTE — Telephone Encounter (Signed)
 Lm x1 for the patient. Wanting to ask if he has done the HST again.

## 2023-09-11 NOTE — Telephone Encounter (Signed)
 PT states he is ret Kristina's call. Please try again.  @ [phone] Also, I will resched so no need to CB if this was why she was calling.

## 2023-09-11 NOTE — Telephone Encounter (Signed)
 Error

## 2023-09-11 NOTE — Telephone Encounter (Signed)
 Patient is returning missed call. Patient  has had a HST done again. He had it done by [facility].

## 2023-09-12 NOTE — Telephone Encounter (Signed)
 This is the response that I got from [place] with [facility] I just checked on Mr. Troy Peterson and his test kit was shipped out on 08/25/2023. After further review the kit is on its way back to [place] and moving through the [employer] system. Tracking says arriving late.

## 2023-09-14 ENCOUNTER — Ambulatory Visit (INDEPENDENT_AMBULATORY_CARE_PROVIDER_SITE_OTHER): Payer: 59 | Admitting: Family Medicine

## 2023-09-14 ENCOUNTER — Other Ambulatory Visit (INDEPENDENT_AMBULATORY_CARE_PROVIDER_SITE_OTHER): Payer: Self-pay | Admitting: Family Medicine

## 2023-09-14 ENCOUNTER — Encounter (INDEPENDENT_AMBULATORY_CARE_PROVIDER_SITE_OTHER): Payer: Self-pay | Admitting: Family Medicine

## 2023-09-14 DIAGNOSIS — E559 Vitamin D deficiency, unspecified: Secondary | ICD-10-CM

## 2023-09-14 DIAGNOSIS — K5900 Constipation, unspecified: Secondary | ICD-10-CM | POA: Diagnosis not present

## 2023-09-14 DIAGNOSIS — E669 Obesity, unspecified: Secondary | ICD-10-CM

## 2023-09-14 DIAGNOSIS — R7303 Prediabetes: Secondary | ICD-10-CM | POA: Diagnosis not present

## 2023-09-14 DIAGNOSIS — Z6834 Body mass index (BMI) 34.0-34.9, adult: Secondary | ICD-10-CM

## 2023-09-14 MED ORDER — VITAMIN D (ERGOCALCIFEROL) 1.25 MG (50000 UNIT) PO CAPS: 50000.0000 [IU] | ORAL_CAPSULE | ORAL | 0 refills | Status: DC

## 2023-09-14 MED ORDER — POLYETHYLENE GLYCOL 3350 17 GM/SCOOP PO POWD
17.0000 g | Freq: Two times a day (BID) | ORAL | 0 refills | Status: AC | PRN
Start: 2023-09-14 — End: ?

## 2023-09-14 MED ORDER — METFORMIN HCL 500 MG PO TABS: ORAL_TABLET | ORAL | 0 refills | Status: DC

## 2023-09-14 NOTE — Progress Notes (Signed)
 Troy Peterson, D.O.  ABFM, ABOM Specializing in Clinical Bariatric Medicine  Office located at: [address] [place], [place]  [address]   Assessment and Plan:   No orders of the defined types were placed in this encounter.   Medications Discontinued During This Encounter  Medication Reason   Vitamin D, Ergocalciferol, (DRISDOL) 1.25 MG (50000 UNIT) CAPS capsule Reorder   metFORMIN (GLUCOPHAGE) 500 MG tablet    polyethylene glycol powder (GLYCOLAX/MIRALAX) 17 GM/SCOOP powder Reorder     Meds ordered this encounter  Medications   Vitamin D, Ergocalciferol, (DRISDOL) 1.25 MG (50000 UNIT) CAPS capsule    Sig: Take 1 capsule (50,000 Units total) by mouth every 7 (seven) days.    Dispense:  4 capsule    Refill:  0   polyethylene glycol powder (GLYCOLAX/MIRALAX) 17 GM/SCOOP powder    Sig: Take 17 g by mouth 2 (two) times daily as needed.    Dispense:  850 g    Refill:  0   metFORMIN (GLUCOPHAGE) 500 MG tablet    Sig: 1 po with breakfast and 1 po lunch; and 2 po dinner    Dispense:  120 tablet    Refill:  0      FOR THE DISEASE OF OBESITY: Obesity, Beginning BMI 47.25 BMI 34.0-34.9,adult - Current BMI 34.91 Assessment & Plan: Since last office visit on /3/25 patient's muscle mass has decreased by 3.4 lb. Fat mass has increased by 0.2 lb. Total body water has decreased by 1.2 lb.  Counseling done on how various foods will affect these numbers and how to maximize success  Total lbs lost to date: 81 lbs Total weight loss percentage to date: -22.95%    Recommended Dietary Goals Troy Peterson is currently in the action stage of change. As such, his goal is to continue weight management plan.  He has agreed to: continue current plan   Behavioral Intervention We discussed the following today: increasing lean protein intake to established goals, decreasing simple carbohydrates , avoiding skipping meals, increasing water intake , and decreasing eating out or consumption of  processed foods, and making healthy choices when eating convenient foods  Additional resources provided today: None  Evidence-based interventions for health behavior change were utilized today including the discussion of self monitoring techniques, problem-solving barriers and SMART goal setting techniques.   Regarding patient's less desirable eating habits and patterns, we employed the technique of small changes.   Pt will specifically work on: Continue with weight lifting and try leg exercises if able for next visit.    Recommended Physical Activity Goals Troy Peterson has been advised to work up to 150 minutes of moderate intensity aerobic activity a week and strengthening exercises 2-3 times per week for cardiovascular health, weight loss maintenance and preservation of muscle mass.   He has agreed to :  Think about enjoyable ways to increase daily physical activity and overcoming barriers to exercise, Increase physical activity in their day and reduce sedentary time (increase NEAT)., continue to gradually increase the amount and intensity of exercise routine, and try leg exercises    Pharmacotherapy We both agreed to: continue with nutritional and behavioral strategies and Increase Metformin (see below)   FOR ASSOCIATED CONDITIONS ADDRESSED TODAY: Pre-diabetes Assessment & Plan: Lab Results  Component Value Date   HGBA1C 5.7 (H) 07/31/2023   HGBA1C 5.5 02/23/2023   HGBA1C 5.5 09/14/2022   INSULIN 11.1 02/23/2023   INSULIN 7.2 09/14/2022   INSULIN 7.2 12/20/2021    Most recent  A1C has increased to 5.7 from prior labs at 5.5. Pt is compliant with Metformin 500 mg BID. Tolerating well with no adverse SE reported. Endorses sweet cravings and occasional hunger. Tends to eat a yogurt between meals. He occasionally is not hungry by dinner time but still eats.   Discussed his options of increasing his Metformin for better control of A1c and hunger/cravings. Mutually agreed to increase  Metformin dose to 500 mg with breakfast, 500 mg with lunch, and 1,000 mg with dinner. Reviewed potential risks and side effects. Encouraged pt to decrease simple carbs/sugars, avoid any off-plan foods, and increase his protein intake per his meal plan. Encouraged pt to increase exercise and try leg exercises when weight lifting. Will continue to monitor condition as it relates to his weight loss journey.   Orders: - Increase Metformin (see above).    Vitamin D deficiency Assessment & Plan: Lab Results  Component Value Date   VD25OH 46.1 07/31/2023   VD25OH 54.3 02/23/2023   VD25OH 48.8 09/14/2022   Pt is on ERGO 50K units once weekly. Tolerating well with no adverse SE. He reports working outdoors. No acute concerns in this regard today.   Reviewed ideal vitamin D levels of 50-70. Pt verbalized understanding. Continue with current supplementation regimen as prescribed. Will refill ERGO with no changes today. Will continue to monitor condition as it pertains to his weight loss.   Orders: - Refill ERGO, no dose changes.    Constipation, unspecified constipation type Assessment & Plan: Pt was taking Miralax once daily in the mornings but recently ran out. Since running out, his constipation has worsened. He is drinking 9-10 bottles of water a day from Monday to Friday. He is also exercising regularly.   I recommend pt take Miralax BID as needed. Reviewed how staying properly hydrated and walking will help with constipation. Will continue monitoring of condition alongside PCP.   Orders: - Refill Miralax   Follow up:   Return in about 3 weeks (around 10/05/2023) for 3 week follow up. He was informed of the importance of frequent follow up visits to maximize his success with intensive lifestyle modifications for his multiple health conditions.  Subjective:   Chief complaint: Obesity Troy Peterson is here to discuss his progress with his obesity treatment plan. He is  keeping a food journal  and adhering to recommended goals of 1900 calories and 150+ g of protein and states he is following his eating plan approximately 90% of the time. He states he is doing cardio/weigh lifting for 30-60 minutes 6 days per week.   Interval History:  Troy Peterson is here for a follow up office visit. Since last OV on 07/31/23,  she is down 4 lbs. Most days he is eating around 1800-2000 calories daily, occasionally goes over 2000 calories. Is still journaling daily. Has been meal prepping and planning. Once a week he cooks enough chicken breast for the week. Has been staying more active, exercising twice daily and recently went on a 4 mile hike with his wife recently.   Pharmacotherapy for weight loss: He is currently taking Metformin (off label use for incretin effect and / or insulin resistance and / or diabetes prevention) with adequate clinical response  and without side effects..   Review of Systems:  Pertinent positives were addressed with patient today.  Reviewed by clinician on day of visit: allergies, medications, problem list, medical history, surgical history, family history, social history, and previous encounter notes.  Weight Summary and Biometrics  Weight Lost Since Last Visit: 4 lb  Weight Gained Since Last Visit: 0    Vitals Temp: 98.1 F (36.7 C) BP: 127/70 Pulse Rate: 65 SpO2: 97 %   Anthropometric Measurements Height: 6\' 2"  (1.88 m) Weight: 272 lb (123.4 kg) BMI (Calculated): 34.91 Weight at Last Visit: 276 lb Weight Lost Since Last Visit: 4 lb Weight Gained Since Last Visit: 0 Starting Weight: 353 lb Total Weight Loss (lbs): 81 lb (36.7 kg)   Body Composition  Body Fat %: 32.1 % Fat Mass (lbs): 87.6 lbs Muscle Mass (lbs): 176 lbs Total Body Water (lbs): 133.6 lbs Visceral Fat Rating : 16   Other Clinical Data Fasting: No Labs: No Today's Visit #: 37 Starting Date: 11/07/20    Objective:   PHYSICAL EXAM: Blood pressure 127/70, pulse 65,  temperature 98.1 F (36.7 C), height 6\' 2"  (1.88 m), weight 272 lb (123.4 kg), SpO2 97%. Body mass index is 34.92 kg/m.  General: he is overweight, cooperative and in no acute distress. PSYCH: Has normal mood, affect and thought process.   HEENT: EOMI, sclerae are anicteric. Lungs: Normal breathing effort, no conversational dyspnea. Extremities: Moves * 4 Neurologic: A and O * 3, good insight  DIAGNOSTIC DATA REVIEWED: BMET    Component Value Date/Time   NA 143 07/31/2023 0917   K 5.0 07/31/2023 0917   CL 102 07/31/2023 0917   CO2 20 07/31/2023 0917   GLUCOSE 94 07/31/2023 0917   GLUCOSE 107 (H) 07/30/2020 1700   BUN 17 07/31/2023 0917   CREATININE 0.78 07/31/2023 0917   CALCIUM 9.5 07/31/2023 0917   GFRNONAA >60 02/08/2008 0445   GFRAA  02/08/2008 0445    >60        The eGFR has been calculated using the MDRD equation. This calculation has not been validated in all clinical   Lab Results  Component Value Date   HGBA1C 5.7 (H) 07/31/2023   HGBA1C 6.0 (H) 10/28/2020   Lab Results  Component Value Date   INSULIN 11.1 02/23/2023   INSULIN 17.7 10/28/2020   Lab Results  Component Value Date   TSH 1.100 09/14/2022   CBC    Component Value Date/Time   WBC 11.0 (H) 07/30/2020 1700   RBC 4.97 07/30/2020 1700   HGB 13.7 07/30/2020 1700   HCT 41.4 07/30/2020 1700   PLT 256.0 07/30/2020 1700   MCV 83.3 07/30/2020 1700   MCHC 33.1 07/30/2020 1700   RDW 13.9 07/30/2020 1700   Iron Studies No results found for: "IRON", "TIBC", "FERRITIN", "IRONPCTSAT" Lipid Panel     Component Value Date/Time   CHOL 137 02/23/2023 0911   TRIG 97 02/23/2023 0911   HDL 42 02/23/2023 0911   CHOLHDL 3.3 02/23/2023 0911   CHOLHDL 4 07/30/2020 1700   VLDL 16.6 07/30/2020 1700   LDLCALC 77 02/23/2023 0911   LDLDIRECT 102.0 07/25/2016 1600   Hepatic Function Panel     Component Value Date/Time   PROT 7.0 07/31/2023 0917   ALBUMIN 4.5 07/31/2023 0917   AST 20 07/31/2023 0917    ALT 19 07/31/2023 0917   ALKPHOS 76 07/31/2023 0917   BILITOT 0.5 07/31/2023 0917      Component Value Date/Time   TSH 1.100 09/14/2022 1659   Nutritional Lab Results  Component Value Date   VD25OH 46.1 07/31/2023   VD25OH 54.3 02/23/2023   VD25OH 48.8 09/14/2022    Attestations:   I, Isabelle Course, acting as a Stage manager for [employer], DO., have compiled  all relevant documentation for today's office visit on behalf of Thomasene Lot, DO, while in the presence of [employer], DO.  Reviewed by clinician on day of visit: allergies, medications, problem list, medical history, surgical history, family history, social history, and previous encounter notes pertinent to patient's obesity diagnosis.  I have spent 40 minutes in the care of the patient today including: preparing to see patient (e.g. review and interpretation of tests, old notes ), obtaining and/or reviewing separately obtained history, performing a medically appropriate examination or evaluation, counseling and educating the patient, ordering medications, test or procedures, documenting clinical information in the electronic or other health care record, and independently interpreting results and communicating results to the patient, family, or caregiver   I have reviewed the above documentation for accuracy and completeness, and I agree with the above. Troy Peterson, D.O.  The 21st Century Cures Act was signed into law in 2016 which includes the topic of electronic health records.  This provides immediate access to information in MyChart.  This includes consultation notes, operative notes, office notes, lab results and pathology reports.  If you have any questions about what you read please let us know at your next visit so we can discuss your concerns and take corrective action if need be.  We are right here with you.

## 2023-09-18 NOTE — Telephone Encounter (Signed)
 Noted. Nothing further needed.

## 2023-09-26 DIAGNOSIS — R0683 Snoring: Secondary | ICD-10-CM | POA: Diagnosis not present

## 2023-10-18 ENCOUNTER — Ambulatory Visit: Admitting: Internal Medicine

## 2023-10-18 ENCOUNTER — Encounter: Payer: Self-pay | Admitting: Internal Medicine

## 2023-10-18 VITALS — BP 118/80 | HR 64 | Temp 98.6°F | Ht 75.0 in | Wt 279.2 lb

## 2023-10-18 DIAGNOSIS — Z6834 Body mass index (BMI) 34.0-34.9, adult: Secondary | ICD-10-CM | POA: Diagnosis not present

## 2023-10-18 DIAGNOSIS — Z87891 Personal history of nicotine dependence: Secondary | ICD-10-CM | POA: Diagnosis not present

## 2023-10-18 DIAGNOSIS — E66812 Obesity, class 2: Secondary | ICD-10-CM | POA: Diagnosis not present

## 2023-10-18 NOTE — Progress Notes (Signed)
 Name: Troy Peterson MRN: [id number] DOB: 1980/11/11    CHIEF COMPLAINT:  DIAGNOSIS OF OSA   HISTORY OF PRESENT ILLNESS: Previously DOT physical and was found to have a big neck and obesity HST 2019 shows MILD OSA AHI 7 Was started on CPAP Patient weight 337 pounds     Discussed sleep data and reviewed with patient.  Encouraged proper weight management.  Discussed driving precautions and its relationship with hypersomnolence.  Discussed operating dangerous equipment and its relationship with hypersomnolence.  Discussed sleep hygiene, and benefits of a fixed sleep waked time.  The importance of getting eight or more hours of sleep discussed with patient.  Discussed limiting the use of the computer and television before bedtime.  Decrease naps during the day, so night time sleep will become enhanced.   HST report reviewed in detail with patient As no significant AHI at this time Patient no longer needs CPAP  PAST MEDICAL HISTORY :   has a past medical history of Hypertension and OSA (obstructive sleep apnea) (01/09/2018).  has a past surgical history that includes Hip surgery (6th grade). Prior to Admission medications   Medication Sig Start Date End Date Taking? Authorizing Provider  losartan -hydrochlorothiazide (HYZAAR) 100-12.5 MG tablet Take 0.5 tablets by mouth daily. 05/31/23   Donnie Galea, MD  metFORMIN  (GLUCOPHAGE ) 500 MG tablet Take 1 tablet (500 mg total) by mouth 2 (two) times daily with a meal. 04/25/23 06/24/23  Opalski, Bernardo Bridgeman, DO  Omega-3 Fatty Acids (FISH OIL ) 1200 MG CAPS Take 3 tabs daily 04/25/23   Opalski, Bernardo Bridgeman, DO  polyethylene glycol powder (GLYCOLAX /MIRALAX ) 17 GM/SCOOP powder Take 17 g by mouth 2 (two) times daily as needed. 04/25/23   Opalski, Bernardo Bridgeman, DO  tadalafil  (CIALIS ) 20 MG tablet Take 0.5-1 tablets (10-20 mg total) by mouth every other day as needed for erectile dysfunction. 03/23/23   Donnie Galea, MD  Vitamin D , Ergocalciferol ,  (DRISDOL ) 1.25 MG (50000 UNIT) CAPS capsule Take 1 capsule (50,000 Units total) by mouth every 7 (seven) days. 04/25/23   Opalski, Bernardo Bridgeman, DO   No Known Allergies  FAMILY HISTORY:  family history includes Cancer in his mother; High blood pressure in his mother; Stroke in his mother. SOCIAL HISTORY:  reports that he has quit smoking. He has never used smokeless tobacco. He reports that he does not drink alcohol and does not use drugs.  BP 118/80 (BP Location: Right Arm, Patient Position: Sitting, Cuff Size: Normal)   Pulse 64   Temp 98.6 F (37 C) (Oral)   Ht 6\' 3"  (1.905 m)   Wt 279 lb 3.2 oz (126.6 kg)   SpO2 97%   BMI 34.90 kg/m       Review of Systems: Gen:  Denies  fever, sweats, chills weight loss  HEENT: Denies blurred vision, double vision, ear pain, eye pain, hearing loss, nose bleeds, sore throat Cardiac:  No dizziness, chest pain or heaviness, chest tightness,edema, No JVD Resp:   No cough, -sputum production, -shortness of breath,-wheezing, -hemoptysis,  Other:  All other systems negative   Physical Examination:   General Appearance: No distress  EYES PERRLA, EOM intact.   NECK Supple, No JVD Pulmonary: normal breath sounds, No wheezing.  CardiovascularNormal S1,S2.  No m/r/g.   Abdomen: Benign, Soft, non-tender. Neurology UE/LE 5/5 strength, no focal deficits Ext pulses intact, cap refill intact ALL OTHER ROS ARE NEGATIVE     ASSESSMENT AND PLAN SYNOPSIS  43 year old pleasant white male seen today for reassessment of his  underlying sleep apnea 2019 patient was diagnosed with mild OSA when he weighed 340 pounds Currently he weighs 279 pounds and symptoms of sleep apnea have dissipated with time and weight loss Patient does work for [employer] Repeat sleep study shows no significant sleep apnea therefore patient no longer needs CPAP therapy  Obesity -recommend significant weight loss -recommend changing diet Weight goal is 250  pounds -Recommend increased daily activity and exercise   MEDICATION ADJUSTMENTS/LABS AND TESTS ORDERED: No indication for CPAP therapy at this time Continue weight loss journey  CURRENT MEDICATIONS REVIEWED AT LENGTH WITH PATIENT TODAY   Patient  satisfied with Plan of action and management. All questions answered  Follow-up as needed Total Time Spent 42 minutes   Lady Pier, M.D.  [facility]  Medical Director [facility] Medical Director [facility] Cardio-Pulmonary Department

## 2023-10-18 NOTE — Progress Notes (Signed)
 Name: Troy Peterson MRN:   [id number] DOB:   30-Jun-1980            CHIEF COMPLAINT:  DIAGNOSIS OF OSA     HISTORY OF PRESENT ILLNESS: Previously DOT physical and was found to have a big neck and obesity HST 2019 shows MILD OSA AHI 7 Was started on CPAP Patient weight 337 pounds       Discussed sleep data and reviewed with patient.  Encouraged proper weight management.  Discussed driving precautions and its relationship with hypersomnolence.  Discussed operating dangerous equipment and its relationship with hypersomnolence.  Discussed sleep hygiene, and benefits of a fixed sleep waked time.  The importance of getting eight or more hours of sleep discussed with patient.  Discussed limiting the use of the computer and television before bedtime.  Decrease naps during the day, so night time sleep will become enhanced.    HST report reviewed in detail with patient As no significant AHI at this time Patient no longer needs CPAP   PAST MEDICAL HISTORY :   has a past medical history of Hypertension and OSA (obstructive sleep apnea) (01/09/2018).  has a past surgical history that includes Hip surgery (6th grade).        Prior to Admission medications   Medication Sig Start Date End Date Taking? Authorizing Provider  losartan -hydrochlorothiazide (HYZAAR) 100-12.5 MG tablet Take 0.5 tablets by mouth daily. 05/31/23     Donnie Galea, MD  metFORMIN  (GLUCOPHAGE ) 500 MG tablet Take 1 tablet (500 mg total) by mouth 2 (two) times daily with a meal. 04/25/23 06/24/23   Opalski, Bernardo Bridgeman, DO  Omega-3 Fatty Acids (FISH OIL ) 1200 MG CAPS Take 3 tabs daily 04/25/23     Opalski, Bernardo Bridgeman, DO  polyethylene glycol powder (GLYCOLAX /MIRALAX ) 17 GM/SCOOP powder Take 17 g by mouth 2 (two) times daily as needed. 04/25/23     Opalski, Bernardo Bridgeman, DO  tadalafil  (CIALIS ) 20 MG tablet Take 0.5-1 tablets (10-20 mg total) by mouth every other day as needed for erectile dysfunction. 03/23/23     Donnie Galea, MD   Vitamin D , Ergocalciferol , (DRISDOL ) 1.25 MG (50000 UNIT) CAPS capsule Take 1 capsule (50,000 Units total) by mouth every 7 (seven) days. 04/25/23     Marceil Sensor, DO    Allergies  No Known Allergies     FAMILY HISTORY:  family history includes Cancer in his mother; High blood pressure in his mother; Stroke in his mother. SOCIAL HISTORY:  reports that he has quit smoking. He has never used smokeless tobacco. He reports that he does not drink alcohol and does not use drugs.   BP 118/80 (BP Location: Right Arm, Patient Position: Sitting, Cuff Size: Normal)   Pulse 64   Temp 98.6 F (37 C) (Oral)   Ht 6\' 3"  (1.905 m)   Wt 279 lb 3.2 oz (126.6 kg)   SpO2 97%   BMI 34.90 kg/m            Review of Systems: Gen:  Denies  fever, sweats, chills weight loss  HEENT: Denies blurred vision, double vision, ear pain, eye pain, hearing loss, nose bleeds, sore throat Cardiac:  No dizziness, chest pain or heaviness, chest tightness,edema, No JVD Resp:   No cough, -sputum production, -shortness of breath,-wheezing, -hemoptysis,  Other:  All other systems negative     Physical Examination:    General Appearance: No distress  EYES PERRLA, EOM intact.   NECK Supple, No JVD Pulmonary: normal breath sounds, No  wheezing.  CardiovascularNormal S1,S2.  No m/r/g.   Abdomen: Benign, Soft, non-tender. Neurology UE/LE 5/5 strength, no focal deficits Ext pulses intact, cap refill intact ALL OTHER ROS ARE NEGATIVE         ASSESSMENT AND PLAN SYNOPSIS   43 year old pleasant white male seen today for reassessment of his underlying sleep apnea 2019 patient was diagnosed with mild OSA when he weighed 340 pounds Currently he weighs 279 pounds and symptoms of sleep apnea have dissipated with time and weight loss Patient does work for [employer] Repeat sleep study shows no significant sleep apnea therefore patient no longer needs CPAP therapy   Obesity -recommend  significant weight loss -recommend changing diet Weight goal is 250 pounds -Recommend increased daily activity and exercise     MEDICATION ADJUSTMENTS/LABS AND TESTS ORDERED: No indication for CPAP therapy at this time Continue weight loss journey   CURRENT MEDICATIONS REVIEWED AT LENGTH WITH PATIENT TODAY     Patient  satisfied with Plan of action and management. All questions answered   Follow-up as needed Total Time Spent 42 minutes     Lady Pier, M.D.  [facility]  Medical Director [facility] Medical Director [facility] Cardio-Pulmonary Department

## 2023-10-18 NOTE — Patient Instructions (Signed)
 With Your Significant weight loss, your Obstructive sleep apnea has resolved! Congratulations!  Please continue to maintain current weight and recommend more weight loss  Avoid Allergens and Irritants Avoid secondhand smoke Avoid SICK contacts Recommend  Masking  when appropriate Recommend Keep up-to-date with vaccinations

## 2023-10-19 ENCOUNTER — Ambulatory Visit (INDEPENDENT_AMBULATORY_CARE_PROVIDER_SITE_OTHER): Admitting: Family Medicine

## 2023-10-19 ENCOUNTER — Encounter (INDEPENDENT_AMBULATORY_CARE_PROVIDER_SITE_OTHER): Payer: Self-pay | Admitting: Family Medicine

## 2023-10-19 VITALS — BP 124/80 | HR 57 | Temp 98.0°F | Ht 75.0 in | Wt 270.0 lb

## 2023-10-19 DIAGNOSIS — G4733 Obstructive sleep apnea (adult) (pediatric): Secondary | ICD-10-CM

## 2023-10-19 DIAGNOSIS — E559 Vitamin D deficiency, unspecified: Secondary | ICD-10-CM

## 2023-10-19 DIAGNOSIS — E669 Obesity, unspecified: Secondary | ICD-10-CM

## 2023-10-19 DIAGNOSIS — Z6833 Body mass index (BMI) 33.0-33.9, adult: Secondary | ICD-10-CM

## 2023-10-19 DIAGNOSIS — R7303 Prediabetes: Secondary | ICD-10-CM | POA: Diagnosis not present

## 2023-10-19 DIAGNOSIS — Z6835 Body mass index (BMI) 35.0-35.9, adult: Secondary | ICD-10-CM

## 2023-10-19 MED ORDER — VITAMIN D (ERGOCALCIFEROL) 1.25 MG (50000 UNIT) PO CAPS: 50000.0000 [IU] | ORAL_CAPSULE | ORAL | 1 refills | Status: DC

## 2023-10-19 MED ORDER — METFORMIN HCL 500 MG PO TABS: ORAL_TABLET | ORAL | 1 refills | Status: DC

## 2023-10-19 NOTE — Progress Notes (Signed)
 Troy Peterson, D.O.  ABFM, ABOM Specializing in Clinical Bariatric Medicine  Office located at: [address] [place], [place]  [address]   Assessment and Plan:   Medications Discontinued During This Encounter  Medication Reason   Vitamin D , Ergocalciferol , (DRISDOL ) 1.25 MG (50000 UNIT) CAPS capsule Reorder   metFORMIN  (GLUCOPHAGE ) 500 MG tablet Reorder     Meds ordered this encounter  Medications   Vitamin D , Ergocalciferol , (DRISDOL ) 1.25 MG (50000 UNIT) CAPS capsule    Sig: Take 1 capsule (50,000 Units total) by mouth every 7 (seven) days.    Dispense:  4 capsule    Refill:  1   metFORMIN  (GLUCOPHAGE ) 500 MG tablet    Sig: 1 po with breakfast and 1 po lunch; and 2 po dinner    Dispense:  120 tablet    Refill:  1    FOR THE DISEASE OF OBESITY:  BMI 35.0-35.9,adult - Current BMI 33.75 Obesity, Beginning BMI 47.25 Assessment & Plan: Since last office visit on 09/14/2023 patient's  Muscle mass has decreased by 1.2 lb. Fat mass has decreased by 0.8 lb. Total body water has increased by 2 lb.  Counseling done on how various foods will affect these numbers and how to maximize success  Total lbs lost to date: - 98 lbs  Total weight loss percentage to date: -26.63%    Recommended Dietary Goals Troy Peterson is currently in the action stage of change. As such, his goal is to continue weight management plan.  He has agreed to: continue current plan    Behavioral Intervention We discussed the following today: decreasing consumption of smoked meats and sodium, increasing lean protein intake to established goals, increasing vegetables, and increasing lower glycemic fruits, incorporating whole foods.   Additional resources provided today: Handout on balanced plate concepts.  , Handout on complex carbohydrates and lean sources of protein, and Handout on reduced calorie nutrition plan concepts  Evidence-based interventions for health behavior change were utilized today including  the discussion of self monitoring techniques, problem-solving barriers and SMART goal setting techniques.   Regarding patient's less desirable eating habits and patterns, we employed the technique of small changes.   Pt will specifically work on: n/a   Recommended Physical Activity Goals Troy Peterson has been advised to work up to 300-450 minutes of moderate intensity aerobic activity a week and strengthening exercises 2-3 times per week for cardiovascular health, weight loss maintenance and preservation of muscle mass.   He has agreed to : weight lift 3 days a week to include leg exercises.    Pharmacotherapy We both agreed to :  continue same regimen   FOR ASSOCIATED CONDITIONS ADDRESSED TODAY:  OSA (obstructive sleep apnea) Assessment & Plan: Met w/ Dr.Kurian of pulmonology yesterday. Had a repeat HST on 08/29/2023 that showed no significant sleep apnea. He is now off CPAP as Dr.Kurian felt it was no longer needed. Continue weight loss efforts and f/up with pulmonary as directed.   Pre-diabetes Assessment & Plan: Most recent A1c and fasting insulin :  Lab Results  Component Value Date   HGBA1C 5.7 (H) 07/31/2023   HGBA1C 5.5 02/23/2023   HGBA1C 5.5 09/14/2022   INSULIN  11.1 02/23/2023   INSULIN  7.2 09/14/2022   INSULIN  7.2 12/20/2021    Reports compliance with Metformin  500 mg 1 po with breakfast, 1 po with lunch, and 2 po with dinner. Denies gastrointestinal issues. States his sweet cravings and hunger are controlled. Continue regimen. Troy Peterson will also  continue to work  on weight loss, exercise, via their meal plan we devised to help decrease the risk of progressing to diabetes.    Vitamin D  deficiency Assessment & Plan: Most recent Vitamin D :  Lab Results  Component Value Date   VD25OH 46.1 07/31/2023   VD25OH 54.3 02/23/2023   VD25OH 48.8 09/14/2022   Currently on ergocalciferol  50,000 units weekly without any adverse effects such as nausea, vomiting or muscle weakness.  Continue vitamin D  supplementation - recheck future.    Follow up:   Return 11/09/2023. He was informed of the importance of frequent follow up visits to maximize his success with intensive lifestyle modifications for his multiple health conditions.  Subjective:   Chief complaint: Obesity Troy Peterson is here to discuss his progress with his obesity treatment plan. He is  keeping a food journal and adhering to recommended goals of 1900 calories and 150+ g of protein and states he is following his eating plan approximately 90% of the time. He states he is walking 30 minutes 5 days per week.  Interval History:  Troy Peterson is here for a follow up office visit. Since last OV on 09/14/2023, Troy Peterson is down 2 lbs. States he has been eating less for dinner and moving his portions to breakfast and lunch. Is drinking 8-9 bottles of water daily. Endorses eating "really off-plan foods" during Easter; still feels a bit bloated and achy from this.   Pharmacotherapy that aid with weight loss: He is currently taking Metformin  500 mg 1 po with breakfast and 1 po lunch; and 2 po dinner.  Review of Systems:  Pertinent positives were addressed with patient today.Reviewed by clinician on day of visit: allergies, medications, problem list, medical history, surgical history, family history, social history, and previous encounter notes.  Weight Summary and Biometrics   Weight Lost Since Last Visit: 2lb  Weight Gained Since Last Visit: 0lb   Vitals Temp: 98 F (36.7 C) BP: 124/80 Pulse Rate: (!) 57 SpO2: 99 %   Anthropometric Measurements Height: 6\' 3"  (1.905 m) Weight: 270 lb (122.5 kg) BMI (Calculated): 33.75 Weight at Last Visit: 272lb Weight Lost Since Last Visit: 2lb Weight Gained Since Last Visit: 0lb Starting Weight: 253lb Total Weight Loss (lbs): 83 lb (37.6 kg)   Body Composition  Body Fat %: 32.1 % Fat Mass (lbs): 86.8 lbs Muscle Mass (lbs): 174.8 lbs Total Body Water (lbs): 135.6  lbs Visceral Fat Rating : 16   Other Clinical Data Fasting: No Labs: No Today's Visit #: 38 Starting Date: 11/07/20   Objective:   PHYSICAL EXAM: Blood pressure 124/80, pulse (!) 57, temperature 98 F (36.7 C), height 6\' 3"  (1.905 m), weight 270 lb (122.5 kg), SpO2 99%. Body mass index is 33.75 kg/m.  General: he is overweight, cooperative and in no acute distress. PSYCH: Has normal mood, affect and thought process.   HEENT: EOMI, sclerae are anicteric. Lungs: Normal breathing effort, no conversational dyspnea. Extremities: Moves * 4 Neurologic: A and O * 3, good insight  DIAGNOSTIC DATA REVIEWED: BMET    Component Value Date/Time   NA 143 07/31/2023 0917   K 5.0 07/31/2023 0917   CL 102 07/31/2023 0917   CO2 20 07/31/2023 0917   GLUCOSE 94 07/31/2023 0917   GLUCOSE 107 (H) 07/30/2020 1700   BUN 17 07/31/2023 0917   CREATININE 0.78 07/31/2023 0917   CALCIUM 9.5 07/31/2023 0917   GFRNONAA >60 02/08/2008 0445   GFRAA  02/08/2008 0445    >60  The eGFR has been calculated using the MDRD equation. This calculation has not been validated in all clinical   Lab Results  Component Value Date   HGBA1C 5.7 (H) 07/31/2023   HGBA1C 6.0 (H) 10/28/2020   Lab Results  Component Value Date   INSULIN  11.1 02/23/2023   INSULIN  17.7 10/28/2020   Lab Results  Component Value Date   TSH 1.100 09/14/2022   CBC    Component Value Date/Time   WBC 11.0 (H) 07/30/2020 1700   RBC 4.97 07/30/2020 1700   HGB 13.7 07/30/2020 1700   HCT 41.4 07/30/2020 1700   PLT 256.0 07/30/2020 1700   MCV 83.3 07/30/2020 1700   MCHC 33.1 07/30/2020 1700   RDW 13.9 07/30/2020 1700   Iron Studies No results found for: "IRON", "TIBC", "FERRITIN", "IRONPCTSAT" Lipid Panel     Component Value Date/Time   CHOL 137 02/23/2023 0911   TRIG 97 02/23/2023 0911   HDL 42 02/23/2023 0911   CHOLHDL 3.3 02/23/2023 0911   CHOLHDL 4 07/30/2020 1700   VLDL 16.6 07/30/2020 1700   LDLCALC 77  02/23/2023 0911   LDLDIRECT 102.0 07/25/2016 1600   Hepatic Function Panel     Component Value Date/Time   PROT 7.0 07/31/2023 0917   ALBUMIN 4.5 07/31/2023 0917   AST 20 07/31/2023 0917   ALT 19 07/31/2023 0917   ALKPHOS 76 07/31/2023 0917   BILITOT 0.5 07/31/2023 0917      Component Value Date/Time   TSH 1.100 09/14/2022 1659   Nutritional Lab Results  Component Value Date   VD25OH 46.1 07/31/2023   VD25OH 54.3 02/23/2023   VD25OH 48.8 09/14/2022    Attestations:   I, Special Puri , acting as a Stage manager for [employer], DO., have compiled all relevant documentation for today's office visit on behalf of Marceil Sensor, DO, while in the presence of [employer], DO.  I have spent 45 minutes in the care of the patient today. Specifically,  2 minutes was spent before the visit reviewing the chart. 35 minutes was spent in direct face to face counseling regarding his nutritional data from his tracking app, exercise goals, discussion of labs, and review of his prior OV notes with other providers. This was all in addition to the counseling documented above. 8 minutes was spent after the visit on additional documentation and chart review.    I have reviewed the above documentation for accuracy and completeness, and I agree with the above. Troy Peterson, D.O.  The 21st Century Cures Act was signed into law in 2016 which includes the topic of electronic health records.  This provides immediate access to information in MyChart.  This includes consultation notes, operative notes, office notes, lab results and pathology reports.  If you have any questions about what you read please let us  know at your next visit so we can discuss your concerns and take corrective action if need be.  We are right here with you.

## 2023-11-09 ENCOUNTER — Ambulatory Visit (INDEPENDENT_AMBULATORY_CARE_PROVIDER_SITE_OTHER): Admitting: Family Medicine

## 2023-11-09 ENCOUNTER — Encounter (INDEPENDENT_AMBULATORY_CARE_PROVIDER_SITE_OTHER): Payer: Self-pay | Admitting: Family Medicine

## 2023-11-09 VITALS — BP 111/73 | HR 64 | Temp 98.1°F | Ht 75.0 in | Wt 269.0 lb

## 2023-11-09 DIAGNOSIS — E669 Obesity, unspecified: Secondary | ICD-10-CM | POA: Diagnosis not present

## 2023-11-09 DIAGNOSIS — I10 Essential (primary) hypertension: Secondary | ICD-10-CM

## 2023-11-09 DIAGNOSIS — Z6833 Body mass index (BMI) 33.0-33.9, adult: Secondary | ICD-10-CM

## 2023-11-09 DIAGNOSIS — R7303 Prediabetes: Secondary | ICD-10-CM | POA: Diagnosis not present

## 2023-11-09 DIAGNOSIS — Z6835 Body mass index (BMI) 35.0-35.9, adult: Secondary | ICD-10-CM

## 2023-11-09 MED ORDER — LOSARTAN POTASSIUM-HCTZ 50-12.5 MG PO TABS: ORAL_TABLET | ORAL | 0 refills | Status: DC

## 2023-11-09 NOTE — Progress Notes (Signed)
 Troy Peterson, D.O.  ABFM, ABOM Specializing in Clinical Bariatric Medicine  Office located at: [address] [place], [place]  [address]   Assessment and Plan:   Medications Discontinued During This Encounter  Medication Reason   losartan -hydrochlorothiazide (HYZAAR) 50-12.5 MG tablet Reorder    Meds ordered this encounter  Medications   losartan -hydrochlorothiazide (HYZAAR) 50-12.5 MG tablet    Sig: 0.5 tab q am    Dispense:  45 tablet    Refill:  0    FOR THE DISEASE OF OBESITY:  BMI 35.0-35.9,adult - Current BMI 33.62 Obesity, Beginning BMI 47.25 Assessment & Plan: Since last office visit on 10/19/2023 patient's  Muscle mass has increased by 2.4 lb. Fat mass has decreased by 3.4 lb. Total body water has decreased by 0.2 lb.  Counseling done on how various foods will affect these numbers and how to maximize success  Total lbs lost to date: 84 lbs Total weight loss percentage to date: 23.80%    Recommended Dietary Goals Troy Peterson is currently in the action stage of change. As such, his goal is to continue weight management plan.  He has agreed to: continue current plan   Behavioral Intervention We discussed the following today: adaptive thermogenesis, continue to work on implementation of reduced calorie nutritional plan  Additional resources provided today: None  Evidence-based interventions for health behavior change were utilized today including the discussion of self monitoring techniques, problem-solving barriers and SMART goal setting techniques.   Regarding patient's less desirable eating habits and patterns, we employed the technique of small changes.   Pt will specifically work on being consistent with his daily calorie intake.    Recommended Physical Activity Goals Troy Peterson has been advised to work up to 300-450 minutes of moderate intensity aerobic activity a week and strengthening exercises 2-3 times per week for cardiovascular health, weight loss  maintenance and preservation of muscle mass.   He has agreed to continue to gradually increase the amount and intensity of exercise routine   Pharmacotherapy We both agreed to continue same regimen.    ASSOCIATED CONDITIONS ADDRESSED TODAY:  Essential hypertension Assessment & Plan: Last 3 blood pressure readings in our office are as follows: BP Readings from Last 3 Encounters:  11/09/23 111/73  10/19/23 124/80  10/18/23 118/80   The 10-year ASCVD risk score (Arnett DK, et al., 2019) is: 1.1%  Lab Results  Component Value Date   CREATININE 0.78 07/31/2023   Blood pressure is well-controlled on Losartan -hydrochlorothiazide.Continue adherence to antihypertensive therapy and low sodium diet, advance exercise as able.    Pre-diabetes Assessment & Plan: Most recent A1c and fasting insulin :  Lab Results  Component Value Date   HGBA1C 5.7 (H) 07/31/2023   HGBA1C 5.5 02/23/2023   HGBA1C 5.5 09/14/2022   INSULIN  11.1 02/23/2023   INSULIN  7.2 09/14/2022   INSULIN  7.2 12/20/2021    On a regimen of Metformin  500 mg 1 po with breakfast, 1 po with lunch, and 2 po with dinner with reported good compliance and tolerance. His hunger and cravings are controlled. Continue Metformin  and reduced calorie meal plan low on processed crabs and simple sugars.    Follow up:   Return 12/13/2023 at 3:40 PM. He was informed of the importance of frequent follow up visits to maximize his success with intensive lifestyle modifications for his multiple health conditions.  Subjective:   Chief complaint: Obesity Troy Peterson is here to discuss his progress with his obesity treatment plan. He is keeping a food journal  and adhering to recommended goals of 1900 calories and 150+ g of protein and states he is following his eating plan approximately 95% of the time. He states he is walking 30 minutes 3 days per week and just started back with wt lifting this week.   Interval History:  Troy Peterson is here for a  follow up office visit. Since last OV on 10/19/23, Troy Peterson is down 1 lb. Reviewed his food journaling log. He has no issues getting in his protein. The majority of the week he is under in his calories (1153, 1136, 1560, 1335 etc). On the weekends he typically eats "off-plan" and will not track. His hunger and cravings are controlled.  Pharmacotherapy that aid with weight loss: He is currently taking Metformin  500 mg four times daily.   Review of Systems:  Pertinent positives were addressed with patient today.  Reviewed by clinician on day of visit: allergies, medications, problem list, medical history, surgical history, family history, social history, and previous encounter notes.  Weight Summary and Biometrics   Weight Lost Since Last Visit: 1lb  Weight Gained Since Last Visit: 0   Vitals Temp: 98.1 F (36.7 C) BP: 111/73 Pulse Rate: 64 SpO2: 98 %   Anthropometric Measurements Height: 6\' 3"  (1.905 m) Weight: 269 lb (122 kg) BMI (Calculated): 33.62 Weight at Last Visit: 270lb Weight Lost Since Last Visit: 1lb Weight Gained Since Last Visit: 0 Starting Weight: 353lb Total Weight Loss (lbs): 84 lb (38.1 kg)   Body Composition  Body Fat %: 30.9 % Fat Mass (lbs): 83.4 lbs Muscle Mass (lbs): 177.2 lbs Total Body Water (lbs): 135.4 lbs Visceral Fat Rating : 15   Other Clinical Data Fasting: no Labs: no Today's Visit #: 39 Starting Date: 11/07/20   Objective:   PHYSICAL EXAM: Blood pressure 111/73, pulse 64, temperature 98.1 F (36.7 C), height 6\' 3"  (1.905 m), weight 269 lb (122 kg), SpO2 98%. Body mass index is 33.62 kg/m.  General: he is overweight, cooperative and in no acute distress. PSYCH: Has normal mood, affect and thought process.   HEENT: EOMI, sclerae are anicteric. Lungs: Normal breathing effort, no conversational dyspnea. Extremities: Moves * 4 Neurologic: A and O * 3, good insight  DIAGNOSTIC DATA REVIEWED: BMET    Component Value Date/Time    NA 143 07/31/2023 0917   K 5.0 07/31/2023 0917   CL 102 07/31/2023 0917   CO2 20 07/31/2023 0917   GLUCOSE 94 07/31/2023 0917   GLUCOSE 107 (H) 07/30/2020 1700   BUN 17 07/31/2023 0917   CREATININE 0.78 07/31/2023 0917   CALCIUM 9.5 07/31/2023 0917   GFRNONAA >60 02/08/2008 0445   GFRAA  02/08/2008 0445    >60        The eGFR has been calculated using the MDRD equation. This calculation has not been validated in all clinical   Lab Results  Component Value Date   HGBA1C 5.7 (H) 07/31/2023   HGBA1C 6.0 (H) 10/28/2020   Lab Results  Component Value Date   INSULIN  11.1 02/23/2023   INSULIN  17.7 10/28/2020   Lab Results  Component Value Date   TSH 1.100 09/14/2022   CBC    Component Value Date/Time   WBC 11.0 (H) 07/30/2020 1700   RBC 4.97 07/30/2020 1700   HGB 13.7 07/30/2020 1700   HCT 41.4 07/30/2020 1700   PLT 256.0 07/30/2020 1700   MCV 83.3 07/30/2020 1700   MCHC 33.1 07/30/2020 1700   RDW 13.9 07/30/2020 1700  Iron Studies No results found for: "IRON", "TIBC", "FERRITIN", "IRONPCTSAT" Lipid Panel     Component Value Date/Time   CHOL 137 02/23/2023 0911   TRIG 97 02/23/2023 0911   HDL 42 02/23/2023 0911   CHOLHDL 3.3 02/23/2023 0911   CHOLHDL 4 07/30/2020 1700   VLDL 16.6 07/30/2020 1700   LDLCALC 77 02/23/2023 0911   LDLDIRECT 102.0 07/25/2016 1600   Hepatic Function Panel     Component Value Date/Time   PROT 7.0 07/31/2023 0917   ALBUMIN 4.5 07/31/2023 0917   AST 20 07/31/2023 0917   ALT 19 07/31/2023 0917   ALKPHOS 76 07/31/2023 0917   BILITOT 0.5 07/31/2023 0917      Component Value Date/Time   TSH 1.100 09/14/2022 1659   Nutritional Lab Results  Component Value Date   VD25OH 46.1 07/31/2023   VD25OH 54.3 02/23/2023   VD25OH 48.8 09/14/2022    Attestations:   I, Troy Peterson, acting as a Stage manager for [employer], DO., have compiled all relevant documentation for today's office visit on behalf of Troy Sensor, DO,  while in the presence of [employer], DO.  I have reviewed the above documentation for accuracy and completeness, and I agree with the above. Troy Peterson, D.O.  The 21st Century Cures Act was signed into law in 2016 which includes the topic of electronic health records.  This provides immediate access to information in MyChart.  This includes consultation notes, operative notes, office notes, lab results and pathology reports.  If you have any questions about what you read please let us  know at your next visit so we can discuss your concerns and take corrective action if need be.  We are right here with you.

## 2023-12-13 ENCOUNTER — Ambulatory Visit (INDEPENDENT_AMBULATORY_CARE_PROVIDER_SITE_OTHER): Admitting: Family Medicine

## 2023-12-28 ENCOUNTER — Other Ambulatory Visit (INDEPENDENT_AMBULATORY_CARE_PROVIDER_SITE_OTHER): Payer: Self-pay | Admitting: Family Medicine

## 2023-12-28 DIAGNOSIS — R7303 Prediabetes: Secondary | ICD-10-CM

## 2024-01-04 ENCOUNTER — Encounter (INDEPENDENT_AMBULATORY_CARE_PROVIDER_SITE_OTHER): Payer: Self-pay | Admitting: Family Medicine

## 2024-01-04 ENCOUNTER — Ambulatory Visit (INDEPENDENT_AMBULATORY_CARE_PROVIDER_SITE_OTHER): Admitting: Family Medicine

## 2024-01-04 VITALS — BP 103/68 | HR 65 | Temp 97.8°F | Ht 75.0 in | Wt 272.0 lb

## 2024-01-04 DIAGNOSIS — R7303 Prediabetes: Secondary | ICD-10-CM

## 2024-01-04 DIAGNOSIS — Z6834 Body mass index (BMI) 34.0-34.9, adult: Secondary | ICD-10-CM

## 2024-01-04 DIAGNOSIS — K5900 Constipation, unspecified: Secondary | ICD-10-CM

## 2024-01-04 DIAGNOSIS — E559 Vitamin D deficiency, unspecified: Secondary | ICD-10-CM

## 2024-01-04 DIAGNOSIS — I10 Essential (primary) hypertension: Secondary | ICD-10-CM | POA: Diagnosis not present

## 2024-01-04 DIAGNOSIS — Z8669 Personal history of other diseases of the nervous system and sense organs: Secondary | ICD-10-CM | POA: Insufficient documentation

## 2024-01-04 DIAGNOSIS — E669 Obesity, unspecified: Secondary | ICD-10-CM

## 2024-01-04 MED ORDER — VITAMIN D (ERGOCALCIFEROL) 1.25 MG (50000 UNIT) PO CAPS: 50000.0000 [IU] | ORAL_CAPSULE | ORAL | 0 refills | Status: DC

## 2024-01-05 NOTE — Progress Notes (Signed)
 Troy Peterson, D.O.  ABFM, ABOM Specializing in Clinical Bariatric Medicine  Office located at: [address] [place], [place]  [address]   Assessment and Plan:  No orders of the defined types were placed in this encounter.   Medications Discontinued During This Encounter  Medication Reason   Vitamin D , Ergocalciferol , (DRISDOL ) 1.25 MG (50000 UNIT) CAPS capsule Reorder     Meds ordered this encounter  Medications   Vitamin D , Ergocalciferol , (DRISDOL ) 1.25 MG (50000 UNIT) CAPS capsule    Sig: Take 1 capsule (50,000 Units total) by mouth every 7 (seven) days.    Dispense:  4 capsule    Refill:  0      FOR THE DISEASE OF OBESITY:  Vitamin D  deficiency -     Vitamin D  (Ergocalciferol ); Take 1 capsule (50,000 Units total) by mouth every 7 (seven) days.  Dispense: 4 capsule; Refill: 0  Constipation, unspecified constipation type  Essential hypertension  Pre-diabetes  Obesity, Beginning BMI 47.25  BMI 34.0-34.9,adult - Current BMI 34.00     Since last office visit on *** patient's muscle mass has {DID:29233} by ***lbs. Fat mass has {DID:29233} by ***lbs. Total body water has {DID:29233} by ***lbs.  Counseling done on how various foods will affect these numbers and how to maximize success  Total lbs lost to date: *** Total weight loss percentage to date: ***    Recommended Dietary Goals Troy Peterson is currently in the action stage of change. As such, his goal is to continue weight management plan.  He has agreed to: {EMWTLOSSPLAN:29297::continue current plan}   Behavioral Intervention We discussed the following today: {dowtlossstrategies:31654}  Additional resources provided today: {DOhandouts:31655::None}  Evidence-based interventions for health behavior change were utilized today including the discussion of self monitoring techniques, problem-solving barriers and SMART goal setting techniques.   Regarding patient's less desirable eating habits and  patterns, we employed the technique of small changes.   Pt will specifically work on: ***    Recommended Physical Activity Goals [place] has been advised to work up to 300-450 minutes of moderate intensity aerobic activity a week and strengthening exercises 2-3 times per week for cardiovascular health, weight loss maintenance and preservation of muscle mass.   He has agreed to: {EMEXERCISE:28847::Think about enjoyable ways to increase daily physical activity and overcoming barriers to exercise,Increase physical activity in their day and reduce sedentary time (increase NEAT).}   Pharmacotherapy We both agreed to: {EMagreedrx:29170}   ASSOCIATED CONDITIONS ADDRESSED TODAY:  Vitamin D  deficiency -     Vitamin D  (Ergocalciferol ); Take 1 capsule (50,000 Units total) by mouth every 7 (seven) days.  Dispense: 4 capsule; Refill: 0  Constipation, unspecified constipation type  Essential hypertension  Pre-diabetes  Obesity, Beginning BMI 47.25  BMI 34.0-34.9,adult - Current BMI 34.00      Follow up:   Return in about 6 weeks (around 02/15/2024).*** He was informed of the importance of frequent follow up visits to maximize his success with intensive lifestyle modifications for his multiple health conditions.   Subjective:   Chief complaint: Obesity Troy Peterson is here to discuss his progress with his obesity treatment plan. He is on {MWMwtlossportion/plan2:23431} and states he is following his eating plan approximately ***% of the time. He states he is *** minutes *** days per week OR  *** not exercising (delete one)  Interval History:  Troy Peterson is here for a follow up office visit. Since last OV on *** , he is ***.      (  Use this table if you want, if not delete)   Meal Content  1st meal Time: *** Food choice: *** Beverage: ***  2nd Meal Time: *** Food choice: *** Beverage: ***  3rd Meal Time: *** Food choice: *** Beverage: ***  Snacks/Desserts AM: *** PM:  *** Evening: ***  Oral Fluids *** ETOH: ***       Pharmacotherapy that aid with weight loss: He is currently taking {EMPharmaco:28845}.   Review of Systems:  Pertinent positives were addressed with patient today.  Reviewed by clinician on day of visit: allergies, medications, problem list, medical history, surgical history, family history, social history, and previous encounter notes.  Weight Summary and Biometrics   Weight Lost Since Last Visit: 0lb  Weight Gained Since Last Visit: 3lb  ***  Vitals Temp: 97.8 F (36.6 C) BP: 103/68 Pulse Rate: 65 SpO2: 98 %   Anthropometric Measurements Height: 6' 3 (1.905 m) Weight: 272 lb (123.4 kg) BMI (Calculated): 34 Weight at Last Visit: 269lb Weight Lost Since Last Visit: 0lb Weight Gained Since Last Visit: 3lb Starting Weight: 353lb Total Weight Loss (lbs): 81 lb (36.7 kg)   Body Composition  Body Fat %: 31.3 % Fat Mass (lbs): 85.2 lbs Muscle Mass (lbs): 177.8 lbs Total Body Water (lbs): 136.2 lbs Visceral Fat Rating : 15   Other Clinical Data Fasting: No Labs: no Today's Visit #: 40 Starting Date: 11/07/20 Comments: j 1900cal/ 150+    Objective:   PHYSICAL EXAM: Blood pressure 103/68, pulse 65, temperature 97.8 F (36.6 C), height 6' 3 (1.905 m), weight 272 lb (123.4 kg), SpO2 98%. Body mass index is 34 kg/m.  General: he is overweight, cooperative and in no acute distress. PSYCH: Has normal mood, affect and thought process.   HEENT: EOMI, sclerae are anicteric. Lungs: Normal breathing effort, no conversational dyspnea. Extremities: Moves * 4 Neurologic: A and O * 3, good insight  DIAGNOSTIC DATA REVIEWED: BMET    Component Value Date/Time   NA 143 07/31/2023 0917   K 5.0 07/31/2023 0917   CL 102 07/31/2023 0917   CO2 20 07/31/2023 0917   GLUCOSE 94 07/31/2023 0917   GLUCOSE 107 (H) 07/30/2020 1700   BUN 17 07/31/2023 0917   CREATININE 0.78 07/31/2023 0917   CALCIUM 9.5 07/31/2023 0917    GFRNONAA >60 02/08/2008 0445   GFRAA  02/08/2008 0445    >60        The eGFR has been calculated using the MDRD equation. This calculation has not been validated in all clinical   Lab Results  Component Value Date   HGBA1C 5.7 (H) 07/31/2023   HGBA1C 6.0 (H) 10/28/2020   Lab Results  Component Value Date   INSULIN  11.1 02/23/2023   INSULIN  17.7 10/28/2020   Lab Results  Component Value Date   TSH 1.100 09/14/2022   CBC    Component Value Date/Time   WBC 11.0 (H) 07/30/2020 1700   RBC 4.97 07/30/2020 1700   HGB 13.7 07/30/2020 1700   HCT 41.4 07/30/2020 1700   PLT 256.0 07/30/2020 1700   MCV 83.3 07/30/2020 1700   MCHC 33.1 07/30/2020 1700   RDW 13.9 07/30/2020 1700   Iron Studies No results found for: IRON, TIBC, FERRITIN, IRONPCTSAT Lipid Panel     Component Value Date/Time   CHOL 137 02/23/2023 0911   TRIG 97 02/23/2023 0911   HDL 42 02/23/2023 0911   CHOLHDL 3.3 02/23/2023 0911   CHOLHDL 4 07/30/2020 1700   VLDL 16.6 07/30/2020 1700  LDLCALC 77 02/23/2023 0911   LDLDIRECT 102.0 07/25/2016 1600   Hepatic Function Panel     Component Value Date/Time   PROT 7.0 07/31/2023 0917   ALBUMIN 4.5 07/31/2023 0917   AST 20 07/31/2023 0917   ALT 19 07/31/2023 0917   ALKPHOS 76 07/31/2023 0917   BILITOT 0.5 07/31/2023 0917      Component Value Date/Time   TSH 1.100 09/14/2022 1659   Nutritional Lab Results  Component Value Date   VD25OH 46.1 07/31/2023   VD25OH 54.3 02/23/2023   VD25OH 48.8 09/14/2022    Attestations:   I, ***, acting as a Stage manager for Troy Jenkins, DO., have compiled all relevant documentation for today's office visit on behalf of Troy Jenkins, DO, while in the presence of [employer], DO.  Reviewed by clinician on day of visit: allergies, medications, problem list, medical history, surgical history, family history, social history, and previous encounter notes pertinent to patient's obesity diagnosis. I  have spent 40 *** minutes in the care of the patient today including: preparing to see patient (e.g. review and interpretation of tests, old notes ), obtaining and/or reviewing separately obtained history, performing a medically appropriate examination or evaluation, counseling and educating the patient, ordering medications, test or procedures, documenting clinical information in the electronic or other health care record, and independently interpreting results and communicating results to the patient, family, or caregiver   I have reviewed the above documentation for accuracy and completeness, and I agree with the above. Troy JINNY Peterson, D.O.  The 21st Century Cures Act was signed into law in 2016 which includes the topic of electronic health records.  This provides immediate access to information in MyChart.  This includes consultation notes, operative notes, office notes, lab results and pathology reports.  If you have any questions about what you read please let us  know at your next visit so we can discuss your concerns and take corrective action if need be.  We are right here with you.

## 2024-01-08 ENCOUNTER — Encounter: Admitting: Family Medicine

## 2024-01-31 ENCOUNTER — Ambulatory Visit (INDEPENDENT_AMBULATORY_CARE_PROVIDER_SITE_OTHER): Admitting: Family Medicine

## 2024-01-31 VITALS — BP 127/78 | HR 53 | Temp 97.6°F | Ht 75.0 in | Wt 269.0 lb

## 2024-01-31 DIAGNOSIS — R7303 Prediabetes: Secondary | ICD-10-CM

## 2024-01-31 DIAGNOSIS — Z6833 Body mass index (BMI) 33.0-33.9, adult: Secondary | ICD-10-CM

## 2024-01-31 DIAGNOSIS — E559 Vitamin D deficiency, unspecified: Secondary | ICD-10-CM

## 2024-01-31 DIAGNOSIS — I10 Essential (primary) hypertension: Secondary | ICD-10-CM | POA: Diagnosis not present

## 2024-01-31 DIAGNOSIS — E669 Obesity, unspecified: Secondary | ICD-10-CM

## 2024-01-31 NOTE — Progress Notes (Signed)
 Troy Peterson, D.O.  ABFM, ABOM Specializing in Clinical Bariatric Medicine  Office located at: [address] [place], [place]  [address]     Assessment and Plan:   Orders Placed This Encounter  Procedures   Basic metabolic panel with GFR   Hemoglobin A1c   VITAMIN D  25 Hydroxy (Vit-D Deficiency, Fractures)     FOR THE DISEASE OF OBESITY:  Obesity, Beginning BMI 47.25 BMI 33.0-33.9,adult -- Current BMI 33.62 Assessment & Plan: Since last office visit on 01/04/24 patient's muscle mass has decreased by 1 lbs. Fat mass has decreased by 1.2 lbs. Total body water has decreased by 5.2 lbs.  Body fat % has decreasing by 0.2  %. Counseling done on how various foods will affect these numbers and how to maximize success  Total lbs lost to date: 84  lbs Total weight loss percentage to date: -23.80 %   Recommended Dietary Goals Troy Peterson is currently in the action stage of change. As such, his goal is to continue weight management plan.  He has agreed to: {EMWTLOSSPLAN:29297::continue current plan}   Behavioral Intervention We discussed the following today: {dowtlossstrategies:31654}  Additional resources provided today: Handout on Common Characteristics of Successful Weight Losers and Maintainers   Evidence-based interventions for health behavior change were utilized today including the discussion of self monitoring techniques, problem-solving barriers and SMART goal setting techniques. Regarding patient's less desirable eating habits and patterns, we employed the technique of small changes.   Pt will specifically work on: ***    Recommended Physical Activity Goals Troy Peterson has been advised to work up to 300-450 minutes of moderate intensity aerobic activity a week and strengthening exercises 2-3 times per week for cardiovascular health, weight loss maintenance and preservation of muscle mass.   He has agreed to: {EMEXERCISE:28847::Think about enjoyable ways to increase  daily physical activity and overcoming barriers to exercise,Increase physical activity in their day and reduce sedentary time (increase NEAT).}   Pharmacotherapy We both agreed to: Continue with current nutritional and behavioral strategies and Continue with Metformin  for preDM.   ASSOCIATED CONDITIONS ADDRESSED TODAY:  Pre-diabetes Assessment & Plan: Lab Results  Component Value Date   HGBA1C 5.7 (H) 07/31/2023   HGBA1C 5.5 02/23/2023   HGBA1C 5.5 09/14/2022   INSULIN  11.1 02/23/2023   INSULIN  7.2 09/14/2022   INSULIN  7.2 12/20/2021    Currently taking Metformin  1,000 mg at breakfast and 1,000 mg with dinner. Tolerating well with no adverse SE. He states he gets hungry around 10 pm every night. He is able to satisfy this hunger with an apple. Report occasional constipation.   Rechecking A1c and BMP w GFR today. Will review results at next OV.   -   ***   Essential hypertension Assessment & Plan: BP Readings from Last 3 Encounters:  01/31/24 127/78  01/04/24 103/68  11/09/23 111/73   SBP slightly above goal of 120/80. Currently on Losartan -HCTZ 50-12.5 mg once daily. Reports good compliance/tolerance. Denies any hypertensive/hypotensive episodes.    Continue with antihypertensive regimen.    -   ***   Vitamin D  deficiency Assessment & Plan: Lab Results  Component Value Date   VD25OH 46.1 07/31/2023   VD25OH 54.3 02/23/2023   VD25OH 48.8 09/14/2022   Currently on ERGO 50,000 units once wkly. Good compliance and tolerance. No adverse side effects.    Continue supplementation.   Rechecking vit D today. Will review results at next OV.   -   ***   Follow up:  Return in about 3 weeks (around 02/21/2024) for f/u on 02/21/2024 at 3:40 PM. He was informed of the importance of frequent follow up visits to maximize his success with intensive lifestyle modifications for his multiple health conditions.  Subjective:   Chief complaint: Obesity Troy Peterson  is here to discuss his progress with his obesity treatment plan. He is keeping a food journal and adhering to recommended goals of 1900 calories and 150+ g of protein and states he is following his eating plan approximately 90% of the time. He states he is walking and weight lifting 30 minutes 5 days per week.   Interval History:  Troy Peterson is here for a follow up office visit. Since last OV on 7/10, he is down 3 lbs. He has been journaling and is a lot more consistent than he previously was. Per his journal log, he is averaging around 1900-2000 calories and 185 g of protein per day. He is not getting the recommended 7-9 hours of sleep.      He was previously.   States his clothes are looser.   Struggles with staying on plan on the weekends - *** fried white fish and shrimp from [employer] -- every Friday   He reports going to the [employer] to eat fish on the weekends. He gets   Sweet potatoes *** he has added in navy beans,   Goes to [employer] chicken sandwich, fries, and snack wrap *** every Saturday   Pork chops   Breakfast - [employer] - muffin every Saturday ***        ***    Pharmacotherapy that aid with weight loss: He is currently taking Metformin .    Review of Systems:  Pertinent positives were addressed with patient today.   Reviewed by clinician on day of visit: allergies, medications, problem list, medical history, surgical history, family history, social history, and previous encounter notes.  Weight Summary and Biometrics   Weight Lost Since Last Visit: 3lb  Weight Gained Since Last Visit: 0    Vitals Temp: 97.6 F (36.4 C) BP: 127/78 Pulse Rate: (!) 53 SpO2: 98 %   Anthropometric Measurements Height: 6' 3 (1.905 m) Weight: 269 lb (122 kg) BMI (Calculated): 33.62 Weight at Last Visit: 272lb Weight Lost Since Last Visit: 3lb Weight Gained Since Last Visit: 0 Starting Weight: 353lb Total Weight Loss (lbs): 84 lb (38.1  kg)   Body Composition  Body Fat %: 31.1 % Fat Mass (lbs): 84 lbs Muscle Mass (lbs): 176.8 lbs Total Body Water (lbs): 131 lbs Visceral Fat Rating : 15   Other Clinical Data Fasting: No Labs: No Today's Visit #: 41 Starting Date: 11/07/20    Objective:   PHYSICAL EXAM: Blood pressure 127/78, pulse (!) 53, temperature 97.6 F (36.4 C), height 6' 3 (1.905 m), weight 269 lb (122 kg), SpO2 98%. Body mass index is 33.62 kg/m.  General: he is overweight, cooperative and in no acute distress. PSYCH: Has normal mood, affect and thought process.   HEENT: EOMI, sclerae are anicteric. Lungs: Normal breathing effort, no conversational dyspnea. Extremities: Moves * 4 Neurologic: A and O * 3, good insight  DIAGNOSTIC DATA REVIEWED: BMET    Component Value Date/Time   NA 143 07/31/2023 0917   K 5.0 07/31/2023 0917   CL 102 07/31/2023 0917   CO2 20 07/31/2023 0917   GLUCOSE 94 07/31/2023 0917   GLUCOSE 107 (H) 07/30/2020 1700   BUN 17 07/31/2023 0917   CREATININE 0.78  07/31/2023 0917   CALCIUM 9.5 07/31/2023 0917   GFRNONAA >60 02/08/2008 0445   GFRAA  02/08/2008 0445    >60        The eGFR has been calculated using the MDRD equation. This calculation has not been validated in all clinical   Lab Results  Component Value Date   HGBA1C 5.7 (H) 07/31/2023   HGBA1C 6.0 (H) 10/28/2020   Lab Results  Component Value Date   INSULIN  11.1 02/23/2023   INSULIN  17.7 10/28/2020   Lab Results  Component Value Date   TSH 1.100 09/14/2022   CBC    Component Value Date/Time   WBC 11.0 (H) 07/30/2020 1700   RBC 4.97 07/30/2020 1700   HGB 13.7 07/30/2020 1700   HCT 41.4 07/30/2020 1700   PLT 256.0 07/30/2020 1700   MCV 83.3 07/30/2020 1700   MCHC 33.1 07/30/2020 1700   RDW 13.9 07/30/2020 1700   Iron Studies No results found for: IRON, TIBC, FERRITIN, IRONPCTSAT Lipid Panel     Component Value Date/Time   CHOL 137 02/23/2023 0911   TRIG 97 02/23/2023 0911    HDL 42 02/23/2023 0911   CHOLHDL 3.3 02/23/2023 0911   CHOLHDL 4 07/30/2020 1700   VLDL 16.6 07/30/2020 1700   LDLCALC 77 02/23/2023 0911   LDLDIRECT 102.0 07/25/2016 1600   Hepatic Function Panel     Component Value Date/Time   PROT 7.0 07/31/2023 0917   ALBUMIN 4.5 07/31/2023 0917   AST 20 07/31/2023 0917   ALT 19 07/31/2023 0917   ALKPHOS 76 07/31/2023 0917   BILITOT 0.5 07/31/2023 0917      Component Value Date/Time   TSH 1.100 09/14/2022 1659   Nutritional Lab Results  Component Value Date   VD25OH 46.1 07/31/2023   VD25OH 54.3 02/23/2023   VD25OH 48.8 09/14/2022    Attestations:   I, Vernell Forest, acting as a Stage manager for Troy Jenkins, DO., have compiled all relevant documentation for today's office visit on behalf of Troy Jenkins, DO, while in the presence of [employer], DO.  I have reviewed the above documentation for accuracy and completeness, and I agree with the above. Troy JINNY Peterson, D.O.  The 21st Century Cures Act was signed into law in 2016 which includes the topic of electronic health records.  This provides immediate access to information in MyChart.  This includes consultation notes, operative notes, office notes, lab results and pathology reports.  If you have any questions about what you read please let us  know at your next visit so we can discuss your concerns and take corrective action if need be.  We are right here with you.

## 2024-02-01 LAB — BASIC METABOLIC PANEL WITH GFR
BUN/Creatinine Ratio: 25 — ABNORMAL HIGH (ref 9–20)
BUN: 24 mg/dL (ref 6–24)
CO2: 21 mmol/L (ref 20–29)
Calcium: 10.1 mg/dL (ref 8.7–10.2)
Chloride: 99 mmol/L (ref 96–106)
Creatinine, Ser: 0.95 mg/dL (ref 0.76–1.27)
Glucose: 75 mg/dL (ref 70–99)
Potassium: 4.1 mmol/L (ref 3.5–5.2)
Sodium: 138 mmol/L (ref 134–144)
eGFR: 101 mL/min/1.73 (ref >59)

## 2024-02-01 LAB — VITAMIN D 25 HYDROXY (VIT D DEFICIENCY, FRACTURES): Vit D, 25-Hydroxy: 67.4 ng/mL (ref 30.0–100.0)

## 2024-02-01 LAB — HEMOGLOBIN A1C
Est. average glucose Bld gHb Est-mCnc: 108 mg/dL
Hgb A1c MFr Bld: 5.4 % (ref 4.8–5.6)

## 2024-02-05 ENCOUNTER — Encounter: Payer: Self-pay | Admitting: Family Medicine

## 2024-02-05 ENCOUNTER — Ambulatory Visit (INDEPENDENT_AMBULATORY_CARE_PROVIDER_SITE_OTHER): Admitting: Family Medicine

## 2024-02-05 VITALS — BP 118/70 | HR 62 | Temp 98.1°F | Ht 74.0 in | Wt 281.8 lb

## 2024-02-05 DIAGNOSIS — Z0000 Encounter for general adult medical examination without abnormal findings: Secondary | ICD-10-CM

## 2024-02-05 DIAGNOSIS — I10 Essential (primary) hypertension: Secondary | ICD-10-CM

## 2024-02-05 DIAGNOSIS — Z1211 Encounter for screening for malignant neoplasm of colon: Secondary | ICD-10-CM

## 2024-02-05 DIAGNOSIS — Z7189 Other specified counseling: Secondary | ICD-10-CM

## 2024-02-05 MED ORDER — LOSARTAN POTASSIUM-HCTZ 50-12.5 MG PO TABS: 1.0000 | ORAL_TABLET | Freq: Every day | ORAL | 3 refills | Status: DC

## 2024-02-05 MED ORDER — LOSARTAN POTASSIUM-HCTZ 50-12.5 MG PO TABS: 1.0000 | ORAL_TABLET | Freq: Every day | ORAL | Status: DC

## 2024-02-05 NOTE — Patient Instructions (Addendum)
 Tetanus shot when possible.  I would get a flu shot each fall.   You should get a call from the GI clinic.  Take care.  Glad to see you.

## 2024-02-05 NOTE — Progress Notes (Signed)
 CPE- See plan.  Routine anticipatory guidance given to patient.  See health maintenance.  The possibility exists that previously documented standard health maintenance information may have been brought forward from a previous encounter into this note.  If needed, that same information has been updated to reflect the current situation based on today's encounter.    Tetanus 2014 Flu 2024 PNA and shingles not due  He had covid vaccine.   Prostate cancer screening not due. D/w patient mz:neupnwd for colon cancer screening, including IFOB vs. colonoscopy.  Risks and benefits of both were discussed and patient voiced understanding.  Pt elects for: colonoscopy. Living will d/w pt.  Wife designated if patient were incapacitated.   Diet and exercise d/w pt.   HIV prev neg per patient report.    Hypertension:    Using medication without problems or lightheadedness: yes Chest pain with exertion:no Edema:no Short of breath:no Labs d/w pt.    He is still taking metformin  and working on diet and exercise.  I thanked him for his effort.    PMH and SH reviewed  Meds, vitals, and allergies reviewed.   ROS: Per HPI.  Unless specifically indicated otherwise in HPI, the patient denies:  General: fever. Eyes: acute vision changes ENT: sore throat Cardiovascular: chest pain Respiratory: SOB GI: vomiting GU: dysuria Musculoskeletal: acute back pain Derm: acute rash Neuro: acute motor dysfunction Psych: worsening mood Endocrine: polydipsia Heme: bleeding Allergy: hayfever  GEN: nad, alert and oriented HEENT: mucous membranes moist NECK: supple w/o LA CV: rrr. PULM: ctab, no inc wob ABD: soft, +bs EXT: no edema SKIN: Well-perfused

## 2024-02-07 NOTE — Assessment & Plan Note (Signed)
 Tetanus 2014 Flu 2024 PNA and shingles not due  He had covid vaccine.   Prostate cancer screening not due. D/w patient mz:neupnwd for colon cancer screening, including IFOB vs. colonoscopy.  Risks and benefits of both were discussed and patient voiced understanding.  Pt elects for: colonoscopy. Living will d/w pt.  Wife designated if patient were incapacitated.   Diet and exercise d/w pt.   HIV prev neg per patient report.

## 2024-02-07 NOTE — Assessment & Plan Note (Signed)
 Living will d/w pt.  Wife designated if patient were incapacitated.   ?

## 2024-02-07 NOTE — Assessment & Plan Note (Signed)
 No change in meds.  Labs d/w pt.    He is still taking metformin  and working on diet and exercise.  I thanked him for his effort.  Continue losartan  hydrochlorothiazide

## 2024-02-21 ENCOUNTER — Ambulatory Visit (INDEPENDENT_AMBULATORY_CARE_PROVIDER_SITE_OTHER): Admitting: Family Medicine

## 2024-02-21 ENCOUNTER — Encounter (INDEPENDENT_AMBULATORY_CARE_PROVIDER_SITE_OTHER): Payer: Self-pay | Admitting: Family Medicine

## 2024-02-21 DIAGNOSIS — R7303 Prediabetes: Secondary | ICD-10-CM

## 2024-02-21 DIAGNOSIS — Z6833 Body mass index (BMI) 33.0-33.9, adult: Secondary | ICD-10-CM

## 2024-02-21 DIAGNOSIS — E669 Obesity, unspecified: Secondary | ICD-10-CM | POA: Diagnosis not present

## 2024-02-21 DIAGNOSIS — E559 Vitamin D deficiency, unspecified: Secondary | ICD-10-CM | POA: Diagnosis not present

## 2024-02-21 DIAGNOSIS — Z6834 Body mass index (BMI) 34.0-34.9, adult: Secondary | ICD-10-CM | POA: Diagnosis not present

## 2024-02-21 MED ORDER — METFORMIN HCL 500 MG PO TABS: ORAL_TABLET | ORAL | 0 refills | Status: DC

## 2024-02-21 MED ORDER — VITAMIN D (ERGOCALCIFEROL) 1.25 MG (50000 UNIT) PO CAPS
50000.0000 [IU] | ORAL_CAPSULE | ORAL | 0 refills | Status: DC
Start: 2024-02-21 — End: 2024-04-22

## 2024-02-21 NOTE — Progress Notes (Signed)
 Troy DOROTHA Peterson, D.O.  ABFM, ABOM Specializing in Clinical Bariatric Medicine  Office located at: [address] [place], [place]  [address]   Assessment and Plan:  No orders of the defined types were placed in this encounter.  Medications Discontinued During This Encounter  Medication Reason   metFORMIN  (GLUCOPHAGE ) 500 MG tablet Reorder   Vitamin D , Ergocalciferol , (DRISDOL ) 1.25 MG (50000 UNIT) CAPS capsule Reorder    Meds ordered this encounter  Medications   metFORMIN  (GLUCOPHAGE ) 500 MG tablet    Sig: 1 po with breakfast and 1 po lunch; and 2 po dinner    Dispense:  120 tablet    Refill:  0   Vitamin D , Ergocalciferol , (DRISDOL ) 1.25 MG (50000 UNIT) CAPS capsule    Sig: Take 1 capsule (50,000 Units total) by mouth every 7 (seven) days.    Dispense:  4 capsule    Refill:  0      FOR THE DISEASE OF OBESITY:  Obesity, Beginning BMI 47.25 BMI 33.0-33.9,adult -- Current BMI 34 Assessment & Plan: Since last office visit on 01/31/24 patient's muscle mass has decreased by 0.6 lbs. Fat mass has increased by 2.8 lbs. Total body water has increased by 6.4 lbs.  Body fat % has increased by 0.8  %. Counseling done on how various foods will affect these numbers and how to maximize success  Total lbs lost to date: 81 lbs Total weight loss percentage to date: -22.95 %   Recommended Dietary Goals Troy Peterson is currently in the action stage of change. As such, his goal is to continue weight management plan.  He has agreed to: continue current plan   Behavioral Intervention We discussed the following today: increasing lean protein intake to established goals, decreasing simple carbohydrates , increasing water intake , keeping healthy foods at home, decreasing eating out or consumption of processed foods, and making healthy choices when eating convenient foods, and celebration eating strategies  Additional resources provided today: None  Evidence-based interventions for health  behavior change were utilized today including the discussion of self monitoring techniques, problem-solving barriers and SMART goal setting techniques.   Regarding patient's less desirable eating habits and patterns, we employed the technique of small changes.   Pt will specifically work on: decreasing fried foods    Recommended Physical Activity Goals Troy Peterson has been advised to work up to 300-450 minutes of moderate intensity aerobic activity a week and strengthening exercises 2-3 times per week for cardiovascular health, weight loss maintenance and preservation of muscle mass.   He has agreed to: Increase physical activity in their day and reduce sedentary time (increase NEAT). Currently doing 10 reps and 4 sets. Increase weights and decrease sets to 2-3    Pharmacotherapy We both agreed to: Continue with current nutritional and behavioral strategies and continue with meds that aid in wt loss (Metformin )   ASSOCIATED CONDITIONS ADDRESSED TODAY:  Pre-diabetes Assessment & Plan: Lab Results  Component Value Date   HGBA1C 5.4 01/31/2024   HGBA1C 5.7 (H) 07/31/2023   HGBA1C 5.5 02/23/2023   INSULIN  11.1 02/23/2023   INSULIN  7.2 09/14/2022   INSULIN  7.2 12/20/2021    CBC    Component Value Date/Time   WBC 11.0 (H) 07/30/2020 1700   RBC 4.97 07/30/2020 1700   HGB 13.7 07/30/2020 1700   HCT 41.4 07/30/2020 1700   PLT 256.0 07/30/2020 1700   MCV 83.3 07/30/2020 1700   MCHC 33.1 07/30/2020 1700   RDW 13.9 07/30/2020 1700  LYMPHSABS 3.1 07/30/2020 1700   MONOABS 0.8 07/30/2020 1700   EOSABS 0.2 07/30/2020 1700   BASOSABS 0.1 07/30/2020 1700   BP at goal. Reviewed labs obtained at [facility]: A1c improved from 5.7 to 5.4. BUN/creatine is elevated; increase from 22 to 25, which suggests decreased hydration status. Serum creatinine has increased from 0.78 to 0.95. Overall kidney labs are grossly normal. Currently on Metformin  - 500 mg at breakfast, 500 mg at dinner, and 1,000 mg at  dinner. Good compliance/tolerance. Drinks 8-11 bottles of water per day. Using Miralax  daily.   Reviewed serum creatinine goal of <1 w pt. Will continue with current med regimen. Will refill Metformin , no dose changes. Continue working on following his nutritional meal plan; increase protein, decrease simple carbs and added sugars. Properly hydrate by drinking half his body weight in ounces of water. Drink more water with increased physical activity or when exercising. or Will continue monitoring.     Vitamin D  deficiency Assessment & Plan: Lab Results  Component Value Date   VD25OH 67.4 01/31/2024   VD25OH 46.1 07/31/2023   VD25OH 54.3 02/23/2023   Reviewed labs obtained at [facility]: Vit D at goal. Currently on ERGO 50 K units once weekly. Good compliance/tolerance with no adverse SE. No acute concerns today.   Ideal vit D range of 50-70 reviewed with pt. Continue with current supplementation. Will refill ERGO, no dose changes.    Follow up:   Return for f/u on 03/11/2024 at 3:40 PM.  He was informed of the importance of frequent follow up visits to maximize his success with intensive lifestyle modifications for his multiple health conditions.  Subjective:   Chief complaint: Obesity Troy Peterson is here to discuss his progress with his obesity treatment plan. He is keeping a food journal and adhering to recommended goals of 1900 calories and 150+ g of protein and states he is following his eating plan approximately 90% of the time. He states he is walking and weight lifting 60 minutes 4-5 days per week.    Interval History:  Troy Peterson is here for a follow up office visit. Since last OV on 01/31/24, he is up 3 lbs. He has been tracking calories/macros at 1900 calories/day and 150+ g of protein. He is eating more whole foods (fruits, veggies, etc,), getting recommended amount of protein, and drinking enough water. He has not been skipping meals. Is sleeping the recommended 7-9 hours per night. He  has been weighing his proteins at.   He is celebrating his birthday today! He plans to eat a cake that his wife planned to buy him. He has noticed his clothes fitting him better.    Pharmacotherapy that aid with weight loss: He is currently taking Metformin  500 mg at breakfast, 500 mg at lunch, and 1,000 mg at dinner.    Review of Systems:  Pertinent positives were addressed with patient today.  Reviewed by clinician on day of visit: allergies, medications, problem list, medical history, surgical history, family history, social history, and previous encounter notes.  Weight Summary and Biometrics   Weight Lost Since Last Visit: 0  Weight Gained Since Last Visit: 3lb    Vitals Temp: 97.9 F (36.6 C) BP: 116/73 Pulse Rate: (!) 58 SpO2: 95 %   Anthropometric Measurements Height: 6' 3 (1.905 m) Weight: 272 lb (123.4 kg) BMI (Calculated): 34 Weight at Last Visit: 269lb Weight Lost Since Last Visit: 0 Weight Gained Since Last Visit: 3lb Starting Weight: 353lb Total Weight Loss (lbs): 81 lb (  36.7 kg)   Body Composition  Body Fat %: 31.9 % Fat Mass (lbs): 86.8 lbs Muscle Mass (lbs): 176.2 lbs Total Body Water (lbs): 137.4 lbs Visceral Fat Rating : 16   Other Clinical Data Fasting: no Labs: no Today's Visit #: 42 Starting Date: 11/07/20    Objective:   PHYSICAL EXAM: Blood pressure 116/73, pulse (!) 58, temperature 97.9 F (36.6 C), height 6' 3 (1.905 m), weight 272 lb (123.4 kg), SpO2 95%. Body mass index is 34 kg/m.  General: he is overweight, cooperative and in no acute distress. PSYCH: Has normal mood, affect and thought process.   HEENT: EOMI, sclerae are anicteric. Lungs: Normal breathing effort, no conversational dyspnea. Extremities: Moves * 4 Neurologic: A and O * 3, good insight  DIAGNOSTIC DATA REVIEWED: BMET    Component Value Date/Time   NA 138 01/31/2024 1554   K 4.1 01/31/2024 1554   CL 99 01/31/2024 1554   CO2 21 01/31/2024 1554    GLUCOSE 75 01/31/2024 1554   GLUCOSE 107 (H) 07/30/2020 1700   BUN 24 01/31/2024 1554   CREATININE 0.95 01/31/2024 1554   CALCIUM 10.1 01/31/2024 1554   GFRNONAA >60 02/08/2008 0445   GFRAA  02/08/2008 0445    >60        The eGFR has been calculated using the MDRD equation. This calculation has not been validated in all clinical   Lab Results  Component Value Date   HGBA1C 5.4 01/31/2024   HGBA1C 6.0 (H) 10/28/2020   Lab Results  Component Value Date   INSULIN  11.1 02/23/2023   INSULIN  17.7 10/28/2020   Lab Results  Component Value Date   TSH 1.100 09/14/2022   CBC    Component Value Date/Time   WBC 11.0 (H) 07/30/2020 1700   RBC 4.97 07/30/2020 1700   HGB 13.7 07/30/2020 1700   HCT 41.4 07/30/2020 1700   PLT 256.0 07/30/2020 1700   MCV 83.3 07/30/2020 1700   MCHC 33.1 07/30/2020 1700   RDW 13.9 07/30/2020 1700   Iron Studies No results found for: IRON, TIBC, FERRITIN, IRONPCTSAT Lipid Panel     Component Value Date/Time   CHOL 137 02/23/2023 0911   TRIG 97 02/23/2023 0911   HDL 42 02/23/2023 0911   CHOLHDL 3.3 02/23/2023 0911   CHOLHDL 4 07/30/2020 1700   VLDL 16.6 07/30/2020 1700   LDLCALC 77 02/23/2023 0911   LDLDIRECT 102.0 07/25/2016 1600   Hepatic Function Panel     Component Value Date/Time   PROT 7.0 07/31/2023 0917   ALBUMIN 4.5 07/31/2023 0917   AST 20 07/31/2023 0917   ALT 19 07/31/2023 0917   ALKPHOS 76 07/31/2023 0917   BILITOT 0.5 07/31/2023 0917      Component Value Date/Time   TSH 1.100 09/14/2022 1659   Nutritional Lab Results  Component Value Date   VD25OH 67.4 01/31/2024   VD25OH 46.1 07/31/2023   VD25OH 54.3 02/23/2023    Attestations:   I, Troy Peterson, acting as a Stage manager for Troy Jenkins, Troy Peterson., have compiled all relevant documentation for today's office visit on behalf of Troy Jenkins, Troy Peterson, while in the presence of [employer], Troy Peterson.  I have reviewed the above documentation for accuracy and  completeness, and I agree with the above. Troy Peterson, D.O.  The 21st Century Cures Act was signed into law in 2016 which includes the topic of electronic health records.  This provides immediate access to information in MyChart.  This includes consultation notes, operative  notes, office notes, lab results and pathology reports.  If you have any questions about what you read please let us  know at your next visit so we can discuss your concerns and take corrective action if need be.  We are right here with you.

## 2024-03-11 ENCOUNTER — Ambulatory Visit (INDEPENDENT_AMBULATORY_CARE_PROVIDER_SITE_OTHER): Admitting: Family Medicine

## 2024-03-21 ENCOUNTER — Other Ambulatory Visit (INDEPENDENT_AMBULATORY_CARE_PROVIDER_SITE_OTHER): Payer: Self-pay | Admitting: Family Medicine

## 2024-03-21 DIAGNOSIS — R7303 Prediabetes: Secondary | ICD-10-CM

## 2024-04-03 ENCOUNTER — Ambulatory Visit (INDEPENDENT_AMBULATORY_CARE_PROVIDER_SITE_OTHER): Admitting: Family Medicine

## 2024-04-22 ENCOUNTER — Encounter: Payer: Self-pay | Admitting: Family Medicine

## 2024-04-22 ENCOUNTER — Ambulatory Visit (INDEPENDENT_AMBULATORY_CARE_PROVIDER_SITE_OTHER): Admitting: Family Medicine

## 2024-04-22 ENCOUNTER — Encounter (INDEPENDENT_AMBULATORY_CARE_PROVIDER_SITE_OTHER): Payer: Self-pay | Admitting: Family Medicine

## 2024-04-22 VITALS — BP 128/81 | HR 56 | Temp 97.5°F | Ht 75.0 in | Wt 281.0 lb

## 2024-04-22 DIAGNOSIS — E559 Vitamin D deficiency, unspecified: Secondary | ICD-10-CM | POA: Diagnosis not present

## 2024-04-22 DIAGNOSIS — R7303 Prediabetes: Secondary | ICD-10-CM | POA: Diagnosis not present

## 2024-04-22 DIAGNOSIS — E669 Obesity, unspecified: Secondary | ICD-10-CM | POA: Diagnosis not present

## 2024-04-22 DIAGNOSIS — Z6833 Body mass index (BMI) 33.0-33.9, adult: Secondary | ICD-10-CM

## 2024-04-22 DIAGNOSIS — Z6835 Body mass index (BMI) 35.0-35.9, adult: Secondary | ICD-10-CM | POA: Diagnosis not present

## 2024-04-22 MED ORDER — METFORMIN HCL 500 MG PO TABS: ORAL_TABLET | ORAL | 1 refills | Status: DC

## 2024-04-22 MED ORDER — VITAMIN D (ERGOCALCIFEROL) 1.25 MG (50000 UNIT) PO CAPS: 50000.0000 [IU] | ORAL_CAPSULE | ORAL | 1 refills | Status: DC

## 2024-04-22 NOTE — Progress Notes (Signed)
 [name], D.O.  ABFM, ABOM Specializing in Clinical Bariatric Medicine  Office located at: [address] [place], [place]  [address]      A) FOR THE CHRONIC DISEASE OF OBESITY:  Chief complaint: Obesity Troy Peterson is here to discuss his progress with his obesity treatment plan.   History of present illness / Interval history:  Troy Peterson is here today for his follow-up office visit.  Since last OV on 02/21/2024, pt is up 9 lbs.    02/21/24 15:00 04/22/24 15:00   Body Fat % 31.9 % 32.2 %  Muscle Mass (lbs) 176.2 lbs 181.4 lbs  Fat Mass (lbs) 86.8 lbs 90.6 lbs  Total Body Water (lbs) 137.4 lbs 141 lbs  Visceral Fat Rating  16 16  Counseling done on how various foods will affect these numbers and how to maximize success   Total lbs lost to date: -72 lbs Total Fat Mass in lbs lost to date: -66.4 Total weight loss percentage to date: -20.40 %   Nutrition Therapy He is keeping a food journal and adhering to recommended goals of 1900 calories and 150+ g of protein  and states he is following his eating plan approximately 85 % of the time.   - Tracking Calories/Macros: yes  - Eating More Whole Foods: yes  - Adequate Protein Intake: no -    - Adequate Water Intake: yes  - Skipping Meals: no -    - Sleeping 7-9 Hours/ Night: yes   Troy Peterson is currently in the action stage of change. As such, his goal is to continue weight management plan.  He has agreed to: continue current plan   Physical Activity Pt is walking or doing weights 30-45  minutes 5 days per week   Troy Peterson has been advised to work up to 300-450 minutes of moderate intensity aerobic activity a week and strengthening exercises 2-3 times per week for cardiovascular health, weight loss maintenance and preservation of muscle mass.  He has agreed to : Think about enjoyable ways to increase daily physical activity and overcoming barriers to exercise, Increase physical activity in their day and reduce  sedentary time (increase NEAT)., Increase volume of physical activity to a goal of 240 minutes a week, and Combine aerobic and strengthening exercises for efficiency and improved cardiometabolic health.   Behavioral Modifications Evidence-based interventions for health behavior change were utilized today including the discussion of  1) self monitoring techniques:  journal 2) problem-solving barriers:  none 3) self care:  none 4) SMART goals for next OV:  none Regarding patient's less desirable eating habits and patterns, we employed the technique of small changes.   We discussed the following today: work on tracking and journaling calories using tracking application and continue to work on implementation of reduced calorie nutritional plan Additional resources provided today: None   Medical Interventions/ Pharmacotherapy Previous Bariatric surgery: none Pharmacotherapy for weight loss: He is currently taking Metformin  500 mg 1 po with breakfast, 1 po lunch, and 2 po dinner for medical weight loss.    We discussed various medication options to help Troy Peterson with his weight loss efforts and we both agreed to : See Pre-DM note.  B) OBESITY RELATED CONDITIONS ADDRESSED TODAY:  Obesity, Beginning BMI 47.25 BMI 33.0-33.9,adult -- Current BMI 35.12  Pre-diabetes Assessment & Plan Lab Results  Component Value Date   HGBA1C 5.4 01/31/2024   HGBA1C 5.7 (H) 07/31/2023   HGBA1C 5.5 02/23/2023   INSULIN  11.1 02/23/2023   INSULIN   7.2 09/14/2022   INSULIN  7.2 12/20/2021  Is prescribed Metformin  500 mg 1 po with breakfast and 1 po lunch; and 2 po dinner. Usually takes Metformin  2 with breakfast and 2 with dinner. Has ran out of the Metformin  recently(refill today)  Pt is taking a friend's Ozempic started on 0.25 mg weekly as advised by his friend using a 2 mg pen, clicking it 9 times. Denies any side effects, but reports increased hunger.  Pt has access to drug as long as he needs. Told pt he  may increase it to 0.5 mg, 18 clicks since the lower dose is ineffective. Pt has no history of pancreatitis, no Fhx of MTC or MEN Type 2. No relative contradictions to the drug.   Pt states that on the weekends he's eating many off-plan foods such as pizza, brownies, and cake,and going to parties. We explained it would be difficult to lose weight if he continues to be off-plan as consistently. Encouraged him to be me more consistent with his MP.  Pt reports he gets in about 200-230 grams of protein. For breakfast he typically has 5 oz of chicken, 4 eggs, and a greek yogurt. Recommended to focus on smaller meals multiple meals throughout the day, because the body is unable to uptake so much protein in one sitting.  No more than 30 grams of protein in on sitting.      Vitamin D  deficiency Assessment & Plan  Lab Results  Component Value Date   VD25OH 67.4 01/31/2024   VD25OH 46.1 07/31/2023   VD25OH 54.3 02/23/2023  Currently on Ergo 50,000 units once weekly. Vit D levels are at goal. No acute concerns. Cont regimen(refill today). Will recheck levels as necessary.      Medications Discontinued During This Encounter  Medication Reason   metFORMIN  (GLUCOPHAGE ) 500 MG tablet Reorder   Vitamin D , Ergocalciferol , (DRISDOL ) 1.25 MG (50000 UNIT) CAPS capsule Reorder     Meds ordered this encounter  Medications   metFORMIN  (GLUCOPHAGE ) 500 MG tablet    Sig: 2 with brkfst and 2 po dinner    Dispense:  120 tablet    Refill:  1   Vitamin D , Ergocalciferol , (DRISDOL ) 1.25 MG (50000 UNIT) CAPS capsule    Sig: Take 1 capsule (50,000 Units total) by mouth every 7 (seven) days.    Dispense:  4 capsule    Refill:  1      Follow up:   Return 05/16/2024 3:20 PM.  He was informed of the importance of frequent follow up visits to maximize his success with intensive lifestyle modifications for his multiple health conditions.   Weight Summary and Biometrics   Weight Lost Since Last Visit:  0lb  Weight Gained Since Last Visit: 9lb    Vitals Temp: (!) 97.5 F (36.4 C) BP: 128/81 Pulse Rate: (!) 56 SpO2: 99 %   Anthropometric Measurements Height: 6' 3 (1.905 m) Weight: 281 lb (127.5 kg) BMI (Calculated): 35.12 Weight at Last Visit: 272lb Weight Lost Since Last Visit: 0lb Weight Gained Since Last Visit: 9lb Starting Weight: 353lb Total Weight Loss (lbs): 72 lb (32.7 kg)   Body Composition  Body Fat %: 32.2 % Fat Mass (lbs): 90.6 lbs Muscle Mass (lbs): 181.4 lbs Total Body Water (lbs): 141 lbs Visceral Fat Rating : 16   Other Clinical Data Fasting: no Labs: no Today's Visit #: 43 Starting Date: 10/28/20    Objective:   PHYSICAL EXAM: Blood pressure 128/81, pulse (!) 56, temperature (!) 97.5 F (36.4  C), height 6' 3 (1.905 m), weight 281 lb (127.5 kg), SpO2 99%. Body mass index is 35.12 kg/m.  General: he is overweight, cooperative and in no acute distress. PSYCH: Has normal mood, affect and thought process.   HEENT: EOMI, sclerae are anicteric. Lungs: Normal breathing effort, no conversational dyspnea. Extremities: Moves * 4 Neurologic: A and O * 3, good insight  DIAGNOSTIC DATA REVIEWED: BMET    Component Value Date/Time   NA 138 01/31/2024 1554   K 4.1 01/31/2024 1554   CL 99 01/31/2024 1554   CO2 21 01/31/2024 1554   GLUCOSE 75 01/31/2024 1554   GLUCOSE 107 (H) 07/30/2020 1700   BUN 24 01/31/2024 1554   CREATININE 0.95 01/31/2024 1554   CALCIUM 10.1 01/31/2024 1554   GFRNONAA >60 02/08/2008 0445   GFRAA  02/08/2008 0445    >60        The eGFR has been calculated using the MDRD equation. This calculation has not been validated in all clinical   Lab Results  Component Value Date   HGBA1C 5.4 01/31/2024   HGBA1C 6.0 (H) 10/28/2020   Lab Results  Component Value Date   INSULIN  11.1 02/23/2023   INSULIN  17.7 10/28/2020   Lab Results  Component Value Date   TSH 1.100 09/14/2022   CBC    Component Value Date/Time    WBC 11.0 (H) 07/30/2020 1700   RBC 4.97 07/30/2020 1700   HGB 13.7 07/30/2020 1700   HCT 41.4 07/30/2020 1700   PLT 256.0 07/30/2020 1700   MCV 83.3 07/30/2020 1700   MCHC 33.1 07/30/2020 1700   RDW 13.9 07/30/2020 1700   Iron Studies No results found for: IRON, TIBC, FERRITIN, IRONPCTSAT Lipid Panel     Component Value Date/Time   CHOL 137 02/23/2023 0911   TRIG 97 02/23/2023 0911   HDL 42 02/23/2023 0911   CHOLHDL 3.3 02/23/2023 0911   CHOLHDL 4 07/30/2020 1700   VLDL 16.6 07/30/2020 1700   LDLCALC 77 02/23/2023 0911   LDLDIRECT 102.0 07/25/2016 1600   Hepatic Function Panel     Component Value Date/Time   PROT 7.0 07/31/2023 0917   ALBUMIN 4.5 07/31/2023 0917   AST 20 07/31/2023 0917   ALT 19 07/31/2023 0917   ALKPHOS 76 07/31/2023 0917   BILITOT 0.5 07/31/2023 0917      Component Value Date/Time   TSH 1.100 09/14/2022 1659   Nutritional Lab Results  Component Value Date   VD25OH 67.4 01/31/2024   VD25OH 46.1 07/31/2023   VD25OH 54.3 02/23/2023    Attestations:   I, Feliciano Mingle, acting as a stage manager for [employer], DO., have compiled all relevant documentation for today's office visit on behalf of Barnie Jenkins, DO, while in the presence of [employer], DO.   I have reviewed the above documentation for accuracy and completeness, and I agree with the above. Barnie JINNY Jenkins, D.O.  The 21st Century Cures Act was signed into law in 2016 which includes the topic of electronic health records.  This provides immediate access to information in MyChart.  This includes consultation notes, operative notes, office notes, lab results and pathology reports.  If you have any questions about what you read please let us  know at your next visit so we can discuss your concerns and take corrective action if need be.  We are right here with you.

## 2024-05-13 ENCOUNTER — Ambulatory Visit (INDEPENDENT_AMBULATORY_CARE_PROVIDER_SITE_OTHER): Admitting: Internal Medicine

## 2024-05-16 ENCOUNTER — Encounter (INDEPENDENT_AMBULATORY_CARE_PROVIDER_SITE_OTHER): Payer: Self-pay | Admitting: Family Medicine

## 2024-05-16 ENCOUNTER — Ambulatory Visit (INDEPENDENT_AMBULATORY_CARE_PROVIDER_SITE_OTHER): Admitting: Family Medicine

## 2024-05-16 DIAGNOSIS — K5903 Drug induced constipation: Secondary | ICD-10-CM | POA: Diagnosis not present

## 2024-05-16 DIAGNOSIS — R7303 Prediabetes: Secondary | ICD-10-CM

## 2024-05-16 DIAGNOSIS — Z6833 Body mass index (BMI) 33.0-33.9, adult: Secondary | ICD-10-CM

## 2024-05-16 DIAGNOSIS — E559 Vitamin D deficiency, unspecified: Secondary | ICD-10-CM | POA: Diagnosis not present

## 2024-05-16 DIAGNOSIS — I10 Essential (primary) hypertension: Secondary | ICD-10-CM | POA: Diagnosis not present

## 2024-05-16 DIAGNOSIS — E669 Obesity, unspecified: Secondary | ICD-10-CM

## 2024-05-16 MED ORDER — METFORMIN HCL 500 MG PO TABS: ORAL_TABLET | ORAL | 0 refills | Status: DC

## 2024-05-16 MED ORDER — VITAMIN D (ERGOCALCIFEROL) 1.25 MG (50000 UNIT) PO CAPS: 50000.0000 [IU] | ORAL_CAPSULE | ORAL | 0 refills | Status: DC

## 2024-05-16 NOTE — Progress Notes (Signed)
 Troy Peterson, D.O.  ABFM, ABOM Specializing in Clinical Bariatric Medicine  Office located at: [address] [place], [place]  [address]      A) FOR THE CHRONIC DISEASE OF OBESITY:  Chief complaint: Obesity Troy Peterson is here to discuss his progress with his obesity treatment plan.   History of present illness / Interval history:  Troy Peterson is here today for his follow-up office visit.  Since last OV on 04/22/24, pt is down 11 lbs. Patient states that he is unaware of what he is doing differently to lose weight. He endorses eating smaller meals throughout the day and increasing his workouts.    04/22/24 15:00 05/16/24 15:00   Body Fat % 32.2 % 30.7 %  Muscle Mass (lbs) 181.4 lbs 178 lbs  Fat Mass (lbs) 90.6 lbs 83 lbs  Total Body Water (lbs) 141 lbs 131.8 lbs  Visceral Fat Rating  16 15    Counseling done on how various foods will affect these numbers and how to maximize success.   Total lbs lost to date: - 82 lbs Total Fat Mass in lbs lost to date: - 74 lbs Total weight loss percentage to date: - 23.51 %    Obesity, Beginning BMI 47.25 BMI 33.0-33.9,adult -- Current BMI 33.75 Nutrition Therapy He is keeping a food journal and adhering to recommended goals of 1900 calories and 150+ g of protein  and states he is following his eating plan approximately 90 % of the time.   - Tracking Calories/Macros: yes  - Eating More Whole Foods: yes  - Adequate Protein Intake: yes  - Adequate Water Intake: yes  - Skipping Meals: no  - Sleeping 7-9 Hours/ Night: yes   Troy Peterson is currently in the action stage of change. As such, his goal is to continue weight management plan.  He has agreed to: continue current plan   Physical Activity Tim is lifting and walking 30  minutes 7 days per week   Rollan has been advised to work up to 300-450 minutes of moderate intensity aerobic activity a week and strengthening exercises 2-3 times per week for cardiovascular health,  weight loss maintenance and preservation of muscle mass.  He has agreed to : Continue current level of physical activity    Behavioral Modifications Evidence-based interventions for health behavior change were utilized today including the discussion of  1) self monitoring techniques:    - Continue journaling and tracking   2) SMART goals for next OV:    - Increase protein to avoid muscle loss   - Add a pre/post workout snack   Regarding patient's less desirable eating habits and patterns, we employed the technique of small changes.   We discussed the following today: increasing lean protein intake to established goals and continue to work on maintaining a reduced calorie state, getting the recommended amount of protein, incorporating whole foods, making healthy choices, staying well hydrated and practicing mindfulness when eating. Additional resources provided today: None   Medical Interventions/ Pharmacotherapy Previous Bariatric surgery: n/a Pharmacotherapy for weight loss: He is currently taking Metformin  1000 mg twice daily and Ozempic 0.5 mg weekly for medical weight loss.    We discussed various medication options to help [name] with his weight loss efforts and we both agreed to : Continue with current nutritional and behavioral strategies   B) OBESITY RELATED CONDITIONS ADDRESSED TODAY:  Pre-diabetes Drug induced constipation Assessment & Plan Lab Results  Component Value Date   HGBA1C 5.4 01/31/2024  HGBA1C 5.7 (H) 07/31/2023   HGBA1C 5.5 02/23/2023   INSULIN  11.1 02/23/2023   INSULIN  7.2 09/14/2022   INSULIN  7.2 12/20/2021    On Metformin  1000 mg twice daily with reported good compliance and tolerance. Patient states the he is taking his friends Ozempic. He is taking 18 clicks/ 0.5 mg weekly. Patient denies having any side effects or GI upset but states that he is not feeling as hungry. He states that once he started it was :hard to eat at all. He describes his  fullness as stopping somewhere to eat before getting home and trying to eat dinner as you got home. He states averaging 1700-1900 calories and 150-160 g of protein per day. Drinks about 8-9 bottles of water per day. Patient states that his constipation is not more than usually since starting the medication, He endorses using OTC Miralax  every morning. Since patient states that he feels like he is struggling to eat I do not recommend that patient increase his Ozempic dose. Patient states that he will stay at current dose. Continue following prudent meal plan.     Vitamin D  deficiency Assessment & Plan Lab Results  Component Value Date   VD25OH 67.4 01/31/2024   VD25OH 46.1 07/31/2023   VD25OH 54.3 02/23/2023   On ERGO 50 K units once weekly. With reported good compliance and tolerance. Patient has no acute concerns today. Continue with supplementation, will refill today.     Essential hypertension Assessment & Plan BP Readings from Last 3 Encounters:  05/16/24 108/68  04/22/24 128/81  02/21/24 116/73   The 10-year ASCVD risk score (Arnett DK, et al., 2019) is: 1.1%  Lab Results  Component Value Date   CREATININE 0.95 01/31/2024   On Hyzaar 50-12.5 mg 1 tablet daily. With reported good compliance and tolerance. Patient states that he is not checking his BP at home. He states that sometimes he feels dizzy at home when bending down and getting up. Patient is asx today. Recommended that patient check his BP at home a couple days a week. Continue with medications and if symptoms continue reach out to PCP.      Medications Discontinued During This Encounter  Medication Reason   metFORMIN  (GLUCOPHAGE ) 500 MG tablet Reorder   Vitamin D , Ergocalciferol , (DRISDOL ) 1.25 MG (50000 UNIT) CAPS capsule Reorder     Meds ordered this encounter  Medications   Vitamin D , Ergocalciferol , (DRISDOL ) 1.25 MG (50000 UNIT) CAPS capsule    Sig: Take 1 capsule (50,000 Units total) by mouth every 7  (seven) days.    Dispense:  4 capsule    Refill:  0   metFORMIN  (GLUCOPHAGE ) 500 MG tablet    Sig: 2 with brkfst and 2 po dinner    Dispense:  120 tablet    Refill:  0     Follow up:   Return 06/06/2024 at 3:40 PM  He was informed of the importance of frequent follow up visits to maximize his success with intensive lifestyle modifications for his multiple health conditions.   Weight Summary and Biometrics   Weight Lost Since Last Visit: 11 lb  Weight Gained Since Last Visit: 0   Vitals Temp: 97.7 F (36.5 C) BP: 108/68 Pulse Rate: 68 SpO2: 98 %   Anthropometric Measurements Height: 6' 3 (1.905 m) Weight: 270 lb (122.5 kg) BMI (Calculated): 33.75 Weight at Last Visit: 281 lb Weight Lost Since Last Visit: 11 lb Weight Gained Since Last Visit: 0 Starting Weight: 353 lb Total Weight Loss (lbs):  83 lb (37.6 kg)   Body Composition  Body Fat %: 30.7 % Fat Mass (lbs): 83 lbs Muscle Mass (lbs): 178 lbs Total Body Water (lbs): 131.8 lbs Visceral Fat Rating : 15   Other Clinical Data Today's Visit #: 44 Starting Date: 10/28/20    Objective:   PHYSICAL EXAM: Blood pressure 108/68, pulse 68, temperature 97.7 F (36.5 C), height 6' 3 (1.905 m), weight 270 lb (122.5 kg), SpO2 98%. Body mass index is 33.75 kg/m.  General: he is overweight, cooperative and in no acute distress. PSYCH: Has normal mood, affect and thought process.   HEENT: EOMI, sclerae are anicteric. Lungs: Normal breathing effort, no conversational dyspnea. Extremities: Moves * 4 Neurologic: A and O * 3, good insight  DIAGNOSTIC DATA REVIEWED: BMET    Component Value Date/Time   NA 138 01/31/2024 1554   K 4.1 01/31/2024 1554   CL 99 01/31/2024 1554   CO2 21 01/31/2024 1554   GLUCOSE 75 01/31/2024 1554   GLUCOSE 107 (H) 07/30/2020 1700   BUN 24 01/31/2024 1554   CREATININE 0.95 01/31/2024 1554   CALCIUM 10.1 01/31/2024 1554   GFRNONAA >60 02/08/2008 0445   GFRAA  02/08/2008 0445     >60        The eGFR has been calculated using the MDRD equation. This calculation has not been validated in all clinical   Lab Results  Component Value Date   HGBA1C 5.4 01/31/2024   HGBA1C 6.0 (H) 10/28/2020   Lab Results  Component Value Date   INSULIN  11.1 02/23/2023   INSULIN  17.7 10/28/2020   Lab Results  Component Value Date   TSH 1.100 09/14/2022   CBC    Component Value Date/Time   WBC 11.0 (H) 07/30/2020 1700   RBC 4.97 07/30/2020 1700   HGB 13.7 07/30/2020 1700   HCT 41.4 07/30/2020 1700   PLT 256.0 07/30/2020 1700   MCV 83.3 07/30/2020 1700   MCHC 33.1 07/30/2020 1700   RDW 13.9 07/30/2020 1700   Iron Studies No results found for: IRON, TIBC, FERRITIN, IRONPCTSAT Lipid Panel     Component Value Date/Time   CHOL 137 02/23/2023 0911   TRIG 97 02/23/2023 0911   HDL 42 02/23/2023 0911   CHOLHDL 3.3 02/23/2023 0911   CHOLHDL 4 07/30/2020 1700   VLDL 16.6 07/30/2020 1700   LDLCALC 77 02/23/2023 0911   LDLDIRECT 102.0 07/25/2016 1600   Hepatic Function Panel     Component Value Date/Time   PROT 7.0 07/31/2023 0917   ALBUMIN 4.5 07/31/2023 0917   AST 20 07/31/2023 0917   ALT 19 07/31/2023 0917   ALKPHOS 76 07/31/2023 0917   BILITOT 0.5 07/31/2023 0917      Component Value Date/Time   TSH 1.100 09/14/2022 1659   Nutritional Lab Results  Component Value Date   VD25OH 67.4 01/31/2024   VD25OH 46.1 07/31/2023   VD25OH 54.3 02/23/2023    Attestations:   I, Sonny Laroche, acting as a stage manager for Troy Jenkins, DO., have compiled all relevant documentation for today's office visit on behalf of Troy Jenkins, DO, while in the presence of [employer], DO.   I have reviewed the above documentation for accuracy and completeness, and I agree with the above. Troy JINNY Peterson, D.O.  The 21st Century Cures Act was signed into law in 2016 which includes the topic of electronic health records.  This provides immediate access to  information in MyChart.  This includes consultation notes, operative notes, office  notes, lab results and pathology reports.  If you have any questions about what you read please let us  know at your next visit so we can discuss your concerns and take corrective action if need be.  We are right here with you.

## 2024-06-06 ENCOUNTER — Ambulatory Visit (INDEPENDENT_AMBULATORY_CARE_PROVIDER_SITE_OTHER): Admitting: Family Medicine

## 2024-07-10 ENCOUNTER — Ambulatory Visit (INDEPENDENT_AMBULATORY_CARE_PROVIDER_SITE_OTHER): Admitting: Family Medicine

## 2024-07-10 ENCOUNTER — Encounter (INDEPENDENT_AMBULATORY_CARE_PROVIDER_SITE_OTHER): Payer: Self-pay | Admitting: Family Medicine

## 2024-07-10 DIAGNOSIS — I10 Essential (primary) hypertension: Secondary | ICD-10-CM

## 2024-07-10 DIAGNOSIS — E669 Obesity, unspecified: Secondary | ICD-10-CM

## 2024-07-10 DIAGNOSIS — E559 Vitamin D deficiency, unspecified: Secondary | ICD-10-CM | POA: Diagnosis not present

## 2024-07-10 DIAGNOSIS — Z6833 Body mass index (BMI) 33.0-33.9, adult: Secondary | ICD-10-CM

## 2024-07-10 DIAGNOSIS — R7303 Prediabetes: Secondary | ICD-10-CM

## 2024-07-10 MED ORDER — METFORMIN HCL 500 MG PO TABS: ORAL_TABLET | ORAL | 0 refills | Status: AC

## 2024-07-10 MED ORDER — VITAMIN D (ERGOCALCIFEROL) 1.25 MG (50000 UNIT) PO CAPS: 50000.0000 [IU] | ORAL_CAPSULE | ORAL | 0 refills | Status: AC

## 2024-07-10 NOTE — Progress Notes (Signed)
 "  Troy Peterson, D.O.  ABFM, ABOM Specializing in Clinical Bariatric Medicine  Office located at: [address] [place], [place]  [address]      Orders Placed This Encounter  Procedures   VITAMIN D  25 Hydroxy (Vit-D Deficiency, Fractures)   Hemoglobin A1c   Comprehensive metabolic panel with GFR    Medications Discontinued During This Encounter  Medication Reason   Vitamin D , Ergocalciferol , (DRISDOL ) 1.25 MG (50000 UNIT) CAPS capsule Reorder   metFORMIN  (GLUCOPHAGE ) 500 MG tablet Reorder     Meds ordered this encounter  Medications   metFORMIN  (GLUCOPHAGE ) 500 MG tablet    Sig: 2 with brkfst and 2 po dinner    Dispense:  360 tablet    Refill:  0   Vitamin D , Ergocalciferol , (DRISDOL ) 1.25 MG (50000 UNIT) CAPS capsule    Sig: Take 1 capsule (50,000 Units total) by mouth every 7 (seven) days.    Dispense:  12 capsule    Refill:  0      A) FOR THE CHRONIC DISEASE OF OBESITY:  Obesity, Beginning BMI 47.25 BMI 33.0-33.9,adult -- Current BMI 33.75  Chief complaint: Obesity Troy Peterson is here to discuss his progress with his obesity treatment plan.   History of present illness / Interval history:  Troy Peterson is here today for his follow-up office visit.  Since last OV on 05/16/2024, pt is up 4 lbs.   He reports he finds it difficult to adhere to the 1900 calories.  Food recall: Breakfast: 4 eggs and 2 pieces of turkey sausage  Snack(9:00 am): apple and mixed nuts Snack(12:00 pm): [employer] greek yogurt Lunch: 8oz of grilled chicken Dinner: sirloin steak, sweet potatoes, and green beans   05/16/24 15:00 07/10/24 15:00   Body Fat % 30.7 % 31.6 %  Muscle Mass (lbs) 178 lbs 178.8 lbs  Fat Mass (lbs) 83 lbs 86.8 lbs  Total Body Water (lbs) 131.8 lbs 135.2 lbs  Visceral Fat Rating  15 16  Counseling done on how various foods will affect these numbers and how to maximize success   Total lbs lost to date: -79 lbs Total Fat Mass in lbs lost to date: -70.2 Total  weight loss percentage to date: -22.38 %   Nutrition Therapy He is  keeping a food journal and adhering to recommended goals of 1900 calories and 150+ g of protein and states he is following his eating plan approximately 70 % of the time.   - Tracking Calories/Macros: yes  - Eating More Whole Foods: yes  - Adequate Protein Intake: yes  - Adequate Water Intake: yes  - Skipping Meals: no  - Sleeping 7-9 Hours/ Night: yes   Troy Peterson is currently in the action stage of change. As such, his goal is to continue weight management plan.  He has agreed to: continue current plan   Physical Activity Pt is doing weights and cardio 90+  minutes 5 days per week   Brace has been advised to work up to 300-450 minutes of moderate intensity aerobic activity a week and strengthening exercises 2-3 times per week for cardiovascular health, weight loss maintenance and preservation of muscle mass.  He has agreed to : Continue current level of physical activity    Behavioral Modifications Evidence-based interventions for health behavior change were utilized today including the discussion of  1) self monitoring techniques:  Journaling 2) self care:  exercise  Regarding patient's less desirable eating habits and patterns, we employed the technique of small changes.  We discussed the following today: increasing vegetables, increasing lower glycemic fruits, increasing fiber rich foods, continue to work on implementation of reduced calorie nutritional plan, and continue to practice mindfulness when eating Additional resources provided today: Handout on balanced plate concepts.     Medical Interventions/ Pharmacotherapy Previous Bariatric surgery: n/a Pharmacotherapy for weight loss: He is currently taking Metformin  1,000 mg twice daily and Ozempic 0.5 mg weekly for medical weight loss.    Pt is taking friends Ozempic 0.5 mg once weekly. Doing well on medication. Denies side effects. I advised pt to  increase to 1 mg once wekkly.   We discussed various medication options to help Troy Peterson with his weight loss efforts and we both agreed to : Continue with current nutritional and behavioral strategies and Increase Ozempic to 1 mg once weekly.    B) OBESITY RELATED CONDITIONS ADDRESSED TODAY:  RECHECK VIT D, A1c, BMP TODAY  Pre-diabetes Assessment & Plan Lab Results  Component Value Date   HGBA1C 5.4 01/31/2024   HGBA1C 5.7 (H) 07/31/2023   HGBA1C 5.5 02/23/2023   INSULIN  11.1 02/23/2023   INSULIN  7.2 09/14/2022   INSULIN  7.2 12/20/2021  On Metformin  1000 mg twice daily and is taking his friends Ozempic 0.5 mg weekly with good compliance and tolerance. Cravings are well controlled. Pt reports hunger is decreased, but he stills adheres to his caloric and protein intake. Pt is doing well on Ozempic 0.5 mg once weekly and reports no side effects. I advised pt to increase dose to Ozempic once weekly as tolerated to maximize weight loss benefits. I educated pt that the increase dose could decrease hunger and it is important pt adheres to MP. Pt is aware of all side effects.  Cont medication(refill today). Cont decreasing simple carbs and increasing lean proteins. Cont PNP. Recheck A1c today.    Essential hypertension Assessment & Plan Last 3 blood pressure readings in our office are as follows: BP Readings from Last 3 Encounters:  07/10/24 121/65  05/16/24 108/68  04/22/24 128/81   The 10-year ASCVD risk score (Arnett DK, et al., 2019) is: 1.4%  Lab Results  Component Value Date   CREATININE 0.95 01/31/2024  Currently on Losartan -hydrochlorothiazide 50-12.5 mg once daily with good compliance and tolerance. BP is well controlled. Pt asx. No acute concerns. Cont medication. Cont low-sodium, heart-healthy diet.     Vitamin D  deficiency Assessment & Plan Lab Results  Component Value Date   VD25OH 67.4 01/31/2024   VD25OH 46.1 07/31/2023   VD25OH 54.3 02/23/2023  Currently on Ergo  50K units once weekly with good compliance and tolerance. Cont regimen(refill today). Recheck levels today.     Medications Discontinued During This Encounter  Medication Reason   Vitamin D , Ergocalciferol , (DRISDOL ) 1.25 MG (50000 UNIT) CAPS capsule Reorder   metFORMIN  (GLUCOPHAGE ) 500 MG tablet Reorder     Meds ordered this encounter  Medications   metFORMIN  (GLUCOPHAGE ) 500 MG tablet    Sig: 2 with brkfst and 2 po dinner    Dispense:  360 tablet    Refill:  0   Vitamin D , Ergocalciferol , (DRISDOL ) 1.25 MG (50000 UNIT) CAPS capsule    Sig: Take 1 capsule (50,000 Units total) by mouth every 7 (seven) days.    Dispense:  12 capsule    Refill:  0      Follow up:   Return 09/04/2024 3:40 PM.  He was informed of the importance of frequent follow up visits to maximize his success with intensive lifestyle modifications for  his multiple health conditions.  Troy Peterson is aware that we will review all of his lab results at our next visit.  He is aware that if anything is critical/ life threatening with the results, we will be contacting him via MyChart prior to the office visit to discuss management.     Weight Summary and Biometrics   Weight Lost Since Last Visit: 0lb  Weight Gained Since Last Visit: 4lb    Vitals Temp: 97.7 F (36.5 C) BP: 121/65 Pulse Rate: 66 SpO2: 97 %   Anthropometric Measurements Height: 6' 3 (1.905 m) Weight: 274 lb (124.3 kg) BMI (Calculated): 34.25 Weight at Last Visit: 270lb Weight Lost Since Last Visit: 0lb Weight Gained Since Last Visit: 4lb Starting Weight: 353lb Total Weight Loss (lbs): 79 lb (35.8 kg)   Body Composition  Body Fat %: 31.6 % Fat Mass (lbs): 86.8 lbs Muscle Mass (lbs): 178.8 lbs Total Body Water (lbs): 135.2 lbs Visceral Fat Rating : 16   Other Clinical Data Fasting: no Labs: no Today's Visit #: 45 Starting Date: 10/28/20    Objective:   PHYSICAL EXAM: Blood pressure 121/65, pulse 66, temperature  97.7 F (36.5 C), height 6' 3 (1.905 m), weight 274 lb (124.3 kg), SpO2 97%. Body mass index is 34.25 kg/m.  General: he is overweight, cooperative and in no acute distress. PSYCH: Has normal mood, affect and thought process.   HEENT: EOMI, sclerae are anicteric. Lungs: Normal breathing effort, no conversational dyspnea. Extremities: Moves * 4 Neurologic: A and O * 3, good insight  DIAGNOSTIC DATA REVIEWED: BMET    Component Value Date/Time   NA 138 01/31/2024 1554   K 4.1 01/31/2024 1554   CL 99 01/31/2024 1554   CO2 21 01/31/2024 1554   GLUCOSE 75 01/31/2024 1554   GLUCOSE 107 (H) 07/30/2020 1700   BUN 24 01/31/2024 1554   CREATININE 0.95 01/31/2024 1554   CALCIUM 10.1 01/31/2024 1554   GFRNONAA >60 02/08/2008 0445   GFRAA  02/08/2008 0445    >60        The eGFR has been calculated using the MDRD equation. This calculation has not been validated in all clinical   Lab Results  Component Value Date   HGBA1C 5.4 01/31/2024   HGBA1C 6.0 (H) 10/28/2020   Lab Results  Component Value Date   INSULIN  11.1 02/23/2023   INSULIN  17.7 10/28/2020   Lab Results  Component Value Date   TSH 1.100 09/14/2022   CBC    Component Value Date/Time   WBC 11.0 (H) 07/30/2020 1700   RBC 4.97 07/30/2020 1700   HGB 13.7 07/30/2020 1700   HCT 41.4 07/30/2020 1700   PLT 256.0 07/30/2020 1700   MCV 83.3 07/30/2020 1700   MCHC 33.1 07/30/2020 1700   RDW 13.9 07/30/2020 1700   Iron Studies No results found for: IRON, TIBC, FERRITIN, IRONPCTSAT Lipid Panel     Component Value Date/Time   CHOL 137 02/23/2023 0911   TRIG 97 02/23/2023 0911   HDL 42 02/23/2023 0911   CHOLHDL 3.3 02/23/2023 0911   CHOLHDL 4 07/30/2020 1700   VLDL 16.6 07/30/2020 1700   LDLCALC 77 02/23/2023 0911   LDLDIRECT 102.0 07/25/2016 1600   Hepatic Function Panel     Component Value Date/Time   PROT 7.0 07/31/2023 0917   ALBUMIN 4.5 07/31/2023 0917   AST 20 07/31/2023 0917   ALT 19  07/31/2023 0917   ALKPHOS 76 07/31/2023 0917   BILITOT 0.5 07/31/2023 0917  Component Value Date/Time   TSH 1.100 09/14/2022 1659   Nutritional Lab Results  Component Value Date   VD25OH 67.4 01/31/2024   VD25OH 46.1 07/31/2023   VD25OH 54.3 02/23/2023    Attestations:   I, Feliciano Mingle, acting as a stage manager for Troy Jenkins, DO., have compiled all relevant documentation for today's office visit on behalf of Troy Jenkins, DO, while in the presence of [employer], DO.   I have spent 42 minutes in the care of the patient today including: 1- preparing to see patient (e.g. review and interpretation of tests, old notes ) 2- performing a medically appropriate examination or evaluation,  3- documenting clinical information in the electronic or other health care record and ordering tests/ meds etc, 4- independently interpreting results of previous labs and communicating results to the patient, family, or caregiver  5- largest amount of time spent counseling and educating the patient on nutritional counseling and strategies for improved successful outcomes (separate 14+ minutes alone on this) I have reviewed the above documentation for accuracy and completeness, and I agree with the above. Troy JINNY Peterson, D.O.  The 21st Century Cures Act was signed into law in 2016 which includes the topic of electronic health records.  This provides immediate access to information in MyChart.  This includes consultation notes, operative notes, office notes, lab results and pathology reports.  If you have any questions about what you read please let us  know at your next visit so we can discuss your concerns and take corrective action if need be.  We are right here with you.  "

## 2024-07-11 LAB — COMPREHENSIVE METABOLIC PANEL WITH GFR
ALT: 40 IU/L (ref 0–44)
AST: 54 IU/L — ABNORMAL HIGH (ref 0–40)
Albumin: 4.7 g/dL (ref 4.1–5.1)
Alkaline Phosphatase: 77 IU/L (ref 47–123)
BUN/Creatinine Ratio: 13 (ref 9–20)
BUN: 19 mg/dL (ref 6–24)
Bilirubin Total: 0.7 mg/dL (ref 0.0–1.2)
CO2: 23 mmol/L (ref 20–29)
Calcium: 9.6 mg/dL (ref 8.7–10.2)
Chloride: 100 mmol/L (ref 96–106)
Creatinine, Ser: 1.45 mg/dL — ABNORMAL HIGH (ref 0.76–1.27)
Globulin, Total: 2.5 g/dL (ref 1.5–4.5)
Glucose: 85 mg/dL (ref 70–99)
Potassium: 5 mmol/L (ref 3.5–5.2)
Sodium: 140 mmol/L (ref 134–144)
Total Protein: 7.2 g/dL (ref 6.0–8.5)
eGFR: 61 mL/min/1.73

## 2024-07-11 LAB — VITAMIN D 25 HYDROXY (VIT D DEFICIENCY, FRACTURES): Vit D, 25-Hydroxy: 39.3 ng/mL (ref 30.0–100.0)

## 2024-07-11 LAB — HEMOGLOBIN A1C
Est. average glucose Bld gHb Est-mCnc: 111 mg/dL
Hgb A1c MFr Bld: 5.5 % (ref 4.8–5.6)

## 2024-07-15 ENCOUNTER — Ambulatory Visit: Payer: Self-pay

## 2024-07-15 DIAGNOSIS — I10 Essential (primary) hypertension: Secondary | ICD-10-CM

## 2024-07-15 MED ORDER — LOSARTAN POTASSIUM-HCTZ 50-12.5 MG PO TABS: 0.5000 | ORAL_TABLET | Freq: Every day | ORAL | Status: AC

## 2024-07-15 NOTE — Telephone Encounter (Signed)
 Please update patient.  In the meantime, would cut losartan  hydrochlorothiazide in half and take 1/2 tab a day.  Drink enough water to keep urine clear.  We can recheck at the OV.  Thanks.

## 2024-07-15 NOTE — Telephone Encounter (Signed)
 Pt wants to be reevaluated for BP and be advised on whether or not to dc BP meds following significant weight loss  FYI Only or Action Required?: FYI only for provider: appointment scheduled on 1/27.  Patient was last seen in primary care on 07/10/2024 by Midge Sober, DO.  Called Nurse Triage reporting Medication Problem.  Triage Disposition: Call PCP When Office is Open  Patient/caregiver understands and will follow disposition?: Yes   Summary: Dizziness   Reason for Triage:taking bp medication with fluid pill attach.. labs completed recently and shows concerns of dehydration from weigh loss provider.. intermittent dizziness .. would like to discontinue fluid pill         Reason for Disposition  [1] Caller requesting NON-URGENT health information AND [2] PCP's office is the best resource  Answer Assessment - Initial Assessment Questions 1. REASON FOR CALL: What is the main reason for your call? or How can I best help you?     Pt wants to be reevaluated for his blood pressure, unsure if he still needs to be taking BP meds now that he has undergone significant weight loss  2. SYMPTOMS : Do you have any symptoms?      Denies  3. OTHER QUESTIONS: Do you have any other questions?     Denies  Protocols used: Information Only Call - No Triage-A-AH

## 2024-07-23 ENCOUNTER — Ambulatory Visit: Admitting: Family Medicine

## 2024-07-30 ENCOUNTER — Encounter: Payer: Self-pay | Admitting: Family Medicine

## 2024-07-30 ENCOUNTER — Ambulatory Visit: Admitting: Family Medicine

## 2024-07-30 VITALS — BP 118/82 | HR 61 | Temp 98.6°F | Ht 75.0 in | Wt 289.0 lb

## 2024-07-30 DIAGNOSIS — I10 Essential (primary) hypertension: Secondary | ICD-10-CM

## 2024-07-30 NOTE — Patient Instructions (Addendum)
 Keep taking 1/2 tab losartan  hydrochlorothiazide for now.   If you continue to have lightheadedness, then stop losartan  hydrochlorothiazide totally and let me know.    If your BP is going back above 140/90 while off meds, we could change your medicine to plain losartan  25mg .   We'll update you about your labs.    Thanks for your effort.  Take care.  Glad to see you.

## 2024-07-31 ENCOUNTER — Ambulatory Visit (INDEPENDENT_AMBULATORY_CARE_PROVIDER_SITE_OTHER): Admitting: Family Medicine

## 2024-07-31 LAB — COMPREHENSIVE METABOLIC PANEL WITH GFR
ALT: 22 U/L (ref 3–53)
AST: 21 U/L (ref 5–37)
Albumin: 4.3 g/dL (ref 3.5–5.2)
Alkaline Phosphatase: 61 U/L (ref 39–117)
BUN: 19 mg/dL (ref 6–23)
CO2: 30 meq/L (ref 19–32)
Calcium: 9.2 mg/dL (ref 8.4–10.5)
Chloride: 102 meq/L (ref 96–112)
Creatinine, Ser: 0.84 mg/dL (ref 0.40–1.50)
GFR: 105.37 mL/min
Glucose, Bld: 85 mg/dL (ref 70–99)
Potassium: 4.2 meq/L (ref 3.5–5.1)
Sodium: 138 meq/L (ref 135–145)
Total Bilirubin: 0.5 mg/dL (ref 0.2–1.2)
Total Protein: 6.9 g/dL (ref 6.0–8.3)

## 2024-07-31 NOTE — Assessment & Plan Note (Signed)
 Likely had overtreated blood pressure initially given his weight loss.  He feels better on the lower dose of losartan  hydrochlorothiazide now.  Recheck labs pending  He can keep taking 1/2 tab losartan  hydrochlorothiazide for now.   If continuing to have lightheadedness, then stop losartan  hydrochlorothiazide totally and let me know.    If BP is going back above 140/90 while off meds, we could change medicine to plain losartan  25mg .

## 2024-08-01 ENCOUNTER — Ambulatory Visit: Payer: Self-pay | Admitting: Family Medicine

## 2024-09-04 ENCOUNTER — Ambulatory Visit (INDEPENDENT_AMBULATORY_CARE_PROVIDER_SITE_OTHER): Admitting: Family Medicine
